# Patient Record
Sex: Female | Born: 1985 | Hispanic: Yes | Marital: Married | Smoking: Former smoker
Health system: Southern US, Community
[De-identification: ages and names within clinical notes are randomized; demographics above are authoritative.]

## PROBLEM LIST (undated history)

## (undated) ENCOUNTER — Inpatient Hospital Stay (HOSPITAL_COMMUNITY): Payer: Self-pay

## (undated) DIAGNOSIS — J45909 Unspecified asthma, uncomplicated: Secondary | ICD-10-CM

## (undated) DIAGNOSIS — I10 Essential (primary) hypertension: Secondary | ICD-10-CM

## (undated) DIAGNOSIS — R51 Headache: Secondary | ICD-10-CM

## (undated) DIAGNOSIS — G2581 Restless legs syndrome: Secondary | ICD-10-CM

## (undated) DIAGNOSIS — D649 Anemia, unspecified: Secondary | ICD-10-CM

## (undated) DIAGNOSIS — F329 Major depressive disorder, single episode, unspecified: Secondary | ICD-10-CM

## (undated) DIAGNOSIS — F419 Anxiety disorder, unspecified: Secondary | ICD-10-CM

## (undated) DIAGNOSIS — R87629 Unspecified abnormal cytological findings in specimens from vagina: Secondary | ICD-10-CM

## (undated) DIAGNOSIS — Z789 Other specified health status: Secondary | ICD-10-CM

## (undated) DIAGNOSIS — T8332XA Displacement of intrauterine contraceptive device, initial encounter: Secondary | ICD-10-CM

## (undated) DIAGNOSIS — J189 Pneumonia, unspecified organism: Secondary | ICD-10-CM

## (undated) DIAGNOSIS — R519 Headache, unspecified: Secondary | ICD-10-CM

## (undated) DIAGNOSIS — F32A Depression, unspecified: Secondary | ICD-10-CM

## (undated) DIAGNOSIS — K219 Gastro-esophageal reflux disease without esophagitis: Secondary | ICD-10-CM

## (undated) HISTORY — DX: Essential (primary) hypertension: I10

## (undated) HISTORY — PX: APPENDECTOMY: SHX54

## (undated) HISTORY — PX: LAPAROSCOPY ABDOMEN DIAGNOSTIC: PRO50

## (undated) HISTORY — PX: IUD REMOVAL: SHX5392

## (undated) HISTORY — DX: Anemia, unspecified: D64.9

## (undated) HISTORY — DX: Headache, unspecified: R51.9

## (undated) HISTORY — DX: Other specified health status: Z78.9

## (undated) HISTORY — PX: COLPOSCOPY W/ BIOPSY / CURETTAGE: SUR283

## (undated) HISTORY — DX: Unspecified abnormal cytological findings in specimens from vagina: R87.629

## (undated) HISTORY — DX: Unspecified asthma, uncomplicated: J45.909

## (undated) HISTORY — DX: Headache: R51

## (undated) HISTORY — PX: NO PAST SURGERIES: SHX2092

---
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
## 2006-11-06 ENCOUNTER — Ambulatory Visit (HOSPITAL_COMMUNITY): Admission: RE | Admit: 2006-11-06 | Discharge: 2006-11-06 | Payer: Self-pay | Admitting: Obstetrics and Gynecology

## 2008-10-05 ENCOUNTER — Other Ambulatory Visit: Admission: RE | Admit: 2008-10-05 | Discharge: 2008-10-05 | Payer: Self-pay | Admitting: Obstetrics and Gynecology

## 2009-04-14 ENCOUNTER — Inpatient Hospital Stay (HOSPITAL_COMMUNITY): Admission: AD | Admit: 2009-04-14 | Discharge: 2009-04-15 | Payer: Self-pay | Admitting: Obstetrics & Gynecology

## 2009-04-22 ENCOUNTER — Ambulatory Visit: Payer: Self-pay | Admitting: Obstetrics & Gynecology

## 2009-04-22 ENCOUNTER — Inpatient Hospital Stay (HOSPITAL_COMMUNITY): Admission: AD | Admit: 2009-04-22 | Discharge: 2009-04-22 | Payer: Self-pay | Admitting: Obstetrics & Gynecology

## 2009-04-23 ENCOUNTER — Ambulatory Visit: Payer: Self-pay | Admitting: Advanced Practice Midwife

## 2009-04-23 ENCOUNTER — Inpatient Hospital Stay (HOSPITAL_COMMUNITY): Admission: AD | Admit: 2009-04-23 | Discharge: 2009-04-24 | Payer: Self-pay | Admitting: Obstetrics & Gynecology

## 2010-01-26 ENCOUNTER — Emergency Department (HOSPITAL_COMMUNITY): Admission: EM | Admit: 2010-01-26 | Discharge: 2010-01-26 | Payer: Self-pay | Admitting: Emergency Medicine

## 2010-09-12 LAB — CBC
HCT: 35.3 % — ABNORMAL LOW (ref 36.0–46.0)
Hemoglobin: 11.7 g/dL — ABNORMAL LOW (ref 12.0–15.0)
MCHC: 33.1 g/dL (ref 30.0–36.0)
MCV: 82.7 fL (ref 78.0–100.0)
Platelets: 217 10*3/uL (ref 150–400)
RBC: 4.27 MIL/uL (ref 3.87–5.11)
RDW: 14.6 % (ref 11.5–15.5)
WBC: 13.7 10*3/uL — ABNORMAL HIGH (ref 4.0–10.5)

## 2010-09-12 LAB — RPR: RPR Ser Ql: NONREACTIVE

## 2010-10-26 NOTE — Op Note (Signed)
NAMEEDLA, PARA NO.:  [id number]   MEDICAL RECORD NO.:  [id number]          PATIENT TYPE:  INP   LOCATION:  LDR1                          FACILITY:  [facility]   PHYSICIAN:  Tilda Burrow, M.D. DATE OF BIRTH:  1985/06/11   DATE OF PROCEDURE:  DATE OF DISCHARGE:                                  PROCEDURE NOTE   DELIVERY SUMMARY:   ONSET OF LABOR:  May 16, 2004 at 9 a.m.   DATE OF DELIVERY:  May 16, 2004 at 1916 p.m.   LENGTH OF FIRST STAGE LABOR:  9 hours and 50 minutes.   LENGTH OF SECOND STAGE LABOR:  26 minutes.   LENGTH OF THIRD STAGE LABOR:  5 minutes.   DELIVERY NOTE:  Coralyn had a spontaneous vaginal delivery of a viable  female infant over an intact perineum.  Upon delivery of head, the infant's  shoulders were easily delivered.  Upon delivery of infant, the infant was  thoroughly suctioned, the cord clamped and cut, and the infant dried and  placed on the mother's abdomen.  Strong tone.  Movement of all extremities.  Pink color noted.  Upon inspection, there was a right labial laceration  which required 1 stitch for hemostasis.  1% lidocaine 2cc was used as  infiltrate.  2-0 Vicryl was used for the repair.  The third stage of labor  was actively managed with 1000 cc of D-5 LR at a rapid rate.  The placenta  was delivered spontaneously via Tomasa Blase mechanism.  A three vessel cord was  noted upon inspection, and membranes were noted to be intact upon  inspection.  Estimated blood loss was 300 cc.  Infant and mother were both  stabilized and transported up to the postpartum unit in stable condition.     Darl   DL/MEDQ  D:  09/81/1914  T:  05/16/2004  Job:  [id number]   cc:   [facility]

## 2010-10-26 NOTE — H&P (Signed)
[name], STEUART NO.:  [id number]   MEDICAL RECORD NO.:  [id number]          PATIENT TYPE:  INP   LOCATION:  A402                          FACILITY:  [facility]   PHYSICIAN:  Tilda Burrow, M.D. DATE OF BIRTH:  1986-05-04   DATE OF ADMISSION:  DATE OF DISCHARGE:  LH                                HISTORY & PHYSICAL   REASON FOR ADMISSION:  Pregnancy at 37 weeks and 5 days in early labor.   HISTORY OF PRESENT ILLNESS:  Andrea Burns is a 25 year old gravida 1 who presents  to the office this morning with spotting and cramping since early a.m. On  the prior exam of December 5, she was closed, long. She is 4, 70, -1.   PAST MEDICAL HISTORY:  Negative.   PAST SURGICAL HISTORY:  Negative.   ALLERGIES:  She has no known allergies.   PHYSICAL EXAMINATION:  Weight 171, blood pressure 118/80. Cervic is 4 cm,  70% effaced, -1 station. Fetal heart rate is 130, strong, and regular.   Prenatal course has been uneventful. Blood type is A positive. Rubella is  immune. Hepatitis B surface antigen negative. HIV negative. HSV is negative  for HSV-2. Serology is nonreactive. GC and Chlamydia is negative. MSAFP is  normal. GBS is negative. Twenty-week hemoglobin 12.3, 28-week hematocrit  37.2.   PLAN:  We are going to admit and expect vaginal delivery.     Darl   DL/MEDQ  D:  16/03/9603  T:  05/16/2004  Job:  [id number]

## 2012-06-10 NOTE — L&D Delivery Note (Signed)
Delivery Note At 8:06 AM a viable female was delivered via Vaginal, Spontaneous Delivery (Presentation: Right Occiput Anterior).  APGAR: 9, 9 .   Placenta status: Intact, Spontaneous.  Cord: 3 vessels with the following complications: none .   Anesthesia: Local  Episiotomy: none Lacerations: Labial Suture Repair: 3.0 vicryl rapide Est. Blood Loss (mL): 200 ml  Mom to postpartum.  Baby to Couplet care / Skin to Skin.  JACKSON-MOORE,Jeronda Don A 05/10/2013, 8:37 AM

## 2012-06-19 ENCOUNTER — Encounter (HOSPITAL_COMMUNITY): Payer: Self-pay | Admitting: *Deleted

## 2012-06-19 ENCOUNTER — Emergency Department (HOSPITAL_COMMUNITY)
Admission: EM | Admit: 2012-06-19 | Discharge: 2012-06-20 | Disposition: A | Discharge status: Home / Self Care | Payer: Medicaid Other | Attending: Emergency Medicine | Admitting: Emergency Medicine

## 2012-06-19 ENCOUNTER — Emergency Department (HOSPITAL_COMMUNITY): Payer: Medicaid Other

## 2012-06-19 DIAGNOSIS — J020 Streptococcal pharyngitis: Secondary | ICD-10-CM

## 2012-06-19 DIAGNOSIS — R059 Cough, unspecified: Secondary | ICD-10-CM | POA: Insufficient documentation

## 2012-06-19 DIAGNOSIS — R05 Cough: Secondary | ICD-10-CM | POA: Insufficient documentation

## 2012-06-19 LAB — RAPID STREP SCREEN (MED CTR MEBANE ONLY): Streptococcus, Group A Screen (Direct): POSITIVE — AB

## 2012-06-19 MED ORDER — ACETAMINOPHEN 325 MG PO TABS
650.0000 mg | ORAL_TABLET | Freq: Once | ORAL | Status: AC
  Administered 2012-06-19: 650 mg via ORAL

## 2012-06-19 MED ORDER — SODIUM CHLORIDE 0.9 % IV BOLUS (SEPSIS): 1000.0000 mL | Freq: Once | INTRAVENOUS | Status: DC

## 2012-06-19 MED ORDER — HYDROCODONE-ACETAMINOPHEN 7.5-500 MG/15ML PO SOLN: 15.0000 mL | Freq: Four times a day (QID) | ORAL | Status: DC | PRN

## 2012-06-19 MED ORDER — PENICILLIN G BENZATHINE 1200000 UNIT/2ML IM SUSP
1.2000 10*6.[IU] | Freq: Once | INTRAMUSCULAR | Status: AC
  Administered 2012-06-20: 1.2 10*6.[IU] via INTRAMUSCULAR
  Filled 2012-06-19: qty 2

## 2012-06-19 MED ORDER — HYDROCODONE-ACETAMINOPHEN 7.5-500 MG/15ML PO SOLN: 10.0000 mL | Freq: Once | ORAL | Status: DC

## 2012-06-19 NOTE — ED Provider Notes (Signed)
History     CSN: [id number]  Arrival date & time 06/19/12  1913   First MD Initiated Contact with Patient 06/19/12 2215      Chief Complaint  Patient presents with  . ill for 2-3 weeks     (Consider location/radiation/quality/duration/timing/severity/associated sxs/prior treatment) HPI  27 year old female presents complaining of fever and cough.  Patient reports for the past 2 weeks she has had persistent cough productive with green sputum. Onset was gradual, persistent, moderate in severity, accompanied with sore throat, fever, chills, and decrease in appetite. She reports no headache, sneezing, runny nose, ear pain, neck pain, shortness of breath, nausea, vomiting, diarrhea, abdominal pain, dysuria, or rash. She has tried over-the-counter cough medication without relief. She endorse pleuritic chest pain from persistent coughing but denies hemoptysis. Patient is a nonsmoker, does not take birth control, no prior history of PE or DVT, no recent surgery, prolonged bed rest, or recent travel. No medication changes.  History reviewed. No pertinent past medical history.  History reviewed. No pertinent past surgical history.  No family history on file.  History  Substance Use Topics  . Smoking status: Never Smoker   . Smokeless tobacco: Not on file  . Alcohol Use: Yes    OB History    Grav Para Term Preterm Abortions TAB SAB Ect Mult Living                  Review of Systems  Constitutional:       10 Systems reviewed and all are negative for acute change except as noted in the HPI.     Allergies  Review of patient's allergies indicates no known allergies.  Home Medications  No current outpatient prescriptions on file.  BP 114/74  Pulse 103  Temp 98.9 F (37.2 C) (Oral)  Resp 20  SpO2 97%  LMP 05/24/2012  Physical Exam  Nursing note and vitals reviewed. Constitutional: She appears well-developed and well-nourished. No distress.       Awake, alert, nontoxic  appearance  HENT:  Head: Atraumatic.  Right Ear: Tympanic membrane and ear canal normal.  Left Ear: Tympanic membrane and ear canal normal.  Nose: Nose normal.  Mouth/Throat: Uvula is midline. Oropharyngeal exudate, posterior oropharyngeal edema and posterior oropharyngeal erythema present. No tonsillar abscesses.  Eyes: Conjunctivae normal are normal. Right eye exhibits no discharge. Left eye exhibits no discharge.  Neck: Neck supple.  Cardiovascular: Normal rate and regular rhythm.   Pulmonary/Chest: Effort normal. No respiratory distress. She exhibits no tenderness.  Abdominal: Soft. There is no tenderness. There is no rebound.  Musculoskeletal: She exhibits no tenderness.       ROM appears intact, no obvious focal weakness  Neurological:       Mental status and motor strength appears intact  Skin: No rash noted.  Psychiatric: She has a normal mood and affect.    ED Course  Procedures (including critical care time)  Results for orders placed during the hospital encounter of 06/19/12  RAPID STREP SCREEN      Component Value Range   Streptococcus, Group A Screen (Direct) POSITIVE (*) NEGATIVE   Dg Chest 2 View  06/19/2012  *RADIOLOGY REPORT*  Clinical Data: Cough for 2 weeks  CHEST - 2 VIEW  Comparison: None  Findings: The heart size and mediastinal contours are within normal limits.  Both lungs are clear.  The visualized skeletal structures are unremarkable.  IMPRESSION: Negative exam.   Original Report Authenticated By: Signa Kell, M.D.  1. Strep pharyngitis  MDM  Patient presents with fever, cough, sore throat. She is otherwise appeared nontoxic and in no acute respiratory distress. She has a temperature of 102.8. Tylenol given. She has an elevated heart rate of 119. Likely secondary to a fever. She also has decreased appetite. Will give IV fluid. Her lung exam is unremarkable. Abdomen is soft and nontender. Throat with post oral pharyngeal erythema and exudate. No  evidence of deep tissue infection. A strep test obtained. Chest x-ray ordered.  11:39 PM cxr without acute finding.  Rapid strep test is positive.  Will treat strep pharyngitis with Pen 1.2mg  IM.  Pt able to tolerates PO.  Will give referral to ENT as needed.    BP 114/74  Pulse 103  Temp 98.9 F (37.2 C) (Oral)  Resp 20  SpO2 97%  LMP 05/24/2012   I have reviewed nursing notes and vital signs. I personally reviewed the imaging tests through PACS system  I reviewed available ER/hospitalization records thought the EMR    Fayrene Helper, [place] 06/19/12 2341

## 2012-06-19 NOTE — ED Notes (Signed)
The pt has been ill for 2-3 weeks cold cough no appetite hurting all over with a temp.  She has not taken anything for  The temp since yesterday aching anc congestion innher posterior chest

## 2012-06-20 NOTE — ED Notes (Signed)
Patient states she has not been feeling well for several days.  Has been running a fever but has not taken anything.

## 2012-06-20 NOTE — ED Notes (Signed)
Patient refused the IV fluid bolus and the Lotab.  Patient was driving and was not going to get the Lortab before she went home.  MD aware

## 2012-06-20 NOTE — ED Notes (Signed)
Patient refused to take any medications but was able to take her into taking the Bicillin

## 2012-06-21 NOTE — ED Provider Notes (Signed)
Medical screening examination/treatment/procedure(s) were performed by non-physician practitioner and as supervising physician I was immediately available for consultation/collaboration.   Suzi Roots, MD 06/21/12 [phone]

## 2012-06-22 ENCOUNTER — Encounter (HOSPITAL_COMMUNITY): Payer: Self-pay

## 2012-06-22 ENCOUNTER — Emergency Department (HOSPITAL_COMMUNITY)
Admission: EM | Admit: 2012-06-22 | Discharge: 2012-06-22 | Disposition: A | Discharge status: Home / Self Care | Payer: Medicaid Other | Source: Home / Self Care

## 2012-06-22 DIAGNOSIS — B009 Herpesviral infection, unspecified: Secondary | ICD-10-CM

## 2012-06-22 DIAGNOSIS — B001 Herpesviral vesicular dermatitis: Secondary | ICD-10-CM

## 2012-06-22 MED ORDER — ACYCLOVIR 400 MG PO TABS: 400.0000 mg | ORAL_TABLET | Freq: Every day | ORAL | Status: DC

## 2012-06-22 NOTE — ED Notes (Signed)
Was in ED 1-11, treated for strep throat, developed sores on lip ; c/o hard to eat or drink ; looks uncomfortable; refused IV bolus in ED 1-11 visit

## 2012-06-22 NOTE — ED Provider Notes (Addendum)
Chief Complaint  Patient presents with  . Mouth Lesions    History of Present Illness:   Andrea Burns is a 27 year old female who presents with fever blisters on her lips since yesterday. They're painful and the lips are cracked and sometimes bleeding. This has been going on since yesterday. She was at the emergency room 2 days ago with a sore throat and fever. She was diagnosed with strep throat, given an injection of Bicillin, and now the fever is down her throat is better. She has had fever blisters on her lips before but they have never been this severe. She denies any nasal congestion, rhinorrhea, sore her mouth, or difficulty swallowing or breathing.  Review of Systems:  Other than noted above, the patient denies any of the following symptoms: Systemic:  No fevers, chills, sweats, weight loss or gain, fatigue, or tiredness. Eye:  No redness, pain, discharge, itching, blurred vision, or diplopia. ENT:  No headache, nasal congestion, sneezing, itching, epistaxis, ear pain, congestion, decreased hearing, ringing in ears, vertigo, or tinnitus.  No oral lesions, sore throat, pain on swallowing, or hoarseness. Neck:  No mass, tenderness or adenopathy. Lungs:  No coughing, wheezing, or shortness of breath. Skin:  No rash or itching.  PMFSH:  Past medical history, family history, social history, meds, and allergies were reviewed.  Physical Exam:   Vital signs:  BP 108/68  Pulse 60  Temp 97.8 F (36.6 C) (Oral)  Resp 18  SpO2 100%  LMP 05/24/2012 General:  Alert and oriented.  In no distress.  Skin warm and dry. Eye:  PERRL, full EOMs, lids and conjunctiva normal.   ENT:  TMs and canals clear.  Nasal mucosa not congested and without drainage.  Mucous membranes moist, no oral lesions, normal dentition, pharynx clear.  No cranial or facial pain to palplation. She has multiple, shallow ulcerations on her upper and lower lips, worse in the lower lip, the lips were chapped, cracked, and  peeling. Neck:  Supple, full ROM.  No adenopathy, tenderness or mass.  Thyroid normal. Lungs:  Breath sounds clear and equal bilaterally.  No wheezes, rales or rhonchi. Heart:  Rhythm regular, without extrasystoles.  No gallops or murmers. Skin:  Clear, warm and dry.  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  Plan:   1.  The following meds were prescribed:   New Prescriptions   ACYCLOVIR (ZOVIRAX) 400 MG TABLET    Take 1 tablet (400 mg total) by mouth 5 (five) times daily.   2.  The patient was instructed in symptomatic care and handouts were given. 3.  The patient was told to return if becoming worse in any way, if no better in 3 or 4 days, and given some red flag symptoms that would indicate earlier return.  Follow up:  The patient was told to follow up here if no better in 3 or 4 days.       Reuben Likes, MD 06/22/12 1430  Reuben Likes, MD 06/22/12 [phone]

## 2012-08-26 ENCOUNTER — Encounter (HOSPITAL_COMMUNITY): Payer: Self-pay | Admitting: *Deleted

## 2012-08-26 ENCOUNTER — Emergency Department (HOSPITAL_COMMUNITY)
Admission: EM | Admit: 2012-08-26 | Discharge: 2012-08-26 | Disposition: A | Discharge status: Home / Self Care | Payer: Medicaid Other | Source: Home / Self Care | Attending: Emergency Medicine | Admitting: Emergency Medicine

## 2012-08-26 DIAGNOSIS — T148 Other injury of unspecified body region: Secondary | ICD-10-CM

## 2012-08-26 DIAGNOSIS — W57XXXA Bitten or stung by nonvenomous insect and other nonvenomous arthropods, initial encounter: Secondary | ICD-10-CM

## 2012-08-26 MED ORDER — TRIAMCINOLONE ACETONIDE 0.1 % EX CREA: TOPICAL_CREAM | Freq: Three times a day (TID) | CUTANEOUS | Status: DC

## 2012-08-26 NOTE — ED Notes (Signed)
Pt  Reports  Symptoms  Of  Rash  On  Arms   And  Legs  Red   And  Raised  The  Lesions   Itch        -        She    Is  In no  Acute  Distress   No  Angioedema   Appears  In no  Acute  Distress

## 2012-08-26 NOTE — ED Provider Notes (Signed)
Chief Complaint:   Chief Complaint  Patient presents with  . Rash    History of Present Illness:   Andrea Burns is a 27 year old female who's had itchy bumps on her arms and legs for the past 6 days. She denies any difficulty breathing or swelling of the tongue or throat. She has not had any obvious bites. There are no animals in her apartment. She denies any bed bugs in her bed.   Review of Systems:  Other than noted above, the patient denies any of the following symptoms: Systemic:  No fever, chills, sweats, weight loss, or fatigue. ENT:  No nasal congestion, rhinorrhea, sore throat, swelling of lips, tongue or throat. Resp:  No cough, wheezing, or shortness of breath. Skin:  No rash, itching, nodules, or suspicious lesions.  PMFSH:  Past medical history, family history, social history, meds, and allergies were reviewed.   Physical Exam:   Vital signs:  BP 105/74  Pulse 75  Temp(Src) 97.7 F (36.5 C) (Oral)  Resp 16  SpO2 100%  LMP 08/14/2012 Gen:  Alert, oriented, in no distress. ENT:  Pharynx clear, no intraoral lesions, moist mucous membranes. Lungs:  Clear to auscultation. Skin:   She has erythematous papules on her arms and legs. Some of these were in a linear distribution.   Assessment:  The encounter diagnosis was Insect bites.   These could be bed bug bites or flea bites.   Plan:   1.  The following meds were prescribed:   Discharge Medication List as of 08/26/2012  2:52 PM    START taking these medications   Details  triamcinolone cream (KENALOG) 0.1 % Apply topically 3 (three) times daily., Starting 08/26/2012, Until Discontinued, Normal       2.  The patient was instructed in symptomatic care and handouts were given. 3.  The patient was told to return if becoming worse in any way, if no better in 3 or 4 days, and given some red flag symptoms such as  worsening rash or difficulty breathing,  that would indicate earlier return.     Reuben Likes,  MD 08/26/12 2119

## 2012-09-17 ENCOUNTER — Ambulatory Visit (INDEPENDENT_AMBULATORY_CARE_PROVIDER_SITE_OTHER): Payer: Medicaid Other | Admitting: Obstetrics & Gynecology

## 2012-09-17 ENCOUNTER — Encounter: Payer: Self-pay | Admitting: Obstetrics & Gynecology

## 2012-09-17 VITALS — BP 123/74 | Temp 98.2°F | Ht 62.0 in | Wt 157.0 lb

## 2012-09-17 DIAGNOSIS — Z3480 Encounter for supervision of other normal pregnancy, unspecified trimester: Secondary | ICD-10-CM | POA: Insufficient documentation

## 2012-09-17 DIAGNOSIS — N390 Urinary tract infection, site not specified: Secondary | ICD-10-CM

## 2012-09-17 DIAGNOSIS — Z3481 Encounter for supervision of other normal pregnancy, first trimester: Secondary | ICD-10-CM

## 2012-09-17 DIAGNOSIS — Z3201 Encounter for pregnancy test, result positive: Secondary | ICD-10-CM

## 2012-09-17 NOTE — Progress Notes (Signed)
.   Subjective:    Andrea Burns is being seen today for her first obstetrical visit.  This is not a planned pregnancy. She is at [redacted]w[redacted]d gestation. Her obstetrical history is significant for no risk factors at this time. Relationship with FOB: significant other, living together. Patient does intend to breast feed. Pregnancy history fully reviewed.  Menstrual History: OB History   Grav Para Term Preterm Abortions TAB SAB Ect Mult Living   4 3 3       3       Menarche age: 58 Patient's last menstrual period was 08/14/2012.     Objective:   No exam today  Assessment:   Early pregnant--intake interview only   Plan:    Initial labs drawn. Prenatal vitamins. Follow up in 4 weeks.

## 2012-09-17 NOTE — Patient Instructions (Signed)
Cuidados prenatales (Prenatal Care) QU SON LOS CUIDADOS PRENATALES?  Cuidados prenatales significan la asistencia de la salud antes de que nazca el beb. Cudese usted y tambin cuide al beb del siguiente modo:   Siga los cuidados prenatales desde comienzos del Psychiatrist. Si sabe que est embarazada o piensa que podra estarlo, comunquese con su mdico lo antes posible. Arregle una visita para un examen general o prenatal.  Siga los cuidados prenatales de [place] regular. Siga el esquema para realizar anlisis de [place] y otras pruebas necesarias y Joelyn [place] a todas las citas.  Si hace todo esto podr [employer] y la del beb durante todo el Psychiatrist.  Los cuidados prenatales deben incluir una evaluacin de las necesidades mdicas, [place], [place], psicolgicas y [place] para la pareja y la historia [place], [place] y gentica de la familia de la madre y el padre.  Comente con su mdico:  Los medicamentos prescriptos, de venta libre y las hierbas medicinales que toma.  Si consume drogas, alcohol o fuma.  Si sufre violencia o abuso familiar.  Las vacunas que ha recibido.  Su nutricin y Surveyor, mining.  La actividad fsica Development worker, international aid.  Peligros ambientales y Forensic psychologist, del hogar y [place].  Historia de enfermedades de transmisin sexual que hayan sufrido usted y Risk analyst.  Embarazos previos. PORQU LOS CUIDADOS PRENATALES SON TAN IMPORTANTES?  Si la controla con regularidad, el profesional tendr la oportunidad de Clinical research associate problemas precozmente, de modo que puede tratarlos lo antes posible. Tambin puede llegar a Photographer. Muchos estudios han demostrado que una atencin prenatal temprana y regular es importante tanto para la salud de las madres como de sus bebs.  Concha Se EMBARAZADA COMO PUEDO CUIDARME?  El cuidado de la mujer antes de quedar embarazada la ayuda a Warehouse manager un Psychiatrist saludable y a Technical sales engineer las posibilidades de tener un beb con  problemas. Estas son las formas de cuidarse antes de quedan embarazada:   Consuma alimentos saludables, realice actividad fsica regularmente (lo ms aconsejable es 30 minutos por da, [employer]) y descanse y duerma lo suficiente. Converse con el profesional acerca de cules son los alimentos y ejercicios que ms le convienen.  Tome 400 microgramos (mcg) de cido flico (una de las vitaminas del grupo B) todos [place]. Lo ms aconsejable es tomar un multivitamnico que contenga esta cantidad de cido flico. Si toma la cantidad suficiente de cido flico sinttico (manufacturado) todos los [place], antes de [place] y a comienzos del Psychiatrist, podr evitar que el beb sufra ciertos defectos. Hay cereales para el desayuno y otros productos que contienen granos a los que se les ha agregado cido flico, pero no todos contienen 400 mcg por porcin. Controle las etiquetas del multivitamnico o del cereal para conocer la cantidad de cido flico que contienen.  Verifique que cuenta con todas las vacunaciones, especialmente la de la [place]. La rubola puede causar graves defectos al beb. La varicela es otra enfermedad que debe evitarse durante el [place]. Si ha sufrido varicela o rubola en el pasado, usted est inmunizada.  Informe al mdico acerca de todos los medicamentos prescriptos, de venta libre o hierbas que toma. Algunos medicamentos no son seguros para tomarlos Academic librarian.  Deje de fumar, de beber alcohol o de tomar drogas. [place] ayuda al mdico, si es necesario.  Comente y trate cualquier problema mdico, social o psicolgico antes de quedar [place].  Comente cualquier historia de problemas genticos en su familia o en la del padre. Siempre  que sea posible, haga pruebas genticas antes de quedar embarazada.  Comente a su mdico si es vctima de maltratos fsicos o emocionales.  Comente con el profesional si est expuesta a sustancias qumicas perjudiciales en su  lugar de trabajo o en el lugar en el que vive.  Comente con el profesional si considera que su trabajo o las horas que trabaja pueden ser perjudiciales y debera modificarlos.  El padre debe estar incluido en todas las tomas de decisiones con todos los aspectos del [place], el [place] de parto y [place].  Si tiene [employer] Home	[place], asegrese que esta cubierta por Psychiatrist. RECIN ME ENTERO QUE ESTOY EMBARAZADA COMO PUEDO CUIDARME?  Aqu hay algunas indicaciones para cuidarse usted misma y la preciosa nueva vida que crece en su interior.   Contine tomando el multivitamnico con 400 microgramos (mcg) de cido [employer].  Siga los cuidados prenatales desde comienzos del embarazo. No importa si este es Contractor o si ya tiene otros nios. Es importante que consulte con un profesional durante el Psychiatrist. El Facilities manager en cada visita para verificar que usted y el beb estn sanos. Si hubiera problemas, tomar medidas para ayudarla a usted o a su beb.  Consuma una dieta saludable que incluya.  Frutas  Vegetales  Alimentos que contengan pocas grasas saturadas.  Granos.  Alimentos ricos en calcio.  Beba entre 6 y 8 vasos de lquidos por Futures trader.  Excepto que el profesional le indique lo contrario, trate de mantenerse fsicamente activa durante 30 minutos, casi todos los [address]  [address] de la [place]. Si no tiene tiempo, realice [place] en segmentos de 10 minutos, tres Teacher, early years/pre.  Si fuma, bebe alcohol o consume drogas DEJE DE HACERLO. Estas sustancias pueden causar daos crnicos al beb. Converse con el profesional que la asiste con respecto a los pasos a seguir para International aid/development worker. Converse con algn miembro de la comunidad religiosa, terapeuta, un amigo de confianza o su mdico, si est preocupada con respecto a su consumo de alcohol o drogas.  Consulte con el profesional antes de tomar [employer], an los de [employer]. Algunos medicamentos no son  seguros para tomarlos Academic librarian.  Descanse y duerma lo suficiente.  Evite jacuzzis y saunas durante el embarazo  No pueden tomarle radiografas, excepto que sea absolutamente necesario y con autorizacin del mdico. Le colocarn una pantalla de plomo sobre el abdomen para proteger al beb cuando los rayos x ingresen a su organismo  No limpie el lecho del gato si est embarazada. Puede contener un parsito que causa una infeccin denominada toxoplasmosis, que ocasiona defectos en el beb. Tambin utilice guantes cuando trabaje en las zonas del jardn en las que se encuentra el [place].  No coma carne, pescado o queso crudo o mal cocido.  Permanezca alejada de sustancias txicas como:  Insecticidas.  Solventes (como algunos limpiadores o disolvente de pinturas).  Plomo.  Mercurio.  Puede tener relaciones sexuales hasta el final del embarazo excepto que sufra algn problema mdico o tenga alguna dificultad en el embarazo y su mdico le aconseje que no las tenga.  No use tacones altos, especialmente durante la segunda mitad del [place]. Puede perder el equilibrio y caerse.  No haga viajes largos excepto que sea absolutamente necesario. Asegrese de [employer] visita al mdico antes de hacer el viaje.  No permanezca sentada en una posicin durante ms de 2 horas cuando viaje.  Lleve una copia de los registros mdicos cuando  vaya de viaje.  Averige dnde [facility] hospital en la ciudad que visitar, en caso de emergencia.  Los productos de limpieza ms peligrosos tienen advertencias para la mujer embarazada en la etiqueta. Consulte con el profesional con respecto a los productos si no est segura.  Limite o elimine su consumo de cafena proveniente del caf, t, gaseosas, medicamentos y chocolate.  Muchas mujeres siguen trabajando Academic librarian. Permanecer activa la ayuda a mantenerse sana. Si tiene preguntas relacionadas con la seguridad o las horas que trabaja,  consltelo con el profesional que la asiste.  Mantngase informada:  Lea libros.  Mire vdeos.  Concurra con el padre a las clases de preparto.  Converse con otras mams experimentadas.  Consulte con el profesional con respecto a las clases de preparto para usted y Camera operator. Las clases la ayudarn a usted y su compaero a prepararse para el nacimiento de su beb.  Busque un pediatra (mdico de bebs) y Civil engineer, contracting de los mtodos para Acupuncturist dolor durante el [place] de [place], [place] parto y la probabilidad de Neomia Dear cesrea. NO PIENSO QUEDAR EMBARAZADA TODAVA, PERO HE ESCUCHADO QUE TODAS LAS MUJERES DEBEN TOMAR CIDO FLICO TODOS LOS DAS.  Todas las [employer] en edad frtil, an aquellas que tengan una posibilidad [place] de quedar embarazadas, deben tratar de tomar una cantidad suficiente de cido flico Muchos embarazos no son planeados. Muchas mujeres no saben que estn embarazadas y ciertos defectos congnitos se producen a comienzos del Psychiatrist. Tomar 400 microgramos (mcg) de cido flico todos los [employer] ayudara a Automotive engineer ciertos defectos congnitos que se producen a comienzos del Psychiatrist. Si una mujer comienza a tomar vitaminas en el segundo o tercer mes del [place], podra ser muy tarde para evitar algunos defectos congnitos. El cido flico puede tener otros efectos beneficiosos en la salud de la [place], [place] de prevenir defectos cngnitos.  CON QU FRECUENCIA DEBO VISITAR AL MDICO DURANTE EL EMBARAZO?  El Nurse, mental health sus visitas prenatales. A medida que est ms cerca del final del embarazo, las visitas sern ms frecuentes. Un embarazo promedio dura alrededor de 40 semanas.  Una programacin de visitas tpica consiste en:   Aproximadamente una por mes durante los primeros seis meses de [place].  Cada dos [employer] meses siguientes.  Una vez por semana en el ltimo mes, hasta la fecha de [place]. El profesional querr verla ms a menudo.  Si tiene  mas de 35 aos de edad.  Su embarazo es de alto riesgo debido a que usted tiene Therapist, sports de salud o problemas con el embarazo como:  Diabetes.  Presin arterial elevada.  El beb no se desarrolla segn las pautas, de acuerdo con la fecha del Psychiatrist. En estos casos la sometern a pruebas especiales para verificar que ni usted ni el beb tengan problemas graves. QU SUCEDE DURANTE LAS VISITAS PRENATALES?   En su primera visita, el profesional conversar con usted y su compaero acerca de la historia clnica de ambos y de sus respectivas familias, y Civil engineer, contracting un examen fsico.  En su primera visita, el examen mdico incluir controles de la presin arterial, el peso y la altura y un examen de los rganos plvicos. El mdico har un [place] si no tiene uno reciente, y har cultivos del cuello del tero para descartar infecciones.  En cada visita deber realizar anlisis de sangre, orina, tomarse la presin arterial, controlar el peso y verificar el progreso del beb.  El profesional podr decirle cuando  es la fecha probable en la que el beb nacer.  En cada visita tambin tendr la posibilidad de aprender a mantenerse saludable durante el embarazo y podr hacer preguntas.  Comente con su mdico si est decidida a amamantar.  El Hospital doctor como se desarrolla el beb. Le realizarn varios tipos de anlisis a medida que el Financial planner.  Le tomarn ecografas para controlar el desarrollo y la salud del beb.  Podrn solicitarle otros anlisis de sangre si es necesario. Podr incluir una amniocentesis (examen del lquido amnitico), pruebas de estrs (controla como responde el beb a las contracciones), el perfil biofsico (mediciones del feto). El Airline pilot acerca de los [place] y porqu son necesarios. ESTOY POR CUMPLIR 40 AOS Y QUIERO TENER UN HIJO DEBO TOMAR PRECAUCIONES ESPECIALES?  A medida que una mujer envejece, tiene ms  probabilidades de tener problemas mdicos (hipertensin arterial), problemas del embarazo (preeclampsia, problemas con la placenta), aborto espontneo o que el nio nazca con un defecto congnito. Sin embargo, la [employer] de las mujeres a final de la dcada de los 30 aos o a comienzo de los 40, tienen bebs sanos. Consulte con el profesional de [place] regular antes de [place] y Clinical biochemist todos los exmenes que se le solicitarn durante el [place]. El profesional probablemente querr que se someta a algunas pruebas especiales para usted y su beb.  Actualmente las mujeres postergan la decisin de tener hijos hasta cuando tienen treinta o [place]. Aunque muchas mujeres de esta edad no tienen dificultad para quedar embarazadas, a medida que se envejece, la fertilidad declina. Las mujeres de ms de 40 aos que no pueden quedar embarazadas luego de intentarlo durante 6 meses, debern someterse a una evaluacin de su fertilidad. No es poco frecuente tener problemas para quedar embarazada o sufrir infertilidad (imposibilidad para quedar embarazada luego de tratar durante un ao). Si cree que usted o su compaero podran ser infrtiles, comntelo con el profesional que la asiste, quin puede recomendarle tratamientos con medicamentos, [place] o tecnologa para la reproduccin asistida.  Document Released: 11/13/2007 Document Revised: 08/19/2011 [facility] Patient Information 2013 [place], [place].

## 2012-09-18 LAB — VITAMIN D 25 HYDROXY (VIT D DEFICIENCY, FRACTURES): Vit D, 25-Hydroxy: 23 ng/mL — ABNORMAL LOW (ref 30–89)

## 2012-09-18 LAB — HIV ANTIBODY (ROUTINE TESTING W REFLEX): HIV: NONREACTIVE

## 2012-09-20 LAB — CULTURE, OB URINE: Colony Count: >=100000

## 2012-09-21 ENCOUNTER — Encounter: Payer: Self-pay | Admitting: Obstetrics & Gynecology

## 2012-09-21 DIAGNOSIS — Z22330 Carrier of Group B streptococcus: Secondary | ICD-10-CM | POA: Insufficient documentation

## 2012-09-21 DIAGNOSIS — R8271 Bacteriuria: Secondary | ICD-10-CM | POA: Insufficient documentation

## 2012-09-21 DIAGNOSIS — O99891 Other specified diseases and conditions complicating pregnancy: Secondary | ICD-10-CM | POA: Insufficient documentation

## 2012-09-21 DIAGNOSIS — O9989 Other specified diseases and conditions complicating pregnancy, childbirth and the puerperium: Secondary | ICD-10-CM

## 2012-09-21 LAB — OBSTETRIC PANEL
Antibody Screen: NEGATIVE
Basophils Absolute: 0 10*3/uL (ref 0.0–0.1)
Basophils Relative: 0 % (ref 0–1)
Eosinophils Absolute: 0.1 10*3/uL (ref 0.0–0.7)
Eosinophils Relative: 1 % (ref 0–5)
HCT: 36.7 % (ref 36.0–46.0)
Hemoglobin: 12.5 g/dL (ref 12.0–15.0)
Hepatitis B Surface Ag: NEGATIVE
Lymphocytes Relative: 16 % (ref 12–46)
Lymphs Abs: 1.6 10*3/uL (ref 0.7–4.0)
MCH: 29.7 pg (ref 26.0–34.0)
MCHC: 34.1 g/dL (ref 30.0–36.0)
MCV: 87.2 fL (ref 78.0–100.0)
Monocytes Absolute: 0.8 10*3/uL (ref 0.1–1.0)
Monocytes Relative: 8 % (ref 3–12)
Neutro Abs: 7.9 10*3/uL — ABNORMAL HIGH (ref 1.7–7.7)
Neutrophils Relative %: 75 % (ref 43–77)
Platelets: 269 10*3/uL (ref 150–400)
RBC: 4.21 MIL/uL (ref 3.87–5.11)
RDW: 12.9 % (ref 11.5–15.5)
Rh Type: POSITIVE
Rubella: 1.52 Index — ABNORMAL HIGH (ref <0.90)
WBC: 10.4 10*3/uL (ref 4.0–10.5)

## 2012-09-21 LAB — HEMOGLOBINOPATHY EVALUATION
Hemoglobin Other: 0 %
Hgb A2 Quant: 2.5 % (ref 2.2–3.2)
Hgb A: 97.5 % (ref 96.8–97.8)
Hgb F Quant: 0 % (ref 0.0–2.0)
Hgb S Quant: 0 %

## 2012-09-21 LAB — VARICELLA ZOSTER ANTIBODY, IGG: Varicella IgG: 951.9 Index — ABNORMAL HIGH (ref <135.00)

## 2012-09-21 MED ORDER — SULFAMETHOXAZOLE-TRIMETHOPRIM 800-160 MG PO TABS: 1.0000 | ORAL_TABLET | Freq: Two times a day (BID) | ORAL | Status: DC

## 2012-09-21 NOTE — Addendum Note (Signed)
Addended by: Elby Beck F on: 09/21/2012 05:42 PM   Modules accepted: Orders

## 2012-10-15 ENCOUNTER — Other Ambulatory Visit: Payer: Self-pay | Admitting: Obstetrics & Gynecology

## 2012-10-15 ENCOUNTER — Encounter: Payer: Self-pay | Admitting: Obstetrics & Gynecology

## 2012-10-15 ENCOUNTER — Ambulatory Visit (INDEPENDENT_AMBULATORY_CARE_PROVIDER_SITE_OTHER): Payer: Medicaid Other | Admitting: Obstetrics & Gynecology

## 2012-10-15 VITALS — BP 116/77 | Temp 98.5°F | Wt 153.0 lb

## 2012-10-15 DIAGNOSIS — Z3480 Encounter for supervision of other normal pregnancy, unspecified trimester: Secondary | ICD-10-CM

## 2012-10-15 DIAGNOSIS — Z3682 Encounter for antenatal screening for nuchal translucency: Secondary | ICD-10-CM

## 2012-10-15 DIAGNOSIS — Z113 Encounter for screening for infections with a predominantly sexual mode of transmission: Secondary | ICD-10-CM

## 2012-10-15 LAB — POCT URINALYSIS DIPSTICK
Bilirubin, UA: NEGATIVE
Blood, UA: NEGATIVE
Glucose, UA: NEGATIVE
Ketones, UA: NEGATIVE
Nitrite, UA: NEGATIVE
Spec Grav, UA: 1.01
Urobilinogen, UA: NEGATIVE
pH, UA: 8

## 2012-10-15 NOTE — Progress Notes (Signed)
P 75 Patient reports she was vomiting and thinks she may have pulled a muscle- c/o pain R side. Pt also states she was treated for UTI- and that helped the vaginal symptoms that she was having.

## 2012-10-15 NOTE — Progress Notes (Signed)
Informal U/S: CRL [redacted]w[redacted]d; cardiac activity

## 2012-10-15 NOTE — Patient Instructions (Signed)
Pregnancy - First Trimester During sexual intercourse, millions of sperm go into the vagina. Only 1 sperm will penetrate and fertilize the female egg while it is in the Fallopian tube. One week later, the fertilized egg implants into the wall of the uterus. An embryo begins to develop into a baby. At 6 to 8 weeks, the eyes and face are formed and the heartbeat can be seen on ultrasound. At the end of 12 weeks (first trimester), all the baby's organs are formed. Now that you are pregnant, you will want to do everything you can to have a healthy baby. Two of the most important things are to get good prenatal care and follow your caregiver's instructions. Prenatal care is all the medical care you receive before the baby's birth. It is given to prevent, find, and treat problems during the pregnancy and childbirth. PRENATAL EXAMS  During prenatal visits, your weight, blood pressure and urine are checked. This is done to make sure you are healthy and progressing normally during the pregnancy.  A pregnant woman should gain 25 to 35 pounds during the pregnancy. However, if you are over weight or underweight, your caregiver will advise you regarding your weight.  Your caregiver will ask and answer questions for you.  Blood work, cervical cultures, other necessary tests and a Pap test are done during your prenatal exams. These tests are done to check on your health and the probable health of your baby. Tests are strongly recommended and done for HIV with your permission. This is the virus that causes AIDS. These tests are done because medications can be given to help prevent your baby from being born with this infection should you have been infected without knowing it. Blood work is also used to find out your blood type, previous infections and follow your blood levels (hemoglobin).  Low hemoglobin (anemia) is common during pregnancy. Iron and vitamins are given to help prevent this. Later in the pregnancy, blood  tests for diabetes will be done along with any other tests if any problems develop. You may need tests to make sure you and the baby are doing well.  You may need other tests to make sure you and the baby are doing well. CHANGES DURING THE FIRST TRIMESTER (THE FIRST 3 MONTHS OF PREGNANCY) Your body goes through many changes during pregnancy. They vary from person to person. Talk to your caregiver about changes you notice and are concerned about. Changes can include:  Your menstrual period stops.  The egg and sperm carry the genes that determine what you look like. Genes from you and your partner are forming a baby. The female genes determine whether the baby is a boy or a girl.  Your body increases in girth and you may feel bloated.  Feeling sick to your stomach (nauseous) and throwing up (vomiting). If the vomiting is uncontrollable, call your caregiver.  Your breasts will begin to enlarge and become tender.  Your nipples may stick out more and become darker.  The need to urinate more. Painful urination may mean you have a bladder infection.  Tiring easily.  Loss of appetite.  Cravings for certain kinds of food.  At first, you may gain or lose a couple of pounds.  You may have changes in your emotions from day to day (excited to be pregnant or concerned something may go wrong with the pregnancy and baby).  You may have more vivid and strange dreams. HOME CARE INSTRUCTIONS   It is very important   to avoid all smoking, alcohol and un-prescribed drugs during your pregnancy. These affect the formation and growth of the baby. Avoid chemicals while pregnant to ensure the delivery of a healthy infant.  Start your prenatal visits by the 12th week of pregnancy. They are usually scheduled monthly at first, then more often in the last 2 months before delivery. Keep your caregiver's appointments. Follow your caregiver's instructions regarding medication use, blood and lab tests, exercise, and  diet.  During pregnancy, you are providing food for you and your baby. Eat regular, well-balanced meals. Choose foods such as meat, fish, milk and other low fat dairy products, vegetables, fruits, and whole-grain breads and cereals. Your caregiver will tell you of the ideal weight gain.  You can help morning sickness by keeping soda crackers at the bedside. Eat a couple before arising in the morning. You may want to use the crackers without salt on them.  Eating 4 to 5 small meals rather than 3 large meals a day also may help the nausea and vomiting.  Drinking liquids between meals instead of during meals also seems to help nausea and vomiting.  A physical sexual relationship may be continued throughout pregnancy if there are no other problems. Problems may be early (premature) leaking of amniotic fluid from the membranes, vaginal bleeding, or belly (abdominal) pain.  Exercise regularly if there are no restrictions. Check with your caregiver or physical therapist if you are unsure of the safety of some of your exercises. Greater weight gain will occur in the last 2 trimesters of pregnancy. Exercising will help:  Control your weight.  Keep you in shape.  Prepare you for labor and delivery.  Help you lose your pregnancy weight after you deliver your baby.  Wear a good support or jogging bra for breast tenderness during pregnancy. This may help if worn during sleep too.  Ask when prenatal classes are available. Begin classes when they are offered.  Do not use hot tubs, steam rooms or saunas.  Wear your seat belt when driving. This protects you and your baby if you are in an accident.  Avoid raw meat, uncooked cheese, cat litter boxes and soil used by cats throughout the pregnancy. These carry germs that can cause birth defects in the baby.  The first trimester is a good time to visit your dentist for your dental health. Getting your teeth cleaned is OK. Use a softer toothbrush and brush  gently during pregnancy.  Ask for help if you have financial, counseling or nutritional needs during pregnancy. Your caregiver will be able to offer counseling for these needs as well as refer you for other special needs.  Do not take any medications or herbs unless told by your caregiver.  Inform your caregiver if there is any mental or physical domestic violence.  Make a list of emergency phone numbers of family, friends, hospital, and police and fire departments.  Write down your questions. Take them to your prenatal visit.  Do not douche.  Do not cross your legs.  If you have to stand for long periods of time, rotate you feet or take small steps in a circle.  You may have more vaginal secretions that may require a sanitary pad. Do not use tampons or scented sanitary pads. MEDICATIONS AND DRUG USE IN PREGNANCY  Take prenatal vitamins as directed. The vitamin should contain 1 milligram of folic acid. Keep all vitamins out of reach of children. Only a couple vitamins or tablets containing iron may be   fatal to a baby or young child when ingested.  Avoid use of all medications, including herbs, over-the-counter medications, not prescribed or suggested by your caregiver. Only take over-the-counter or prescription medicines for pain, discomfort, or fever as directed by your caregiver. Do not use aspirin, ibuprofen, or naproxen unless directed by your caregiver.  Let your caregiver also know about herbs you may be using.  Alcohol is related to a number of birth defects. This includes fetal alcohol syndrome. All alcohol, in any form, should be avoided completely. Smoking will cause low birth rate and premature babies.  Street or illegal drugs are very harmful to the baby. They are absolutely forbidden. A baby born to an addicted mother will be addicted at birth. The baby will go through the same withdrawal an adult does.  Let your caregiver know about any medications that you have to take  and for what reason you take them. MISCARRIAGE IS COMMON DURING PREGNANCY A miscarriage does not mean you did something wrong. It is not a reason to worry about getting pregnant again. Your caregiver will help you with questions you may have. If you have a miscarriage, you may need minor surgery. SEEK MEDICAL CARE IF:  You have any concerns or worries during your pregnancy. It is better to call with your questions if you feel they cannot wait, rather than worry about them. SEEK IMMEDIATE MEDICAL CARE IF:   An unexplained oral temperature above 102 F (38.9 C) develops, or as your caregiver suggests.  You have leaking of fluid from the vagina (birth canal). If leaking membranes are suspected, take your temperature and inform your caregiver of this when you call.  There is vaginal spotting or bleeding. Notify your caregiver of the amount and how many pads are used.  You develop a bad smelling vaginal discharge with a change in the color.  You continue to feel sick to your stomach (nauseated) and have no relief from remedies suggested. You vomit blood or coffee ground-like materials.  You lose more than 2 pounds of weight in 1 week.  You gain more than 2 pounds of weight in 1 week and you notice swelling of your face, hands, feet, or legs.  You gain 5 pounds or more in 1 week (even if you do not have swelling of your hands, face, legs, or feet).  You get exposed to German measles and have never had them.  You are exposed to fifth disease or chickenpox.  You develop belly (abdominal) pain. Round ligament discomfort is a common non-cancerous (benign) cause of abdominal pain in pregnancy. Your caregiver still must evaluate this.  You develop headache, fever, diarrhea, pain with urination, or shortness of breath.  You fall or are in a car accident or have any kind of trauma.  There is mental or physical violence in your home. Document Released: 05/21/2001 Document Revised: 08/19/2011  Document Reviewed: 11/22/2008 Exit[employer] Patient Information 2013 [employer].  

## 2012-10-16 LAB — WET PREP BY MOLECULAR PROBE
Candida species: NEGATIVE
Gardnerella vaginalis: POSITIVE — AB
Trichomonas vaginosis: NEGATIVE

## 2012-10-19 ENCOUNTER — Encounter: Payer: Self-pay | Admitting: Obstetrics & Gynecology

## 2012-10-19 DIAGNOSIS — R87612 Low grade squamous intraepithelial lesion on cytologic smear of cervix (LGSIL): Secondary | ICD-10-CM | POA: Insufficient documentation

## 2012-10-19 LAB — PAP IG, CT-NG NAA, HPV HIGH-RISK
Chlamydia Probe Amp: NEGATIVE
GC Probe Amp: NEGATIVE
HPV DNA High Risk: NOT DETECTED

## 2012-11-11 ENCOUNTER — Other Ambulatory Visit: Payer: Self-pay

## 2012-11-11 ENCOUNTER — Ambulatory Visit (HOSPITAL_COMMUNITY)
Admission: RE | Admit: 2012-11-11 | Discharge: 2012-11-11 | Disposition: A | Discharge status: Home / Self Care | Payer: Medicaid Other | Source: Ambulatory Visit | Attending: Obstetrics & Gynecology | Admitting: Obstetrics & Gynecology

## 2012-11-11 ENCOUNTER — Encounter (HOSPITAL_COMMUNITY): Payer: Self-pay

## 2012-11-11 DIAGNOSIS — Z3682 Encounter for antenatal screening for nuchal translucency: Secondary | ICD-10-CM

## 2012-11-11 DIAGNOSIS — Z3689 Encounter for other specified antenatal screening: Secondary | ICD-10-CM | POA: Insufficient documentation

## 2012-11-11 DIAGNOSIS — O3510X0 Maternal care for (suspected) chromosomal abnormality in fetus, unspecified, not applicable or unspecified: Secondary | ICD-10-CM | POA: Insufficient documentation

## 2012-11-11 DIAGNOSIS — O351XX0 Maternal care for (suspected) chromosomal abnormality in fetus, not applicable or unspecified: Secondary | ICD-10-CM | POA: Insufficient documentation

## 2012-11-11 NOTE — Progress Notes (Signed)
[facility] ultrasound  Indication: 27 yr old G1P3003 at [redacted]w[redacted]d for first trimester screen.  Findings: 1. Single intrauterine pregnancy. 2. Fetal crown rump length is consistent with dating. 3. Normal uterus; no adnexal masses seen. 4. Evaluation of fetal anatomy is limited by early gestational age. 5. Nuchal translucency is at the top end of normal at 2.63mm. 6. The nasal bone is visualized.  Recommendations: 1. First trimester screen done today. Discussed limitations in detecting fetal aneuploidy. - discussed nuchal translucency is at top end of normal and that this may increase the risk for fetal aneuploidy; recommend await first trimester screen results 2. Recommend maternal serum AFP at 15-[redacted] weeks gestation. 3. Recommend fetal anatomic survey at 18-[redacted] weeks gestation.  Eulis Foster, MD

## 2012-11-25 ENCOUNTER — Ambulatory Visit (INDEPENDENT_AMBULATORY_CARE_PROVIDER_SITE_OTHER): Payer: Medicaid Other | Admitting: Obstetrics & Gynecology

## 2012-11-25 VITALS — BP 113/78 | Temp 98.1°F | Wt 160.0 lb

## 2012-11-25 DIAGNOSIS — Z3482 Encounter for supervision of other normal pregnancy, second trimester: Secondary | ICD-10-CM

## 2012-11-25 DIAGNOSIS — Z3480 Encounter for supervision of other normal pregnancy, unspecified trimester: Secondary | ICD-10-CM

## 2012-11-25 LAB — POCT URINALYSIS DIPSTICK
Bilirubin, UA: NEGATIVE
Blood, UA: NEGATIVE
Glucose, UA: NEGATIVE
Ketones, UA: NEGATIVE
Leukocytes, UA: NEGATIVE
Nitrite, UA: NEGATIVE
Protein, UA: NEGATIVE
Spec Grav, UA: 1.015
Urobilinogen, UA: NEGATIVE
pH, UA: 6

## 2012-11-25 NOTE — Progress Notes (Signed)
Pulse: 83

## 2012-11-27 LAB — URINE CULTURE: Colony Count: 30000

## 2012-11-29 ENCOUNTER — Encounter: Payer: Self-pay | Admitting: Obstetrics & Gynecology

## 2012-11-29 NOTE — Patient Instructions (Signed)
Pregnancy - Second Trimester The second trimester is the period between 13 to 27 weeks of your pregnancy. It is important to follow your doctor's instructions. HOME CARE   Do not smoke.  Do not drink alcohol or use drugs.  Only take medicine as told by your doctor.  Take prenatal vitamins as told. The vitamin should contain 1 milligram of folic acid.  Exercise.  Eat healthy foods. Eat regular, well-balanced meals.  You can have sex (intercourse) if there are no other problems with the pregnancy.  Do not use hot tubs, steam rooms, or saunas.  Wear a seat belt while driving.  Avoid raw meat, uncooked cheese, and litter boxes and soil used by cats.  Visit your dentist. Cleanings are okay. GET HELP RIGHT AWAY IF:   You have a temperature by mouth above 102 F (38.9 C), not controlled by medicine.  Fluid is coming from your vagina.  Blood is coming from your vagina. Light spotting is common, especially after sex (intercourse).  You have a bad smelling fluid (discharge) coming from the vagina. The fluid changes from clear to white.  You still feel sick to your stomach (nauseous).  You throw up (vomit) blood.  You lose or gain more than 2 pounds (0.9 kilograms) of weight in a week, or as suggested by your doctor.  Your face, hands, feet, or legs get puffy (swell).  You get exposed to German measles and have never had them.  You get exposed to fifth disease or chickenpox.  You have belly (abdominal) pain.  You have a bad headache that will not go away.  You have watery poop (diarrhea), pain when you pee (urinate), or have shortness of breath.  You start to have problems seeing (blurry or double vision).  You fall, are in a car accident, or have any kind of trauma.  There is mental or physical violence at home.  You have any concerns or worries during your pregnancy. MAKE SURE YOU:   Understand these instructions.  Will watch your condition.  Will get help  right away if you are not doing well or get worse. Document Released: 08/21/2009 Document Revised: 08/19/2011 Document Reviewed: 08/21/2009 [employer] Patient Information 2014 [employer].  

## 2012-11-29 NOTE — Progress Notes (Signed)
Doing well 

## 2012-12-12 ENCOUNTER — Encounter (HOSPITAL_COMMUNITY): Payer: Self-pay | Admitting: Emergency Medicine

## 2012-12-12 ENCOUNTER — Emergency Department (HOSPITAL_COMMUNITY)
Admission: EM | Admit: 2012-12-12 | Discharge: 2012-12-13 | Disposition: A | Discharge status: Home / Self Care | Payer: Medicaid Other | Attending: Emergency Medicine | Admitting: Emergency Medicine

## 2012-12-12 DIAGNOSIS — Z87440 Personal history of urinary (tract) infections: Secondary | ICD-10-CM | POA: Insufficient documentation

## 2012-12-12 DIAGNOSIS — O9989 Other specified diseases and conditions complicating pregnancy, childbirth and the puerperium: Secondary | ICD-10-CM | POA: Insufficient documentation

## 2012-12-12 DIAGNOSIS — Z87891 Personal history of nicotine dependence: Secondary | ICD-10-CM | POA: Insufficient documentation

## 2012-12-12 DIAGNOSIS — O26899 Other specified pregnancy related conditions, unspecified trimester: Secondary | ICD-10-CM

## 2012-12-12 DIAGNOSIS — R109 Unspecified abdominal pain: Secondary | ICD-10-CM | POA: Insufficient documentation

## 2012-12-12 LAB — CBC WITH DIFFERENTIAL/PLATELET
Basophils Absolute: 0 10*3/uL (ref 0.0–0.1)
Basophils Relative: 0 % (ref 0–1)
Eosinophils Absolute: 0.1 10*3/uL (ref 0.0–0.7)
Eosinophils Relative: 1 % (ref 0–5)
HCT: 35.1 % — ABNORMAL LOW (ref 36.0–46.0)
Hemoglobin: 12.4 g/dL (ref 12.0–15.0)
Lymphocytes Relative: 23 % (ref 12–46)
Lymphs Abs: 2 10*3/uL (ref 0.7–4.0)
MCH: 29.9 pg (ref 26.0–34.0)
MCHC: 35.3 g/dL (ref 30.0–36.0)
MCV: 84.6 fL (ref 78.0–100.0)
Monocytes Absolute: 0.6 10*3/uL (ref 0.1–1.0)
Monocytes Relative: 7 % (ref 3–12)
Neutro Abs: 6.2 10*3/uL (ref 1.7–7.7)
Neutrophils Relative %: 70 % (ref 43–77)
Platelets: 218 10*3/uL (ref 150–400)
RBC: 4.15 MIL/uL (ref 3.87–5.11)
RDW: 12.7 % (ref 11.5–15.5)
WBC: 8.9 10*3/uL (ref 4.0–10.5)

## 2012-12-12 LAB — URINALYSIS, ROUTINE W REFLEX MICROSCOPIC
Bilirubin Urine: NEGATIVE
Glucose, UA: NEGATIVE mg/dL
Hgb urine dipstick: NEGATIVE
Ketones, ur: NEGATIVE mg/dL
Nitrite: NEGATIVE
Protein, ur: NEGATIVE mg/dL
Specific Gravity, Urine: 1.022 (ref 1.005–1.030)
Urobilinogen, UA: 1 mg/dL (ref 0.0–1.0)
pH: 6 (ref 5.0–8.0)

## 2012-12-12 LAB — COMPREHENSIVE METABOLIC PANEL
ALT: 22 U/L (ref 0–35)
AST: 22 U/L (ref 0–37)
Albumin: 2.9 g/dL — ABNORMAL LOW (ref 3.5–5.2)
Alkaline Phosphatase: 45 U/L (ref 39–117)
BUN: 6 mg/dL (ref 6–23)
CO2: 25 mEq/L (ref 19–32)
Calcium: 9.3 mg/dL (ref 8.4–10.5)
Chloride: 104 mEq/L (ref 96–112)
Creatinine, Ser: 0.51 mg/dL (ref 0.50–1.10)
GFR calc Af Amer: >90 mL/min (ref >90)
GFR calc non Af Amer: >90 mL/min (ref >90)
Glucose, Bld: 93 mg/dL (ref 70–99)
Potassium: 3.4 mEq/L — ABNORMAL LOW (ref 3.5–5.1)
Sodium: 135 mEq/L (ref 135–145)
Total Bilirubin: 0.2 mg/dL — ABNORMAL LOW (ref 0.3–1.2)
Total Protein: 6.6 g/dL (ref 6.0–8.3)

## 2012-12-12 LAB — LIPASE, BLOOD: Lipase: 45 U/L (ref 11–59)

## 2012-12-12 LAB — WET PREP, GENITAL
Trich, Wet Prep: NONE SEEN
Yeast Wet Prep HPF POC: NONE SEEN

## 2012-12-12 LAB — URINE MICROSCOPIC-ADD ON

## 2012-12-12 LAB — POCT PREGNANCY, URINE: Preg Test, Ur: POSITIVE — AB

## 2012-12-12 LAB — HCG, QUANTITATIVE, PREGNANCY: hCG, Beta Chain, Quant, S: 38177 m[IU]/mL — ABNORMAL HIGH (ref <5)

## 2012-12-12 NOTE — ED Provider Notes (Signed)
History    CSN: [id number] Arrival date & time 12/12/12  2048  First MD Initiated Contact with Patient 12/12/12 2300     Chief Complaint  Patient presents with  . Abdominal Pain   (Consider location/radiation/quality/duration/timing/severity/associated sxs/prior Treatment) Patient is a 27 y.o. female presenting with abdominal pain.  Abdominal Pain Associated symptoms include abdominal pain.   Pt is G4P3 at approx 17wks by US done at [facility] office last month reports moderate aching lower abdominal pain and nausea earlier today. Improved at present, not associated with vomiting, diarrhea, dysuria or vaginal bleeding or discharge. She was treated last week for UTI and finished Abx. She reports vague breathing problems and arm heaviness earlier today she associated with being 'stressed out' but no chest pain, fever, palpitations. Those symptoms have resolved.   Past Medical History  Diagnosis Date  . Medical history non-contributory    Past Surgical History  Procedure Laterality Date  . No past surgeries     Family History  Problem Relation Age of Onset  . Diabetes Mother   . Hypertension Mother   . Diabetes Father   . Cancer Paternal Grandmother     liver & lung   History  Substance Use Topics  . Smoking status: Former Smoker -- 0.25 packs/day for 1 years  . Smokeless tobacco: Never Used  . Alcohol Use: No   OB History   Grav Para Term Preterm Abortions TAB SAB Ect Mult Living   4 3 3       3      Review of Systems  Gastrointestinal: Positive for abdominal pain.   All other systems reviewed and are negative except as noted in HPI.   Allergies  Review of patient's allergies indicates no known allergies.  Home Medications  No current outpatient prescriptions on file. BP 133/81  Pulse 86  Temp(Src) 98.1 F (36.7 C) (Oral)  Resp 18  SpO2 100%  LMP 08/14/2012 Physical Exam  Nursing note and vitals reviewed. Constitutional: She is oriented to person, place, and  time. She appears well-developed and well-nourished.  HENT:  Head: Normocephalic and atraumatic.  Eyes: EOM are normal. Pupils are equal, round, and reactive to light.  Neck: Normal range of motion. Neck supple.  Cardiovascular: Normal rate, normal heart sounds and intact distal pulses.   Pulmonary/Chest: Effort normal and breath sounds normal.  Abdominal: Bowel sounds are normal. She exhibits no distension. There is no tenderness.  Gravid, consistent with dates  Genitourinary:  Mild white vaginal discharge, no CMT, no adnexal mass or tenderness, no uterine tenderness, cervical os is fingertip  Musculoskeletal: Normal range of motion. She exhibits no edema and no tenderness.  Neurological: She is alert and oriented to person, place, and time. She has normal strength. No cranial nerve deficit or sensory deficit.  Skin: Skin is warm and dry. No rash noted.  Psychiatric: She has a normal mood and affect.    ED Course  Procedures (including critical care time) Labs Reviewed  WET PREP, GENITAL - Abnormal; Notable for the following:    Clue Cells Wet Prep HPF POC FEW (*)    WBC, Wet Prep HPF POC FEW (*)    All other components within normal limits  CBC WITH DIFFERENTIAL - Abnormal; Notable for the following:    HCT 35.1 (*)    All other components within normal limits  COMPREHENSIVE METABOLIC PANEL - Abnormal; Notable for the following:    Potassium 3.4 (*)    Albumin 2.9 (*)  Total Bilirubin 0.2 (*)    All other components within normal limits  URINALYSIS, ROUTINE W REFLEX MICROSCOPIC - Abnormal; Notable for the following:    APPearance CLOUDY (*)    Leukocytes, UA SMALL (*)    All other components within normal limits  HCG, QUANTITATIVE, PREGNANCY - Abnormal; Notable for the following:    hCG, Beta Chain, Quant, S 44010 (*)    All other components within normal limits  URINE MICROSCOPIC-ADD ON - Abnormal; Notable for the following:    Squamous Epithelial / LPF MANY (*)     Bacteria, UA FEW (*)    All other components within normal limits  POCT PREGNANCY, URINE - Abnormal; Notable for the following:    Preg Test, Ur POSITIVE (*)    All other components within normal limits  URINE CULTURE  GC/CHLAMYDIA PROBE AMP  LIPASE, BLOOD   No results found. 1. Abdominal pain in pregnancy     MDM  Labs from triage reviewed. No UTI. No concern for PE or other acute life-threatening etiology of her symptoms. FHT approx 140 by [place], could not find FHT on handheld doppler. Advised to followup with Ob.   Kathalina Ostermann B. Bernette Mayers, MD 12/13/12 0005

## 2012-12-12 NOTE — ED Notes (Signed)
PT. REPORTS GENERALIZED ABDOMINAL PAIN WITH SLIGHT NAUSEA ONSET TODAY , DENIES DIARRHEA , NO FEVER OR CHILLS , DENIES VAGINAL DISCHARGE OR BLEEDING / NO URINARY SYMPTOMS , PT. STATED 4 MONTHS PREGNANT , UNSURE OF LMP OR AOG.

## 2012-12-12 NOTE — ED Notes (Addendum)
2205  Pt to the room and is dressed in gown.  Introduced self to pt and pt is awaiting on MD.  2310  Pelvic cart set up and ready for the MD.

## 2012-12-14 ENCOUNTER — Telehealth (HOSPITAL_COMMUNITY): Payer: Self-pay | Admitting: MS"

## 2012-12-14 ENCOUNTER — Encounter: Payer: Self-pay | Admitting: Obstetrics & Gynecology

## 2012-12-14 ENCOUNTER — Telehealth (HOSPITAL_COMMUNITY): Payer: Self-pay | Admitting: *Deleted

## 2012-12-14 DIAGNOSIS — O285 Abnormal chromosomal and genetic finding on antenatal screening of mother: Secondary | ICD-10-CM | POA: Insufficient documentation

## 2012-12-14 LAB — OB RESULTS CONSOLE GC/CHLAMYDIA
Chlamydia: NEGATIVE
Gonorrhea: NEGATIVE

## 2012-12-14 LAB — URINE CULTURE: Colony Count: >=100000

## 2012-12-14 LAB — GC/CHLAMYDIA PROBE AMP
CT Probe RNA: NEGATIVE
GC Probe RNA: NEGATIVE

## 2012-12-14 NOTE — Telephone Encounter (Signed)
Called Dr. Marcia Brash office to follow up on an abnormal first trimester screen for [place].  Per office, no lab results had ever been received.  Genetic counselor has attempted to get in touch with pt to inform her of results.  Her number is invalid.  Tried to obtain numbers from primary office, but they have the same number.  Followed up with [facility] medical genetics lab, GC from lab had documented that a message was left and results were faxed to [facility] on 6/9.

## 2012-12-14 NOTE — Telephone Encounter (Signed)
Attempted to contact patient regarding first trimester screening results. Message states that number is incorrect or invalid.   Andrea Burns 12/14/2012 9:17 AM

## 2012-12-15 ENCOUNTER — Encounter: Payer: Self-pay | Admitting: Obstetrics & Gynecology

## 2012-12-17 ENCOUNTER — Other Ambulatory Visit: Payer: Self-pay | Admitting: Obstetrics & Gynecology

## 2012-12-17 DIAGNOSIS — O289 Unspecified abnormal findings on antenatal screening of mother: Secondary | ICD-10-CM

## 2012-12-17 DIAGNOSIS — Z0489 Encounter for examination and observation for other specified reasons: Secondary | ICD-10-CM

## 2012-12-17 DIAGNOSIS — IMO0002 Reserved for concepts with insufficient information to code with codable children: Secondary | ICD-10-CM

## 2012-12-23 ENCOUNTER — Ambulatory Visit (HOSPITAL_COMMUNITY)
Admission: RE | Admit: 2012-12-23 | Discharge: 2012-12-23 | Disposition: A | Discharge status: Home / Self Care | Payer: Medicaid Other | Source: Ambulatory Visit | Attending: Obstetrics | Admitting: Obstetrics

## 2012-12-23 ENCOUNTER — Encounter: Payer: Self-pay | Admitting: Obstetrics & Gynecology

## 2012-12-23 ENCOUNTER — Ambulatory Visit (HOSPITAL_COMMUNITY)
Admission: RE | Admit: 2012-12-23 | Discharge: 2012-12-23 | Disposition: A | Discharge status: Home / Self Care | Payer: Medicaid Other | Source: Ambulatory Visit | Attending: Obstetrics & Gynecology | Admitting: Obstetrics & Gynecology

## 2012-12-23 ENCOUNTER — Other Ambulatory Visit: Payer: Self-pay

## 2012-12-23 ENCOUNTER — Ambulatory Visit (INDEPENDENT_AMBULATORY_CARE_PROVIDER_SITE_OTHER): Payer: Medicaid Other | Admitting: Obstetrics & Gynecology

## 2012-12-23 VITALS — BP 117/74 | Temp 98.1°F | Wt 165.0 lb

## 2012-12-23 VITALS — BP 118/63 | HR 86 | Wt 166.0 lb

## 2012-12-23 DIAGNOSIS — Z363 Encounter for antenatal screening for malformations: Secondary | ICD-10-CM | POA: Insufficient documentation

## 2012-12-23 DIAGNOSIS — Z0489 Encounter for examination and observation for other specified reasons: Secondary | ICD-10-CM

## 2012-12-23 DIAGNOSIS — Z3480 Encounter for supervision of other normal pregnancy, unspecified trimester: Secondary | ICD-10-CM

## 2012-12-23 DIAGNOSIS — IMO0002 Reserved for concepts with insufficient information to code with codable children: Secondary | ICD-10-CM

## 2012-12-23 DIAGNOSIS — Z3482 Encounter for supervision of other normal pregnancy, second trimester: Secondary | ICD-10-CM

## 2012-12-23 DIAGNOSIS — N390 Urinary tract infection, site not specified: Secondary | ICD-10-CM

## 2012-12-23 DIAGNOSIS — O289 Unspecified abnormal findings on antenatal screening of mother: Secondary | ICD-10-CM | POA: Insufficient documentation

## 2012-12-23 DIAGNOSIS — O358XX0 Maternal care for other (suspected) fetal abnormality and damage, not applicable or unspecified: Secondary | ICD-10-CM | POA: Insufficient documentation

## 2012-12-23 DIAGNOSIS — Z1389 Encounter for screening for other disorder: Secondary | ICD-10-CM | POA: Insufficient documentation

## 2012-12-23 DIAGNOSIS — O352XX0 Maternal care for (suspected) hereditary disease in fetus, not applicable or unspecified: Secondary | ICD-10-CM | POA: Insufficient documentation

## 2012-12-23 LAB — POCT URINALYSIS DIPSTICK
Bilirubin, UA: NEGATIVE
Blood, UA: NEGATIVE
Glucose, UA: NEGATIVE
Ketones, UA: NEGATIVE
Nitrite, UA: POSITIVE
Spec Grav, UA: 1.025
Urobilinogen, UA: NEGATIVE
pH, UA: 6

## 2012-12-23 MED ORDER — NITROFURANTOIN MONOHYD MACRO 100 MG PO CAPS: 100.0000 mg | ORAL_CAPSULE | Freq: Two times a day (BID) | ORAL | Status: DC

## 2012-12-23 NOTE — Progress Notes (Signed)
Genetic Counseling  Visit Summary Note  Appointment Date: 12/23/2012 Referred By: Harper,Charles A  Date of Birth: April 30, 1986  Pregnancy history: [id number] Estimated Date of Delivery: 05/21/13 Estimated Gestational Age: [redacted]w[redacted]d  Ms. Andrea Burns was seen for genetic counseling because of an increased risk for Down syndrome based on First trimester screening through [facility].    Ms. Oshel was counseled regarding the First trimester screening result and the associated 1 in 28 risk for fetal Down syndrome. In addition, we reviewed the screen adjusted reduction in risks for trisomy 13/18.  We discussed that the First trimester screen combines both biochemical analysis (levels of PAPP-A and free beta hCG) and ultrasound (fetal nuchal translucency and nasal bone) to adjust the maternal age related risk of aneuploidy, providing a pregnancy specific risk assessment.  We reviewed chromosomes, nondisjunction, and the common features and prognoses of specific fetal aneuploidy, including Down syndrome (trisomy 77), trisomies 52 and 47, and Turner syndrome.    We then discussed that the fetal nuchal translucency measurement (2.9 mm at [redacted]w[redacted]d) was at the 95th percentile for the gestational age. We reviewed that the fetal NT refers to a fluid filled space between the skin and soft tissues behind the cervical spine. This space is traditionally measurable between 11 and 13.[redacted] weeks gestation and is considered enlarged when greater than the 95th percentile for gestational age. Ms. Minich was counseled regarding the various common etiologies for an enlarged NT including: aneuploidy, single gene conditions, cardiac or great vessel abnormalities, lymphatic system failure, decreased fetal movement, and fetal anemia.   Regarding fetal aneuploidy, we reviewed other available screening options including noninvasive prenatal screening (NIPS)/cell free fetal DNA (cffDNA) testing and detailed  ultrasound. We reviewed the benefits and limitations of each option. Specifically, we discussed the conditions for which each test screens, the detection rates, and false positive rates of each.  Ms. Cupples was then briefly counseled regarding diagnostic testing via amniocentesis.  We reviewed the associated benefits, limitations, and risks for complications, including spontaneous pregnancy loss.  After thoughtful consideration of her options, Ms. Geffert elected to proceed with NIPS/cffDNA testing.  Results are typically available in ~7-10 business days.  Ms. Caroll also elected to have a detailed anatomy ultrasound today.  There were no visualized anomalies or markers for fetal aneuploidy.  The report will be documented separately.  She declined amniocentesis at this time.    Regarding other etiologies for an increased NT, we discussed the elevated risk for a fetal cardiac defect. We reviewed the option of a fetal echocardiogram. The patient expressed interest in having a fetal echocardiogram.  Fetal echocardiogram was scheduled for ~[redacted] weeks gestation.  We then discussed that a small percentage of increased nuchal translucency values are related to an underlying single gene condition. She was counseled that single gene conditions are not routinely tested for prenatally unless the ultrasound findings or family history significantly increase the suspicion of a specific single gene disorder. We briefly reviewed common inheritance patterns (dominant, recessive, and X-linked) as well as the associated risks of recurrence.    Ms. Friesen was provided with written information regarding sickle cell anemia (SCA) including the carrier frequency and incidence in the Hispanic/African-American population, the availability of carrier testing and prenatal diagnosis if indicated.  In addition, we discussed that hemoglobinopathies are routinely screened for as part of the [place] newborn screening panel.  She declined hemoglobin  electrophoresis today.  Both family histories were reviewed and found to be contributory for Ms.  Dawes's paternal first cousin having a diagnosis of Down syndrome.  This relative also likely had surgical correction of an associated duodenal atresia.  The mother of this relative was said to be advanced maternal age at the time of delivery. We discussed that 95% of cases of Down syndrome are not inherited and are the result of non-disjunction.  Three to 4% of cases of Down syndrome are the result of a translocation involving chromosome 21.  We discussed the option of chromosome analysis to determine if an individual is a carrier of a balanced translocation involving chromosome 21.  If an individual carries a balanced translocation involving chromosome 21, then the chance to have a baby with Down syndrome would be greater than the maternal age-related risk.     Considering Ms. Nanez's aunt's maternal age at delivery, we discussed that it is most likely that her cousin has trisomy 48.  In this scenario, the risk of recurrence would not be expected to be increased.  The remainder of the reported family history was noncontributory for  birth defects, mental retardation, and known genetic conditions. Without further information regarding the provided family history, an accurate genetic risk cannot be calculated. Further genetic counseling is warranted if more information is obtained.  Ms.  Christenbury denied exposure to environmental toxins or chemical agents. She denied the use of alcohol, tobacco or street drugs. She denied significant viral illnesses during the course of her pregnancy. Her medical and surgical histories were contributory for depression and anxiety.  Ms. Treloar reported that she is not currently taking medication to treat her mental illness.  She was encouraged to discuss this further with her primary obstetrician and counseled regarding the increased risk for postpartum depression, given her  history of mental illness.   I counseled Ms. Antonio regarding the above risks and available options.  The approximate face-to-face time with the genetic counselor was 40 minutes.  Donald Prose, MS  Certified Genetic Counselor

## 2012-12-23 NOTE — Progress Notes (Signed)
Andreya A Jaskulski  was seen today for an ultrasound appointment.  See full report in [facility].  Comments: Ms. Dingwall was seen today due to increased risk for Down syndrome by first trimester screen (1:28 risk).  Additionally, she was noted to have a thickened NT during her initial evaluaiton (96th %tile for gestational age).  See separate note from [employer], broadcasting/film/video.  The patient elected to undergo NIPS (cell free fetal DNA).  Impression: Single IUP at 19 1/7 weeks Ultrasound measurements consistent with dates. Normal detailed fetal anatomy No markers associated with aneuploidy were noted Normal amniotic fluid volume  Recommendations: Recommend follow-up ultrasound examination in 6 weeks.  Fetal echo scheduled due to thickened NT.  Alpha Gula, MD

## 2012-12-23 NOTE — Progress Notes (Signed)
P 80 Patient reports she is having urinary symptoms- frequency and pressure

## 2012-12-25 LAB — CULTURE, OB URINE: Colony Count: >=100000

## 2013-01-04 ENCOUNTER — Telehealth (HOSPITAL_COMMUNITY): Payer: Self-pay

## 2013-01-04 ENCOUNTER — Encounter: Payer: Self-pay | Admitting: Obstetrics & Gynecology

## 2013-01-04 NOTE — Telephone Encounter (Signed)
Called Rilynn A [facility] to discuss her cell free fetal DNA test results.  Ms. SAMARIYAH COWLES had Panorama testing through [facility].  Testing was offered because of an increased risk for DS based on First trimester screening through [facility].   The patient was identified by name and DOB.  We reviewed that these are within normal limits, showing a less than 1 in 10,000 risk for trisomies 21, 18 and 13, and monosomy X (Turner syndrome).  In addition, the risk for triploidy/vanishing twin and sex chromosome trisomies (47,XXX and 47,XXY) was also low risk.  We reviewed that this testing identifies > 99% of pregnancies with trisomy 30, trisomy 6, trisomy 44, sex chromosome trisomies (47,XXX and 47,XXY), and triploidy.  The detection rate for monosomy X is ~92%.  The false positive rate is <0.1% for all conditions. Testing was also consistent with female gender.  She understands that this testing does not identify all genetic conditions.  All questions were answered to her satisfaction, she was encouraged to call with additional questions or concerns.  Donald Prose, MS Certified Genetic Counselor

## 2013-01-14 ENCOUNTER — Encounter: Payer: Self-pay | Admitting: Obstetrics

## 2013-01-20 ENCOUNTER — Ambulatory Visit (INDEPENDENT_AMBULATORY_CARE_PROVIDER_SITE_OTHER): Payer: Medicaid Other | Admitting: Obstetrics & Gynecology

## 2013-01-20 VITALS — BP 119/79 | Temp 98.2°F | Wt 174.0 lb

## 2013-01-20 DIAGNOSIS — N390 Urinary tract infection, site not specified: Secondary | ICD-10-CM

## 2013-01-20 DIAGNOSIS — O285 Abnormal chromosomal and genetic finding on antenatal screening of mother: Secondary | ICD-10-CM

## 2013-01-20 DIAGNOSIS — J069 Acute upper respiratory infection, unspecified: Secondary | ICD-10-CM

## 2013-01-20 DIAGNOSIS — Z3480 Encounter for supervision of other normal pregnancy, unspecified trimester: Secondary | ICD-10-CM

## 2013-01-20 DIAGNOSIS — Z3482 Encounter for supervision of other normal pregnancy, second trimester: Secondary | ICD-10-CM

## 2013-01-20 DIAGNOSIS — O289 Unspecified abnormal findings on antenatal screening of mother: Secondary | ICD-10-CM

## 2013-01-20 LAB — POCT URINALYSIS DIPSTICK
Bilirubin, UA: NEGATIVE
Blood, UA: NEGATIVE
Glucose, UA: NEGATIVE
Ketones, UA: NEGATIVE
Nitrite, UA: NEGATIVE
Protein, UA: NEGATIVE
Spec Grav, UA: 1.015
Urobilinogen, UA: NEGATIVE
pH, UA: 8

## 2013-01-20 MED ORDER — AMOXICILLIN 500 MG PO CAPS: 500.0000 mg | ORAL_CAPSULE | Freq: Two times a day (BID) | ORAL | Status: DC

## 2013-01-20 NOTE — Progress Notes (Signed)
P- 95 Pt states she is having lower back pain and pain in both of her sides. Pt states she is also having pain in her ears and throat.

## 2013-01-21 ENCOUNTER — Encounter: Payer: Self-pay | Admitting: Obstetrics & Gynecology

## 2013-01-21 LAB — CULTURE, OB URINE
Colony Count: NO GROWTH
Organism ID, Bacteria: NO GROWTH

## 2013-01-21 NOTE — Patient Instructions (Addendum)
Otitis Media, Adult A middle ear infection is an infection in the space behind the eardrum. The medical name for this is "otitis media." It may happen after a common cold. It is caused by a germ that starts growing in that space. You may feel swollen glands in your neck on the side of the ear infection. HOME CARE INSTRUCTIONS   Take your medicine as directed until it is gone, even if you feel better after the first few days.  Only take over-the-counter or prescription medicines for pain, discomfort, or fever as directed by your caregiver.  Occasional use of a nasal decongestant a couple times per day may help with discomfort and help the eustachian tube to drain better. Follow up with your caregiver in 10 to 14 days or as directed, to be certain that the infection has cleared. Not keeping the appointment could result in a chronic or permanent injury, pain, hearing loss and disability. If there is any problem keeping the appointment, you must call back to this facility for assistance. SEEK IMMEDIATE MEDICAL CARE IF:   You are not getting better in 2 to 3 days.  You have pain that is not controlled with medication.  You feel worse instead of better.  You cannot use the medication as directed.  You develop swelling, redness or pain around the ear or stiffness in your neck. MAKE SURE YOU:   Understand these instructions.  Will watch your condition.  Will get help right away if you are not doing well or get worse. Document Released: 03/01/2004 Document Revised: 08/19/2011 Document Reviewed: 01/01/2008 [facility] Patient Information 2014 [place], [place]. Glucose Tolerance Test This is a test to see how your body processes carbohydrates. This test is often done to check patients for diabetes or the possibility of developing it. PREPARATION FOR TEST You should have nothing to eat or drink 12 hours before the test. You will be given a form of sugar (glucose) and then blood samples will be drawn  from your vein to determine the level of sugar in your blood. Alternatively, blood may be drawn from your finger for testing. You should not smoke or exercise during the test. NORMAL FINDINGS  Fasting: 70-115 mg/dL  30 minutes: less than 200 mg/dL  1 hour: less than 161 mg/dL  2 hours: less than 096 mg/dL  3 hours: 04-540 mg/dL  4 hours: 98-119 mg/dL Ranges for normal findings may vary among different laboratories and hospitals. You should always check with your doctor after having lab work or other tests done to discuss the meaning of your test results and whether your values are considered within normal limits. MEANING OF TEST Your caregiver will go over the test results with you and discuss the importance and meaning of your results, as well as treatment options and the need for additional tests. OBTAINING THE TEST RESULTS It is your responsibility to obtain your test results. Ask the lab or department performing the test when and how you will get your results. Document Released: 06/19/2004 Document Revised: 08/19/2011 Document Reviewed: 05/07/2008 [facility] Patient Information 2014 [place], [place].

## 2013-01-21 NOTE — Progress Notes (Signed)
C/O ear pain/sore throat.

## 2013-01-25 ENCOUNTER — Inpatient Hospital Stay (HOSPITAL_COMMUNITY): Payer: Medicaid Other

## 2013-01-25 ENCOUNTER — Inpatient Hospital Stay (HOSPITAL_COMMUNITY)
Admission: AD | Admit: 2013-01-25 | Discharge: 2013-01-25 | Disposition: A | Discharge status: Home / Self Care | Payer: Medicaid Other | Source: Ambulatory Visit | Attending: Obstetrics & Gynecology | Admitting: Obstetrics & Gynecology

## 2013-01-25 ENCOUNTER — Encounter (HOSPITAL_COMMUNITY): Payer: Self-pay | Admitting: *Deleted

## 2013-01-25 DIAGNOSIS — Z22330 Carrier of Group B streptococcus: Secondary | ICD-10-CM

## 2013-01-25 DIAGNOSIS — O285 Abnormal chromosomal and genetic finding on antenatal screening of mother: Secondary | ICD-10-CM

## 2013-01-25 DIAGNOSIS — H9209 Otalgia, unspecified ear: Secondary | ICD-10-CM

## 2013-01-25 DIAGNOSIS — O99891 Other specified diseases and conditions complicating pregnancy: Secondary | ICD-10-CM | POA: Insufficient documentation

## 2013-01-25 DIAGNOSIS — J069 Acute upper respiratory infection, unspecified: Secondary | ICD-10-CM | POA: Insufficient documentation

## 2013-01-25 DIAGNOSIS — R05 Cough: Secondary | ICD-10-CM

## 2013-01-25 DIAGNOSIS — R87612 Low grade squamous intraepithelial lesion on cytologic smear of cervix (LGSIL): Secondary | ICD-10-CM

## 2013-01-25 DIAGNOSIS — R059 Cough, unspecified: Secondary | ICD-10-CM

## 2013-01-25 MED ORDER — GUAIFENESIN 100 MG/5ML PO LIQD: 100.0000 mg | ORAL | Status: DC | PRN

## 2013-01-25 NOTE — MAU Note (Signed)
PT SAYS SHE STARTED FEELING BAD ON 8-12-  HAD SORETHROAT  ,RIGHT EARACHE ,  COULD NOT TALK- LOST VOICE.   ON 8-13-   WENT TO SEE DR Tamela Oddi-   GAVE HER AMOXICILLIN .  Marland Kitchen  BECAME WORSE ON 8-14- Thursday- HAD FEVER- 102.  WHEN COUGHING  HEAD WOULD HURT. CALLED OFFICE THIS AM-  WHEN CALLED [facility]- TOLD HER TO COME TO [facility]U.    IN [facility]U- PT HAS AUDIBLE CONGESTED ,  PRODUCTIVE  COUGH.

## 2013-01-25 NOTE — MAU Provider Note (Signed)
History     CSN: [id number]  Arrival date and time: 01/25/13 1901   None     No chief complaint on file.  HPI ADDALYN SPEEDY is a 27 y.o. [phone] at [redacted]w[redacted]d presents for evaluation of one week of upper respiratory infection. Patient reports that one week ago she was evaluated by her OB and started amoxicillin for a cough, sore throat, right ear pain. And headache when coughing. Patient has completed her antibiotic course and reports that her cough has been getting worse. Patient also reports a fever Thursday Friday and feeling chills over the weekend. Patient states that she's having difficulty sleeping his secretions. She's having significant productive cough. Patient denies decreased by mouth intake, change in urinary habits or bowel movements.  Positive fetal movement, no loss of fluid, no vaginal bleeding, no contractions   OB History   Grav Para Term Preterm Abortions TAB SAB Ect Mult Living   4 3 3       3       Past Medical History  Diagnosis Date  . Medical history non-contributory     Past Surgical History  Procedure Laterality Date  . No past surgeries    . Iud removal      Family History  Problem Relation Age of Onset  . Diabetes Mother   . Hypertension Mother   . Diabetes Father   . Cancer Paternal Grandmother     liver & lung    History  Substance Use Topics  . Smoking status: Former Smoker -- 0.25 packs/day for 1 years  . Smokeless tobacco: Never Used  . Alcohol Use: No    Allergies: No Known Allergies  Prescriptions prior to admission  Medication Sig Dispense Refill  . amoxicillin (AMOXIL) 500 MG capsule Take 1 capsule (500 mg total) by mouth 2 (two) times daily.  14 capsule  0  . nitrofurantoin, macrocrystal-monohydrate, (MACROBID) 100 MG capsule Take 1 capsule (100 mg total) by mouth 2 (two) times daily.  14 capsule  0    Review of Systems  Constitutional: Positive for fever, chills and malaise/fatigue. Negative for weight loss and  diaphoresis.  HENT: Positive for ear pain (right ear pain now resolved). Negative for ear discharge.   Eyes: Negative for pain.  Respiratory: Positive for cough and sputum production. Negative for hemoptysis, shortness of breath and wheezing.   Cardiovascular: Negative for chest pain.  Gastrointestinal: Negative for heartburn, nausea, vomiting, abdominal pain, diarrhea and constipation.  Genitourinary: Negative for dysuria, urgency, frequency and hematuria.  Skin: Negative for rash.  Neurological: Positive for headaches. Negative for weakness.   Physical Exam   Blood pressure 120/75, pulse 99, temperature 97.6 F (36.4 C), temperature source Oral, resp. rate 20, height 5\' 2"  (1.575 m), weight 79.379 kg (175 lb), last menstrual period 08/14/2012.  Physical Exam  Nursing note and vitals reviewed. Constitutional: She is oriented to person, place, and time. She appears well-developed and well-nourished. No distress.  HENT:  Head: Normocephalic and atraumatic.  Right Ear: External ear normal.  Left Ear: External ear normal.  Nose: Nose normal.  Mouth/Throat: No oropharyngeal exudate.  Eyes: Conjunctivae and EOM are normal. Left eye exhibits no discharge.  Neck: Normal range of motion. Neck supple.  Cardiovascular: Normal rate, regular rhythm, normal heart sounds and intact distal pulses.  Exam reveals no gallop and no friction rub.   No murmur heard. Respiratory: Effort normal and breath sounds normal. No respiratory distress. She has no wheezes. She has no rales. She  exhibits no tenderness.  Patient has deep productive cough while in room   GI: Soft. Bowel sounds are normal. She exhibits no distension and no mass. There is no tenderness. There is no rebound and no guarding.  Lymphadenopathy:    She has no cervical adenopathy.  Neurological: She is alert and oriented to person, place, and time.  Skin: She is not diaphoretic.  Psychiatric: She has a normal mood and affect. Her behavior  is normal. Judgment and thought content normal.   *RADIOLOGY REPORT*  Clinical Data: [redacted] weeks pregnant, coughing  CHEST - 2 VIEW  Comparison: None.  Findings: Normal mediastinum and heart silhouette. No evidence  effusion, infiltrate, or pneumothorax. Mild vascular congestion.  IMPRESSION:  Mild vascular congestion. No evidence of pneumonia.  MAU Course  Procedures  MDM Patient likely viral versus bacterial upper respiratory infection. More likely viral given no response to amoxicillin. There may be some improvement given improving ear pain. However patient is concerned worsening cough, will evaluate with chest x-ray to rule out pneumonia.   Assessment and Plan  Akeylah Hendel Bulls is a 27 y.o. G4P3003 at [redacted]w[redacted]d your for evaluation of respiratory infection. Chest x-ray shows no evidence of PNA. Recommend pt use mucinex for congestion, may also use steam and continue to cough up phlem. Cough may last up to 3 weeks. As long as pt does not have fevers and continues to improve. Recommend pt continue to follow up with Primary OB.  Tawana Scale 01/25/2013, 8:04 PM

## 2013-01-26 ENCOUNTER — Encounter: Payer: Self-pay | Admitting: Obstetrics

## 2013-02-03 ENCOUNTER — Ambulatory Visit (HOSPITAL_COMMUNITY)
Admission: RE | Admit: 2013-02-03 | Discharge: 2013-02-03 | Disposition: A | Discharge status: Home / Self Care | Payer: Medicaid Other | Source: Ambulatory Visit | Attending: Obstetrics & Gynecology | Admitting: Obstetrics & Gynecology

## 2013-02-03 DIAGNOSIS — O358XX0 Maternal care for other (suspected) fetal abnormality and damage, not applicable or unspecified: Secondary | ICD-10-CM | POA: Insufficient documentation

## 2013-02-03 DIAGNOSIS — O352XX0 Maternal care for (suspected) hereditary disease in fetus, not applicable or unspecified: Secondary | ICD-10-CM | POA: Insufficient documentation

## 2013-02-03 DIAGNOSIS — O289 Unspecified abnormal findings on antenatal screening of mother: Secondary | ICD-10-CM | POA: Insufficient documentation

## 2013-02-03 NOTE — Progress Notes (Signed)
[facility] ultrasound  Indication: 27 yr old G7P3003 at [redacted]w[redacted]d with elevated risk of trisomy 50 of 1:28 on first trimester screen and nuchal translucency at the 95th% for follow up fetal growth.  Findings: 1. Single intrauterine pregnancy. 2. Estimated fetal weight is in the 65th%. 3. Posterior placenta without evidence of previa. 4. Normal amniotic fluid volume. 5. Normal transabdominal cervical length. 6. The limited anatomy survey is limited.  Recommendations: 1. Appropriate fetal growth. 2. Abnormal first trimester screen/ thickened nuchal translucency: - previously counseled - had normal cell free fetal DNA screen - had normal fetal echocardiogram 3. Recommend follow up ultrasounds as clinically indicated.  Eulis Foster, MD

## 2013-02-17 ENCOUNTER — Ambulatory Visit (INDEPENDENT_AMBULATORY_CARE_PROVIDER_SITE_OTHER): Payer: Medicaid Other | Admitting: Obstetrics & Gynecology

## 2013-02-17 ENCOUNTER — Other Ambulatory Visit: Payer: Medicaid Other

## 2013-02-17 VITALS — BP 110/76 | Temp 97.6°F | Wt 175.0 lb

## 2013-02-17 DIAGNOSIS — Z3482 Encounter for supervision of other normal pregnancy, second trimester: Secondary | ICD-10-CM

## 2013-02-17 DIAGNOSIS — Z23 Encounter for immunization: Secondary | ICD-10-CM

## 2013-02-17 DIAGNOSIS — Z3480 Encounter for supervision of other normal pregnancy, unspecified trimester: Secondary | ICD-10-CM

## 2013-02-17 LAB — CBC
HCT: 36.3 % (ref 36.0–46.0)
Hemoglobin: 12.3 g/dL (ref 12.0–15.0)
MCH: 29.7 pg (ref 26.0–34.0)
MCHC: 33.9 g/dL (ref 30.0–36.0)
MCV: 87.7 fL (ref 78.0–100.0)
Platelets: 230 10*3/uL (ref 150–400)
RBC: 4.14 MIL/uL (ref 3.87–5.11)
RDW: 13.1 % (ref 11.5–15.5)
WBC: 9.5 10*3/uL (ref 4.0–10.5)

## 2013-02-17 LAB — POCT URINALYSIS DIPSTICK
Bilirubin, UA: NEGATIVE
Blood, UA: NEGATIVE
Glucose, UA: NEGATIVE
Ketones, UA: NEGATIVE
Nitrite, UA: NEGATIVE
Protein, UA: NEGATIVE
Spec Grav, UA: 1.015
Urobilinogen, UA: NEGATIVE
pH, UA: 7

## 2013-02-17 MED ORDER — SULFAMETHOXAZOLE-TMP DS 800-160 MG PO TABS: 1.0000 | ORAL_TABLET | Freq: Two times a day (BID) | ORAL | Status: DC

## 2013-02-17 NOTE — Progress Notes (Signed)
P 86 Patient reprorts she has persistant UTI- she feels she has one now and is experiencing incontinence.

## 2013-02-18 ENCOUNTER — Encounter: Payer: Self-pay | Admitting: Obstetrics & Gynecology

## 2013-02-18 LAB — GLUCOSE TOLERANCE, 2 HOURS W/ 1HR
Glucose, 1 hour: 109 mg/dL (ref 70–170)
Glucose, 2 hour: 80 mg/dL (ref 70–139)
Glucose, Fasting: 63 mg/dL — ABNORMAL LOW (ref 70–99)

## 2013-02-18 LAB — HIV ANTIBODY (ROUTINE TESTING W REFLEX): HIV: NONREACTIVE

## 2013-02-18 LAB — RPR

## 2013-02-18 NOTE — Progress Notes (Signed)
UTI

## 2013-02-18 NOTE — Patient Instructions (Signed)
Urinary Tract Infection Urinary tract infections (UTIs) can develop anywhere along your urinary tract. Your urinary tract is your body's drainage system for removing wastes and extra water. Your urinary tract includes two kidneys, two ureters, a bladder, and a urethra. Your kidneys are a pair of bean-shaped organs. Each kidney is about the size of your fist. They are located below your ribs, one on each side of your spine. CAUSES Infections are caused by microbes, which are microscopic organisms, including fungi, viruses, and bacteria. These organisms are so small that they can only be seen through a microscope. Bacteria are the microbes that most commonly cause UTIs. SYMPTOMS  Symptoms of UTIs may vary by age and gender of the patient and by the location of the infection. Symptoms in young women typically include a frequent and intense urge to urinate and a painful, burning feeling in the bladder or urethra during urination. Older women and men are more likely to be tired, shaky, and weak and have muscle aches and abdominal pain. A fever may mean the infection is in your kidneys. Other symptoms of a kidney infection include pain in your back or sides below the ribs, nausea, and vomiting. DIAGNOSIS To diagnose a UTI, your caregiver will ask you about your symptoms. Your caregiver also will ask to provide a urine sample. The urine sample will be tested for bacteria and white blood cells. White blood cells are made by your body to help fight infection. TREATMENT  Typically, UTIs can be treated with medication. Because most UTIs are caused by a bacterial infection, they usually can be treated with the use of antibiotics. The choice of antibiotic and length of treatment depend on your symptoms and the type of bacteria causing your infection. HOME CARE INSTRUCTIONS  If you were prescribed antibiotics, take them exactly as your caregiver instructs you. Finish the medication even if you feel better after you  have only taken some of the medication.  Drink enough water and fluids to keep your urine clear or pale yellow.  Avoid caffeine, tea, and carbonated beverages. They tend to irritate your bladder.  Empty your bladder often. Avoid holding urine for long periods of time.  Empty your bladder before and after sexual intercourse.  After a bowel movement, women should cleanse from front to back. Use each tissue only once. SEEK MEDICAL CARE IF:   You have back pain.  You develop a fever.  Your symptoms do not begin to resolve within 3 days. SEEK IMMEDIATE MEDICAL CARE IF:   You have severe back pain or lower abdominal pain.  You develop chills.  You have nausea or vomiting.  You have continued burning or discomfort with urination. MAKE SURE YOU:   Understand these instructions.  Will watch your condition.  Will get help right away if you are not doing well or get worse. Document Released: 03/06/2005 Document Revised: 11/26/2011 Document Reviewed: 07/05/2011 [facility] Patient Information 2014 [place], [place]. Tetanus, Diphtheria, Pertussis (Tdap) Vaccine What You Need to Know WHY GET VACCINATED? Tetanus, diphtheria and pertussis can be very serious diseases, even for adolescents and adults. Tdap vaccine can protect [place] from these diseases. TETANUS (Lockjaw) causes painful muscle tightening and stiffness, usually all over the body.  It can lead to tightening of muscles in the head and neck so you can't open your mouth, swallow, or sometimes even breathe. Tetanus kills about 1 out of 5 people who are infected. DIPHTHERIA can cause a thick coating to form in the back of the  throat.  It can lead to breathing problems, paralysis, heart failure, and death. PERTUSSIS (Whooping Cough) causes severe coughing spells, which can cause difficulty breathing, vomiting and disturbed sleep.  It can also lead to weight loss, incontinence, and rib fractures. Up to 2 in 100 adolescents and 5 in 100  adults with pertussis are hospitalized or have complications, which could include pneumonia and death. These diseases are caused by bacteria. Diphtheria and pertussis are spread from person to person through coughing or sneezing. Tetanus enters the body through cuts, scratches, or wounds. Before vaccines, the [place] saw as many as 200,000 cases a year of diphtheria and pertussis, and hundreds of cases of tetanus. Since vaccination began, tetanus and diphtheria have dropped by about 99% and pertussis by about 80%. TDAP VACCINE Tdap vaccine can protect adolescents and adults from tetanus, diphtheria, and pertussis. One dose of Tdap is routinely given at age 72 or 42. People who did not get Tdap at that age should get it as soon as possible. Tdap is especially important for health care professionals and anyone having close contact with a baby younger than 12 months. Pregnant women should get a dose of Tdap during every pregnancy, to protect the newborn from pertussis. Infants are most at risk for severe, life-threatening complications from pertussis. A similar vaccine, called Td, protects from tetanus and diphtheria, but not pertussis. A Td booster should be given every 10 years. Tdap may be given as one of these boosters if you have not already gotten a dose. Tdap may also be given after a severe cut or burn to prevent tetanus infection. Your doctor can give you more information. Tdap may safely be given at the same time as other vaccines. SOME PEOPLE SHOULD NOT GET THIS VACCINE  If you ever had a life-threatening allergic reaction after a dose of any tetanus, diphtheria, or pertussis containing vaccine, OR if you have a severe allergy to any part of this vaccine, you should not get Tdap. Tell your doctor if you have any severe allergies.  If you had a coma, or long or multiple seizures within 7 days after a childhood dose of DTP or DTaP, you should not get Tdap, unless a cause other than the  vaccine was found. You can still get Td.  Talk to your doctor if you:  have epilepsy or another nervous system problem,  had severe pain or swelling after any vaccine containing diphtheria, tetanus or pertussis,  ever had Guillain-Barr Syndrome (GBS),  aren't feeling well on the day the shot is scheduled. RISKS OF A VACCINE REACTION With any medicine, including vaccines, there is a chance of side effects. These are usually mild and go away on their own, but serious reactions are also possible. Brief fainting spells can follow a vaccination, leading to injuries from falling. Sitting or lying down for about 15 minutes can help prevent these. Tell your doctor if you feel dizzy or light-headed, or have vision changes or ringing in the ears. Mild problems following Tdap (Did not interfere with activities)  Pain where the shot was given (about 3 in 4 adolescents or 2 in 3 adults)  Redness or swelling where the shot was given (about 1 person in 5)  Mild fever of at least 100.53F (up to about 1 in 25 adolescents or 1 in 100 adults)  Headache (about 3 or 4 people in 10)  Tiredness (about 1 person in 3 or 4)  Nausea, vomiting, diarrhea, stomach ache (up to 1 in  4 adolescents or 1 in 10 adults)  Chills, body aches, sore joints, rash, swollen glands (uncommon) Moderate problems following Tdap (Interfered with activities, but did not require medical attention)  Pain where the shot was given (about 1 in 5 adolescents or 1 in 100 adults)  Redness or swelling where the shot was given (up to about 1 in 16 adolescents or 1 in 25 adults)  Fever over 102F (about 1 in 100 adolescents or 1 in 250 adults)  Headache (about 3 in 20 adolescents or 1 in 10 adults)  Nausea, vomiting, diarrhea, stomach ache (up to 1 or 3 people in 100)  Swelling of the entire arm where the shot was given (up to about 3 in 100). Severe problems following Tdap (Unable to perform usual activities, required medical  attention)  Swelling, severe pain, bleeding and redness in the arm where the shot was given (rare). A severe allergic reaction could occur after any vaccine (estimated less than 1 in a million doses). WHAT IF THERE IS A SERIOUS REACTION? What should I look for?  Look for anything that concerns you, such as signs of a severe allergic reaction, very high fever, or behavior changes. Signs of a severe allergic reaction can include hives, swelling of the face and throat, difficulty breathing, a fast heartbeat, dizziness, and weakness. These would start a few minutes to a few hours after the vaccination. What should I do?  If you think it is a severe allergic reaction or other emergency that can't wait, call 9-1-1 or get the person to the nearest hospital. Otherwise, call your doctor.  Afterward, the reaction should be reported to the "Vaccine Adverse Event Reporting System" (VAERS). Your doctor might file this report, or you can do it yourself through the VAERS web site at www.vaers.LAgents.no, or by calling 1-9061216004. VAERS is only for reporting reactions. They do not give medical advice.  THE NATIONAL VACCINE INJURY COMPENSATION PROGRAM The National Vaccine Injury Compensation Program (VICP) is a federal program that was created to compensate people who may have been injured by certain vaccines. Persons who believe they may have been injured by a vaccine can learn about the program and about filing a claim by calling 1-657 082 3997 or visiting the VICP website at SpiritualWord.at. HOW CAN I LEARN MORE?  Ask your doctor.  Call your local or state health department.  Contact the Centers for Disease Control and Prevention (CDC):  Call (872)046-3075 or visit CDC's website at PicCapture.uy. CDC Tdap Vaccine VIS (10/17/11) Document Released: 11/26/2011 Document Revised: 02/19/2012 Document Reviewed: 11/26/2011 [employer] Patient Information 2014 [place], [place]. Influenza  Vaccine (Flu Vaccine, Inactivated) 2013 2014 What You Need to Know WHY GET VACCINATED?  Influenza ("flu") is a contagious disease that spreads around the [place] every winter, usually between October and May.  Flu is caused by the influenza virus, and can be spread by coughing, sneezing, and close contact.  Anyone can get flu, but the risk of getting flu is highest among children. Symptoms come on suddenly and may last several days. They can include:  Fever or chills.  Sore throat.  Muscle aches.  Fatigue.  Cough.  Headache.  Runny or stuffy nose. Flu can make some people much sicker than others. These people include young children, people 73 and older, pregnant women, and people with certain health conditions such as heart, lung or kidney disease, or a weakened immune system. Flu vaccine is especially important for these people, and anyone in close contact with them. Flu can  also lead to pneumonia, and make existing medical conditions worse. It can cause diarrhea and seizures in children. Each year thousands of people in the [place] die from flu, and many more are hospitalized. Flu vaccine is the best protection we have from flu and its complications. Flu vaccine also helps prevent spreading flu from person to person. INACTIVATED FLU VACCINE There are 2 types of influenza vaccine:  You are getting an inactivated flu vaccine, which does not contain any live influenza virus. It is given by injection with a needle, and often called the "flu shot."  A different live, attenuated (weakened) influenza vaccine is sprayed into the nostrils. This vaccine is described in a separate Vaccine Information Statement. Flu vaccine is recommended every year. Children 6 months through 68 years of age should get 2 doses the first year they get vaccinated. Flu viruses are always changing. Each year's flu vaccine is made to protect from viruses that are most likely to cause disease that year.  While flu vaccine cannot prevent all cases of flu, it is our best defense against the disease. Inactivated flu vaccine protects against 3 or 4 different influenza viruses. It takes about 2 weeks for protection to develop after the vaccination, and protection lasts several months to a year. Some illnesses that are not caused by influenza virus are often mistaken for flu. Flu vaccine will not prevent these illnesses. It can only prevent influenza. A "high-dose" flu vaccine is available for people 65 years of age and older. The person giving you the vaccine can tell you more about it. Some inactivated flu vaccine contains a very small amount of a mercury-based preservative called thimerosal. Studies have shown that thimerosal in vaccines is not harmful, but flu vaccines that do not contain a preservative are available. SOME PEOPLE SHOULD NOT GET THIS VACCINE Tell the person who gives you the vaccine:  If you have any severe (life-threatening) allergies. If you ever had a life-threatening allergic reaction after a dose of flu vaccine, or have a severe allergy to any part of this vaccine, you may be advised not to get a dose. Most, but not all, types of flu vaccine contain a small amount of egg.  If you ever had Guillain Barr Syndrome (a severe paralyzing illness, also called GBS). Some people with a history of GBS should not get this vaccine. This should be discussed with your doctor.  If you are not feeling well. They might suggest waiting until you feel better. But you should come back. RISKS OF A VACCINE REACTION With a vaccine, like any medicine, there is a chance of side effects. These are usually mild and go away on their own. Serious side effects are also possible, but are very rare. Inactivated flu vaccine does not contain live flu virus, sogetting flu from this vaccine is not possible. Brief fainting spells and related symptoms (such as jerking movements) can happen after any medical procedure,  including vaccination. Sitting or lying down for about 15 minutes after a vaccination can help prevent fainting and injuries caused by falls. Tell your doctor if you feel dizzy or lightheaded, or have vision changes or ringing in the ears. Mild problems following inactivated flu vaccine:  Soreness, redness, or swelling where the shot was given.  Hoarseness; sore, red or itchy eyes; or cough.  Fever.  Aches.  Headache.  Itching.  Fatigue. If these problems occur, they usually begin soon after the shot and last 1 or 2 days. Moderate problems following inactivated  flu vaccine:  Young children who get inactivated flu vaccine and pneumococcal vaccine (PCV13) at the same time may be at increased risk for seizures caused by fever. Ask your doctor for more information. Tell your doctor if a child who is getting flu vaccine has ever had a seizure. Severe problems following inactivated flu vaccine:  A severe allergic reaction could occur after any vaccine (estimated less than 1 in a million doses).  There is a small possibility that inactivated flu vaccine could be associated with Guillan Barr Syndrome (GBS), no more than 1 or 2 cases per million people vaccinated. This is much lower than the risk of severe complications from flu, which can be prevented by flu vaccine. The safety of vaccines is always being monitored. For more information, visit: http://floyd.org/ WHAT IF THERE IS A SERIOUS REACTION? What should I look for?  Look for anything that concerns you, such as signs of a severe allergic reaction, very high fever, or behavior changes. Signs of a severe allergic reaction can include hives, swelling of the face and throat, difficulty breathing, a fast heartbeat, dizziness, and weakness. These would start a few minutes to a few hours after the vaccination. What should I do?  If you think it is a severe allergic reaction or other emergency that cannot wait, call 9 1 1  or get the  person to the nearest hospital. Otherwise, call your doctor.  Afterward, the reaction should be reported to the Vaccine Adverse Event Reporting System (VAERS). Your doctor might file this report, or you can do it yourself through the VAERS website at www.vaers.LAgents.no, or by calling 1-(972) 560-7211. VAERS is only for reporting reactions. They do not give medical advice. THE NATIONAL VACCINE INJURY COMPENSATION PROGRAM The National Vaccine Injury Compensation Program (VICP) is a federal program that was created to compensate people who may have been injured by certain vaccines. Persons who believe they may have been injured by a vaccine can learn about the program and about filing a claim by calling 1-(346)624-9279 or visiting the VICP website at SpiritualWord.at HOW CAN I LEARN MORE?  Ask your doctor.  Call your local or state health department.  Contact the Centers for Disease Control and Prevention (CDC):  Call 414-578-0695 (1-800-CDC-INFO) or  Visit CDC's website at BiotechRoom.com.cy CDC Inactivated Influenza Vaccine Interim VIS (01/03/12) Document Released: 03/21/2006 Document Revised: 02/19/2012 Document Reviewed: 01/03/2012 [facility] Patient Information 2014 [place], [place].

## 2013-02-19 LAB — URINE CULTURE
Colony Count: NO GROWTH
Organism ID, Bacteria: NO GROWTH

## 2013-03-04 ENCOUNTER — Ambulatory Visit (INDEPENDENT_AMBULATORY_CARE_PROVIDER_SITE_OTHER): Payer: Medicaid Other | Admitting: Obstetrics & Gynecology

## 2013-03-04 VITALS — BP 114/75 | Temp 98.1°F | Wt 176.6 lb

## 2013-03-04 DIAGNOSIS — Z3480 Encounter for supervision of other normal pregnancy, unspecified trimester: Secondary | ICD-10-CM

## 2013-03-04 DIAGNOSIS — Z3482 Encounter for supervision of other normal pregnancy, second trimester: Secondary | ICD-10-CM

## 2013-03-04 DIAGNOSIS — O99891 Other specified diseases and conditions complicating pregnancy: Secondary | ICD-10-CM

## 2013-03-04 DIAGNOSIS — R8271 Bacteriuria: Secondary | ICD-10-CM

## 2013-03-04 DIAGNOSIS — N390 Urinary tract infection, site not specified: Secondary | ICD-10-CM

## 2013-03-04 LAB — POCT URINALYSIS DIPSTICK
Bilirubin, UA: NEGATIVE
Blood, UA: NEGATIVE
Glucose, UA: NEGATIVE
Ketones, UA: NEGATIVE
Nitrite, UA: NEGATIVE
Spec Grav, UA: 1.02
Urobilinogen, UA: NEGATIVE
pH, UA: 7

## 2013-03-04 NOTE — Progress Notes (Signed)
Pulse 80 Doing well. 

## 2013-03-05 ENCOUNTER — Encounter: Payer: Self-pay | Admitting: Obstetrics & Gynecology

## 2013-03-18 ENCOUNTER — Ambulatory Visit (INDEPENDENT_AMBULATORY_CARE_PROVIDER_SITE_OTHER): Payer: Medicaid Other | Admitting: Obstetrics & Gynecology

## 2013-03-18 VITALS — BP 121/81 | Temp 98.1°F | Wt 180.0 lb

## 2013-03-18 DIAGNOSIS — Z3483 Encounter for supervision of other normal pregnancy, third trimester: Secondary | ICD-10-CM

## 2013-03-18 DIAGNOSIS — Z3480 Encounter for supervision of other normal pregnancy, unspecified trimester: Secondary | ICD-10-CM

## 2013-03-18 LAB — POCT URINALYSIS DIPSTICK
Bilirubin, UA: NEGATIVE
Blood, UA: NEGATIVE
Glucose, UA: NEGATIVE
Ketones, UA: NEGATIVE
Leukocytes, UA: NEGATIVE
Nitrite, UA: NEGATIVE
Protein, UA: NEGATIVE
Spec Grav, UA: 1.025
Urobilinogen, UA: NEGATIVE
pH, UA: 6

## 2013-03-18 NOTE — Progress Notes (Addendum)
P 88 Patient states she has started feeling weak and her nausea has returned. Patient states she is having trouble with her appetite.

## 2013-04-01 ENCOUNTER — Ambulatory Visit (INDEPENDENT_AMBULATORY_CARE_PROVIDER_SITE_OTHER): Payer: Medicaid Other | Admitting: Obstetrics & Gynecology

## 2013-04-01 VITALS — BP 115/77 | Temp 98.0°F | Wt 181.0 lb

## 2013-04-01 DIAGNOSIS — F32A Depression, unspecified: Secondary | ICD-10-CM

## 2013-04-01 DIAGNOSIS — F329 Major depressive disorder, single episode, unspecified: Secondary | ICD-10-CM

## 2013-04-01 DIAGNOSIS — F3289 Other specified depressive episodes: Secondary | ICD-10-CM

## 2013-04-01 DIAGNOSIS — Z3483 Encounter for supervision of other normal pregnancy, third trimester: Secondary | ICD-10-CM

## 2013-04-01 DIAGNOSIS — Z3480 Encounter for supervision of other normal pregnancy, unspecified trimester: Secondary | ICD-10-CM

## 2013-04-01 LAB — POCT URINALYSIS DIPSTICK
Bilirubin, UA: NEGATIVE
Blood, UA: NEGATIVE
Glucose, UA: NEGATIVE
Ketones, UA: NEGATIVE
Nitrite, UA: NEGATIVE
Protein, UA: NEGATIVE
Spec Grav, UA: 1.01
Urobilinogen, UA: NEGATIVE
pH, UA: 8

## 2013-04-01 NOTE — Progress Notes (Signed)
P 72 Patient reports she is having the same pain as before. Patient is having contractions that come and go- low. Patient is having headache and nausea all week. Patient is having reflux with no appetite. Patient reports she has been depressed- see scale 24.  Offer referral to [facility] for depressive symptoms.

## 2013-04-02 ENCOUNTER — Other Ambulatory Visit: Payer: Self-pay | Admitting: *Deleted

## 2013-04-02 ENCOUNTER — Encounter: Payer: Self-pay | Admitting: Obstetrics & Gynecology

## 2013-04-02 DIAGNOSIS — Z3480 Encounter for supervision of other normal pregnancy, unspecified trimester: Secondary | ICD-10-CM

## 2013-04-02 NOTE — Patient Instructions (Signed)

## 2013-04-07 ENCOUNTER — Encounter: Payer: Self-pay | Admitting: Obstetrics & Gynecology

## 2013-04-07 ENCOUNTER — Ambulatory Visit (INDEPENDENT_AMBULATORY_CARE_PROVIDER_SITE_OTHER): Payer: Medicaid Other

## 2013-04-07 DIAGNOSIS — O09899 Supervision of other high risk pregnancies, unspecified trimester: Secondary | ICD-10-CM

## 2013-04-07 DIAGNOSIS — Z3480 Encounter for supervision of other normal pregnancy, unspecified trimester: Secondary | ICD-10-CM

## 2013-04-07 LAB — US OB DETAIL + 14 WK

## 2013-04-09 ENCOUNTER — Encounter: Payer: Self-pay | Admitting: Obstetrics & Gynecology

## 2013-04-09 LAB — US OB DETAIL + 14 WK

## 2013-04-15 ENCOUNTER — Ambulatory Visit (INDEPENDENT_AMBULATORY_CARE_PROVIDER_SITE_OTHER): Payer: Medicaid Other | Admitting: Obstetrics & Gynecology

## 2013-04-15 VITALS — BP 131/84 | Temp 97.7°F | Wt 185.0 lb

## 2013-04-15 DIAGNOSIS — Z3480 Encounter for supervision of other normal pregnancy, unspecified trimester: Secondary | ICD-10-CM

## 2013-04-15 DIAGNOSIS — Z3483 Encounter for supervision of other normal pregnancy, third trimester: Secondary | ICD-10-CM

## 2013-04-15 LAB — POCT URINALYSIS DIPSTICK
Bilirubin, UA: NEGATIVE
Blood, UA: NEGATIVE
Glucose, UA: NEGATIVE
Ketones, UA: NEGATIVE
Leukocytes, UA: NEGATIVE
Nitrite, UA: NEGATIVE
Protein, UA: NEGATIVE
Spec Grav, UA: 1.015
Urobilinogen, UA: NEGATIVE
pH, UA: 7

## 2013-04-15 NOTE — Progress Notes (Signed)
Pulse- 90 Pt states she is having pelvic pressure

## 2013-04-26 ENCOUNTER — Ambulatory Visit (INDEPENDENT_AMBULATORY_CARE_PROVIDER_SITE_OTHER): Payer: Medicaid Other | Admitting: Obstetrics & Gynecology

## 2013-04-26 ENCOUNTER — Encounter: Payer: Self-pay | Admitting: Obstetrics & Gynecology

## 2013-04-26 VITALS — BP 118/79 | Temp 98.5°F | Wt 188.4 lb

## 2013-04-26 DIAGNOSIS — O479 False labor, unspecified: Secondary | ICD-10-CM

## 2013-04-26 DIAGNOSIS — Z3483 Encounter for supervision of other normal pregnancy, third trimester: Secondary | ICD-10-CM

## 2013-04-26 DIAGNOSIS — O4703 False labor before 37 completed weeks of gestation, third trimester: Secondary | ICD-10-CM

## 2013-04-26 DIAGNOSIS — Z3480 Encounter for supervision of other normal pregnancy, unspecified trimester: Secondary | ICD-10-CM

## 2013-04-26 LAB — POCT URINALYSIS DIPSTICK
Bilirubin, UA: NEGATIVE
Blood, UA: NEGATIVE
Glucose, UA: NEGATIVE
Ketones, UA: NEGATIVE
Leukocytes, UA: NEGATIVE
Nitrite, UA: NEGATIVE
Protein, UA: NEGATIVE
Spec Grav, UA: 1.015
Urobilinogen, UA: NEGATIVE
pH, UA: 7

## 2013-04-26 NOTE — Progress Notes (Signed)
Pulse- 80 Pt states she is having pressure in her lower abdomen. Pt states she is having contractions that come and go. Pt states she is having increased headaches and nose bleeds. C/O decreased fetal movement.

## 2013-04-26 NOTE — Patient Instructions (Signed)
Fetal Movement Counts Patient Name: __________________________________________________ Patient Due Date: ____________________ Performing a fetal movement count is highly recommended in high-risk pregnancies, but it is good for every pregnant woman to do. Your caregiver may ask you to start counting fetal movements at 28 weeks of the pregnancy. Fetal movements often increase:  After eating a full meal.  After physical activity.  After eating or drinking something sweet or cold.  At rest. Pay attention to when you feel the baby is most active. This will help you notice a pattern of your baby's sleep and wake cycles and what factors contribute to an increase in fetal movement. It is important to perform a fetal movement count at the same time each day when your baby is normally most active.  HOW TO COUNT FETAL MOVEMENTS 1. Find a quiet and comfortable area to sit or lie down on your left side. Lying on your left side provides the best blood and oxygen circulation to your baby. 2. Write down the day and time on a sheet of paper or in a journal. 3. Start counting kicks, flutters, swishes, rolls, or jabs in a 2 hour period. You should feel at least 10 movements within 2 hours. 4. If you do not feel 10 movements in 2 hours, wait 2 3 hours and count again. Look for a change in the pattern or not enough counts in 2 hours. SEEK MEDICAL CARE IF:  You feel less than 10 counts in 2 hours, tried twice.  There is no movement in over an hour.  The pattern is changing or taking longer each day to reach 10 counts in 2 hours.  You feel the baby is not moving as he or she usually does. Date: ____________ Movements: ____________ Start time: ____________ Finish time: ____________  Date: ____________ Movements: ____________ Start time: ____________ Finish time: ____________ Date: ____________ Movements: ____________ Start time: ____________ Finish time: ____________ Date: ____________ Movements: ____________  Start time: ____________ Finish time: ____________ Date: ____________ Movements: ____________ Start time: ____________ Finish time: ____________ Date: ____________ Movements: ____________ Start time: ____________ Finish time: ____________ Date: ____________ Movements: ____________ Start time: ____________ Finish time: ____________ Date: ____________ Movements: ____________ Start time: ____________ Finish time: ____________  Date: ____________ Movements: ____________ Start time: ____________ Finish time: ____________ Date: ____________ Movements: ____________ Start time: ____________ Finish time: ____________ Date: ____________ Movements: ____________ Start time: ____________ Finish time: ____________ Date: ____________ Movements: ____________ Start time: ____________ Finish time: ____________ Date: ____________ Movements: ____________ Start time: ____________ Finish time: ____________ Date: ____________ Movements: ____________ Start time: ____________ Finish time: ____________ Date: ____________ Movements: ____________ Start time: ____________ Finish time: ____________  Date: ____________ Movements: ____________ Start time: ____________ Finish time: ____________ Date: ____________ Movements: ____________ Start time: ____________ Finish time: ____________ Date: ____________ Movements: ____________ Start time: ____________ Finish time: ____________ Date: ____________ Movements: ____________ Start time: ____________ Finish time: ____________ Date: ____________ Movements: ____________ Start time: ____________ Finish time: ____________ Date: ____________ Movements: ____________ Start time: ____________ Finish time: ____________ Date: ____________ Movements: ____________ Start time: ____________ Finish time: ____________  Date: ____________ Movements: ____________ Start time: ____________ Finish time: ____________ Date: ____________ Movements: ____________ Start time: ____________ Finish time:  ____________ Date: ____________ Movements: ____________ Start time: ____________ Finish time: ____________ Date: ____________ Movements: ____________ Start time: ____________ Finish time: ____________ Date: ____________ Movements: ____________ Start time: ____________ Finish time: ____________ Date: ____________ Movements: ____________ Start time: ____________ Finish time: ____________ Date: ____________ Movements: ____________ Start time: ____________ Finish time: ____________  Date: ____________ Movements: ____________ Start time: ____________ Finish   time: ____________ Date: ____________ Movements: ____________ Start time: ____________ Finish time: ____________ Date: ____________ Movements: ____________ Start time: ____________ Finish time: ____________ Date: ____________ Movements: ____________ Start time: ____________ Finish time: ____________ Date: ____________ Movements: ____________ Start time: ____________ Finish time: ____________ Date: ____________ Movements: ____________ Start time: ____________ Finish time: ____________ Date: ____________ Movements: ____________ Start time: ____________ Finish time: ____________  Date: ____________ Movements: ____________ Start time: ____________ Finish time: ____________ Date: ____________ Movements: ____________ Start time: ____________ Finish time: ____________ Date: ____________ Movements: ____________ Start time: ____________ Finish time: ____________ Date: ____________ Movements: ____________ Start time: ____________ Finish time: ____________ Date: ____________ Movements: ____________ Start time: ____________ Finish time: ____________ Date: ____________ Movements: ____________ Start time: ____________ Finish time: ____________ Date: ____________ Movements: ____________ Start time: ____________ Finish time: ____________  Date: ____________ Movements: ____________ Start time: ____________ Finish time: ____________ Date: ____________ Movements:  ____________ Start time: ____________ Finish time: ____________ Date: ____________ Movements: ____________ Start time: ____________ Finish time: ____________ Date: ____________ Movements: ____________ Start time: ____________ Finish time: ____________ Date: ____________ Movements: ____________ Start time: ____________ Finish time: ____________ Date: ____________ Movements: ____________ Start time: ____________ Finish time: ____________ Date: ____________ Movements: ____________ Start time: ____________ Finish time: ____________  Date: ____________ Movements: ____________ Start time: ____________ Finish time: ____________ Date: ____________ Movements: ____________ Start time: ____________ Finish time: ____________ Date: ____________ Movements: ____________ Start time: ____________ Finish time: ____________ Date: ____________ Movements: ____________ Start time: ____________ Finish time: ____________ Date: ____________ Movements: ____________ Start time: ____________ Finish time: ____________ Date: ____________ Movements: ____________ Start time: ____________ Finish time: ____________ Document Released: 06/26/2006 Document Revised: 05/13/2012 Document Reviewed: 03/23/2012 [employer] Patient Information 2014 [employer].  

## 2013-04-29 ENCOUNTER — Encounter: Payer: Medicaid Other | Admitting: Obstetrics & Gynecology

## 2013-05-03 ENCOUNTER — Other Ambulatory Visit: Payer: Self-pay | Admitting: *Deleted

## 2013-05-03 ENCOUNTER — Ambulatory Visit (INDEPENDENT_AMBULATORY_CARE_PROVIDER_SITE_OTHER): Payer: Medicaid Other | Admitting: Obstetrics & Gynecology

## 2013-05-03 ENCOUNTER — Telehealth (HOSPITAL_COMMUNITY): Payer: Self-pay | Admitting: *Deleted

## 2013-05-03 ENCOUNTER — Encounter (HOSPITAL_COMMUNITY): Payer: Self-pay | Admitting: *Deleted

## 2013-05-03 VITALS — BP 125/82 | Temp 97.9°F | Wt 187.0 lb

## 2013-05-03 DIAGNOSIS — Z3480 Encounter for supervision of other normal pregnancy, unspecified trimester: Secondary | ICD-10-CM

## 2013-05-03 LAB — POCT URINALYSIS DIPSTICK
Bilirubin, UA: NEGATIVE
Blood, UA: NEGATIVE
Glucose, UA: NEGATIVE
Ketones, UA: NEGATIVE
Leukocytes, UA: NEGATIVE
Nitrite, UA: NEGATIVE
Protein, UA: NEGATIVE
Spec Grav, UA: <=1.005
Urobilinogen, UA: NEGATIVE
pH, UA: 7

## 2013-05-03 NOTE — Telephone Encounter (Signed)
Preadmission screen  

## 2013-05-03 NOTE — Progress Notes (Signed)
Pulse 83 Pt states that she has had some pain/pressure. Pt states that she has some swelling in her feet.  H/O rapid labors. IOL at 39 weeks.

## 2013-05-03 NOTE — Patient Instructions (Signed)
Labor Induction  Labor induction is when steps are taken to cause a pregnant woman to begin the labor process. Most women go into labor on their own between 37 weeks and 42 weeks of the pregnancy. When this does not happen or when there is a medical need, methods may be used to induce labor. Labor induction causes a pregnant woman's uterus to contract. It also causes the cervix to soften (ripen), open (dilate), and thin out (efface). Usually, labor is not induced before 39 weeks of the pregnancy unless there is a problem with the baby or mother.  Before inducing labor, your health care provider will consider a number of factors, including the following:  The medical condition of you and the baby.   How many weeks along you are.   The status of the baby's lung maturity.   The condition of the cervix.   The position of the baby.  WHAT ARE THE REASONS FOR LABOR INDUCTION? Labor may be induced for the following reasons:  The health of the baby or mother is at risk.   The pregnancy is overdue by 1 week or more.   The water breaks but labor does not start on its own.   The mother has a health condition or serious illness, such as high blood pressure, infection, placental abruption, or diabetes.  The amniotic fluid amounts are low around the baby.   The baby is distressed.  Convenience or wanting the baby to be born on a certain date is not a reason for inducing labor. WHAT METHODS ARE USED FOR LABOR INDUCTION? Several methods of labor induction may be used, such as:   Prostaglandin medicine. This medicine causes the cervix to dilate and ripen. The medicine will also start contractions. It can be taken by mouth or by inserting a suppository into the vagina.   Inserting a thin tube (catheter) with a balloon on the end into the vagina to dilate the cervix. Once inserted, the balloon is expanded with water, which causes the cervix to open.   Stripping the membranes. Your health  care provider separates amniotic sac tissue from the cervix, causing the cervix to be stretched and causing the release of a hormone called progesterone. This may cause the uterus to contract. It is often done during an office visit. You will be sent home to wait for the contractions to begin. You will then come in for an induction.   Breaking the water. Your health care provider makes a hole in the amniotic sac using a small instrument. Once the amniotic sac breaks, contractions should begin. This may still take hours to see an effect.   Medicine to trigger or strengthen contractions. This medicine is given through an IV access tube inserted into a vein in your arm.  All of the methods of induction, besides stripping the membranes, will be done in the hospital. Induction is done in the hospital so that you and the baby can be carefully monitored.  HOW LONG DOES IT TAKE FOR LABOR TO BE INDUCED? Some inductions can take up to 2 3 days. Depending on the cervix, it usually takes less time. It takes longer when you are induced early in the pregnancy or if this is your first pregnancy. If a mother is still pregnant and the induction has been going on for 2 3 days, either the mother will be sent home or a cesarean delivery will be needed. WHAT ARE THE RISKS ASSOCIATED WITH LABOR INDUCTION? Some of the risks   of induction include:   Changes in fetal heart rate, such as too high, too low, or erratic.   Fetal distress.   Chance of infection for the mother and baby.   Increased chance of having a cesarean delivery.   Breaking off (abruption) of the placenta from the uterus (rare).   Uterine rupture (very rare).  When induction is needed for medical reasons, the benefits of induction may outweigh the risks. WHAT ARE SOME REASONS FOR NOT INDUCING LABOR? Labor induction should not be done if:   It is shown that your baby does not tolerate labor.   You have had previous surgeries on your  uterus, such as a myomectomy or the removal of fibroids.   Your placenta lies very low in the uterus and blocks the opening of the cervix (placenta previa).   Your baby is not in a head-down position.   The umbilical cord drops down into the birth canal in front of the baby. This could cut off the baby's blood and oxygen supply.   You have had a previous cesarean delivery.   There are unusual circumstances, such as the baby being extremely premature.  Document Released: 10/16/2006 Document Revised: 01/27/2013 Document Reviewed: 12/24/2012 [employer] Patient Information 2014 [employer].  

## 2013-05-04 ENCOUNTER — Encounter: Payer: Self-pay | Admitting: Obstetrics

## 2013-05-05 ENCOUNTER — Encounter (HOSPITAL_COMMUNITY): Payer: Self-pay | Admitting: *Deleted

## 2013-05-05 ENCOUNTER — Inpatient Hospital Stay (HOSPITAL_COMMUNITY)
Admission: AD | Admit: 2013-05-05 | Discharge: 2013-05-05 | Disposition: A | Discharge status: Home / Self Care | Payer: Medicaid Other | Source: Ambulatory Visit | Attending: Obstetrics & Gynecology | Admitting: Obstetrics & Gynecology

## 2013-05-05 DIAGNOSIS — R109 Unspecified abdominal pain: Secondary | ICD-10-CM | POA: Insufficient documentation

## 2013-05-05 DIAGNOSIS — R87612 Low grade squamous intraepithelial lesion on cytologic smear of cervix (LGSIL): Secondary | ICD-10-CM

## 2013-05-05 DIAGNOSIS — N390 Urinary tract infection, site not specified: Secondary | ICD-10-CM

## 2013-05-05 DIAGNOSIS — Z22330 Carrier of Group B streptococcus: Secondary | ICD-10-CM

## 2013-05-05 DIAGNOSIS — O99891 Other specified diseases and conditions complicating pregnancy: Secondary | ICD-10-CM | POA: Insufficient documentation

## 2013-05-05 DIAGNOSIS — O285 Abnormal chromosomal and genetic finding on antenatal screening of mother: Secondary | ICD-10-CM

## 2013-05-05 LAB — POCT FERN TEST: POCT Fern Test: NEGATIVE

## 2013-05-05 NOTE — MAU Note (Signed)
States panties have felt damp since yesterday. Denies gush or even a trickle of fluid.

## 2013-05-05 NOTE — MAU Note (Signed)
Ongoing pressure and pain, no bleeding.  Was 2cm when last checked. 4th baby.

## 2013-05-09 ENCOUNTER — Encounter (HOSPITAL_COMMUNITY): Payer: Self-pay | Admitting: *Deleted

## 2013-05-09 ENCOUNTER — Inpatient Hospital Stay (HOSPITAL_COMMUNITY)
Admission: AD | Admit: 2013-05-09 | Discharge: 2013-05-09 | Disposition: A | Discharge status: Home / Self Care | Payer: Medicaid Other | Source: Ambulatory Visit | Attending: Obstetrics | Admitting: Obstetrics

## 2013-05-09 DIAGNOSIS — R109 Unspecified abdominal pain: Secondary | ICD-10-CM | POA: Insufficient documentation

## 2013-05-09 DIAGNOSIS — O99891 Other specified diseases and conditions complicating pregnancy: Secondary | ICD-10-CM | POA: Insufficient documentation

## 2013-05-09 LAB — URINE MICROSCOPIC-ADD ON

## 2013-05-09 LAB — URINALYSIS, ROUTINE W REFLEX MICROSCOPIC
Bilirubin Urine: NEGATIVE
Glucose, UA: NEGATIVE mg/dL
Ketones, ur: NEGATIVE mg/dL
Nitrite: NEGATIVE
Protein, ur: NEGATIVE mg/dL
Specific Gravity, Urine: 1.01 (ref 1.005–1.030)
Urobilinogen, UA: 0.2 mg/dL (ref 0.0–1.0)
pH: 7 (ref 5.0–8.0)

## 2013-05-09 NOTE — MAU Note (Signed)
Patient presents with complaint of lower abdominal pain since last night.

## 2013-05-10 ENCOUNTER — Encounter: Payer: Medicaid Other | Admitting: Obstetrics & Gynecology

## 2013-05-10 ENCOUNTER — Encounter (HOSPITAL_COMMUNITY): Payer: Self-pay

## 2013-05-10 ENCOUNTER — Encounter: Payer: Self-pay | Admitting: Obstetrics

## 2013-05-10 ENCOUNTER — Inpatient Hospital Stay (HOSPITAL_COMMUNITY)
Admission: AD | Admit: 2013-05-10 | Discharge: 2013-05-12 | DRG: 775 | Disposition: A | Discharge status: Home / Self Care | Payer: Medicaid Other | Source: Ambulatory Visit | Attending: Obstetrics & Gynecology | Admitting: Obstetrics & Gynecology

## 2013-05-10 DIAGNOSIS — IMO0001 Reserved for inherently not codable concepts without codable children: Secondary | ICD-10-CM

## 2013-05-10 DIAGNOSIS — Z87891 Personal history of nicotine dependence: Secondary | ICD-10-CM

## 2013-05-10 LAB — TYPE AND SCREEN
ABO/RH(D): A POS
Antibody Screen: NEGATIVE

## 2013-05-10 LAB — CBC
HCT: 35.8 % — ABNORMAL LOW (ref 36.0–46.0)
Hemoglobin: 12 g/dL (ref 12.0–15.0)
MCH: 27.9 pg (ref 26.0–34.0)
MCHC: 33.5 g/dL (ref 30.0–36.0)
MCV: 83.3 fL (ref 78.0–100.0)
Platelets: 194 10*3/uL (ref 150–400)
RBC: 4.3 MIL/uL (ref 3.87–5.11)
RDW: 13.8 % (ref 11.5–15.5)
WBC: 11.6 10*3/uL — ABNORMAL HIGH (ref 4.0–10.5)

## 2013-05-10 LAB — US OB DETAIL + 14 WK

## 2013-05-10 LAB — ABO/RH: ABO/RH(D): A POS

## 2013-05-10 LAB — URINE CULTURE: Colony Count: 80000

## 2013-05-10 LAB — RPR: RPR Ser Ql: NONREACTIVE

## 2013-05-10 MED ORDER — DIBUCAINE 1 % RE OINT: 1.0000 "application " | TOPICAL_OINTMENT | RECTAL | Status: DC | PRN

## 2013-05-10 MED ORDER — SENNOSIDES-DOCUSATE SODIUM 8.6-50 MG PO TABS
2.0000 | ORAL_TABLET | ORAL | Status: DC
  Administered 2013-05-10 – 2013-05-11 (×2): 2 via ORAL
  Filled 2013-05-10 (×2): qty 2

## 2013-05-10 MED ORDER — ZOLPIDEM TARTRATE 5 MG PO TABS: 5.0000 mg | ORAL_TABLET | Freq: Every evening | ORAL | Status: DC | PRN

## 2013-05-10 MED ORDER — DIPHENHYDRAMINE HCL 25 MG PO CAPS: 25.0000 mg | ORAL_CAPSULE | Freq: Four times a day (QID) | ORAL | Status: DC | PRN

## 2013-05-10 MED ORDER — LANOLIN HYDROUS EX OINT: TOPICAL_OINTMENT | CUTANEOUS | Status: DC | PRN

## 2013-05-10 MED ORDER — LACTATED RINGERS IV SOLN
INTRAVENOUS | Status: DC
  Administered 2013-05-10: 06:00:00 via INTRAVENOUS

## 2013-05-10 MED ORDER — CITRIC ACID-SODIUM CITRATE 334-500 MG/5ML PO SOLN: 30.0000 mL | ORAL | Status: DC | PRN

## 2013-05-10 MED ORDER — ONDANSETRON HCL 4 MG PO TABS: 4.0000 mg | ORAL_TABLET | ORAL | Status: DC | PRN

## 2013-05-10 MED ORDER — NALBUPHINE SYRINGE 5 MG/0.5 ML
10.0000 mg | INJECTION | INTRAMUSCULAR | Status: DC | PRN
  Administered 2013-05-10: 10 mg via INTRAVENOUS

## 2013-05-10 MED ORDER — BUTORPHANOL TARTRATE 1 MG/ML IJ SOLN: 1.0000 mg | INTRAMUSCULAR | Status: DC | PRN

## 2013-05-10 MED ORDER — TETANUS-DIPHTH-ACELL PERTUSSIS 5-2.5-18.5 LF-MCG/0.5 IM SUSP: 0.5000 mL | Freq: Once | INTRAMUSCULAR | Status: DC

## 2013-05-10 MED ORDER — TERBUTALINE SULFATE 1 MG/ML IJ SOLN: 0.2500 mg | Freq: Once | INTRAMUSCULAR | Status: DC | PRN

## 2013-05-10 MED ORDER — LIDOCAINE HCL (PF) 1 % IJ SOLN
30.0000 mL | INTRAMUSCULAR | Status: AC | PRN
  Administered 2013-05-10: 30 mL via SUBCUTANEOUS
  Filled 2013-05-10 (×2): qty 30

## 2013-05-10 MED ORDER — OXYCODONE-ACETAMINOPHEN 5-325 MG PO TABS: 1.0000 | ORAL_TABLET | ORAL | Status: DC | PRN

## 2013-05-10 MED ORDER — PROMETHAZINE HCL 25 MG/ML IJ SOLN
25.0000 mg | Freq: Four times a day (QID) | INTRAMUSCULAR | Status: DC | PRN
  Administered 2013-05-10: 25 mg via INTRAMUSCULAR
  Filled 2013-05-10: qty 1

## 2013-05-10 MED ORDER — MAGNESIUM HYDROXIDE 400 MG/5ML PO SUSP: 30.0000 mL | ORAL | Status: DC | PRN

## 2013-05-10 MED ORDER — NALOXONE HCL 0.4 MG/ML IJ SOLN
INTRAMUSCULAR | Status: AC
  Filled 2013-05-10: qty 1

## 2013-05-10 MED ORDER — IBUPROFEN 600 MG PO TABS
600.0000 mg | ORAL_TABLET | Freq: Four times a day (QID) | ORAL | Status: DC
  Administered 2013-05-10 – 2013-05-12 (×8): 600 mg via ORAL
  Filled 2013-05-10 (×8): qty 1

## 2013-05-10 MED ORDER — ONDANSETRON HCL 4 MG/2ML IJ SOLN
4.0000 mg | Freq: Four times a day (QID) | INTRAMUSCULAR | Status: DC | PRN
Start: 2013-05-10 — End: 2013-05-10

## 2013-05-10 MED ORDER — ACETAMINOPHEN 325 MG PO TABS: 650.0000 mg | ORAL_TABLET | ORAL | Status: DC | PRN

## 2013-05-10 MED ORDER — ONDANSETRON HCL 4 MG/2ML IJ SOLN: 4.0000 mg | INTRAMUSCULAR | Status: DC | PRN

## 2013-05-10 MED ORDER — OXYTOCIN BOLUS FROM INFUSION: 500.0000 mL | INTRAVENOUS | Status: DC

## 2013-05-10 MED ORDER — OXYTOCIN 40 UNITS IN LACTATED RINGERS INFUSION - SIMPLE MED: 1.0000 m[IU]/min | INTRAVENOUS | Status: DC

## 2013-05-10 MED ORDER — BENZOCAINE-MENTHOL 20-0.5 % EX AERO: 1.0000 "application " | INHALATION_SPRAY | CUTANEOUS | Status: DC | PRN

## 2013-05-10 MED ORDER — ONDANSETRON HCL 4 MG/2ML IJ SOLN: 4.0000 mg | Freq: Four times a day (QID) | INTRAMUSCULAR | Status: DC | PRN

## 2013-05-10 MED ORDER — SODIUM CHLORIDE 0.9 % IV SOLN
2.0000 g | Freq: Four times a day (QID) | INTRAVENOUS | Status: DC
  Administered 2013-05-10: 2 g via INTRAVENOUS
  Filled 2013-05-10 (×2): qty 2000

## 2013-05-10 MED ORDER — HYDROXYZINE HCL 50 MG PO TABS: 50.0000 mg | ORAL_TABLET | Freq: Four times a day (QID) | ORAL | Status: DC | PRN

## 2013-05-10 MED ORDER — LACTATED RINGERS IV SOLN: 500.0000 mL | INTRAVENOUS | Status: DC | PRN

## 2013-05-10 MED ORDER — IBUPROFEN 600 MG PO TABS: 600.0000 mg | ORAL_TABLET | Freq: Four times a day (QID) | ORAL | Status: DC | PRN

## 2013-05-10 MED ORDER — FERROUS SULFATE 325 (65 FE) MG PO TABS
325.0000 mg | ORAL_TABLET | Freq: Two times a day (BID) | ORAL | Status: DC
  Administered 2013-05-10 – 2013-05-12 (×4): 325 mg via ORAL
  Filled 2013-05-10 (×4): qty 1

## 2013-05-10 MED ORDER — MEASLES, MUMPS & RUBELLA VAC ~~LOC~~ INJ: 0.5000 mL | INJECTION | Freq: Once | SUBCUTANEOUS | Status: DC

## 2013-05-10 MED ORDER — OXYTOCIN 40 UNITS IN LACTATED RINGERS INFUSION - SIMPLE MED
62.5000 mL/h | INTRAVENOUS | Status: DC
  Administered 2013-05-10: 999 mL/h via INTRAVENOUS
  Filled 2013-05-10: qty 1000

## 2013-05-10 MED ORDER — PRENATAL MULTIVITAMIN CH
1.0000 | ORAL_TABLET | Freq: Every day | ORAL | Status: DC
  Administered 2013-05-11: 1 via ORAL
  Filled 2013-05-10: qty 1

## 2013-05-10 MED ORDER — OXYCODONE-ACETAMINOPHEN 5-325 MG PO TABS
1.0000 | ORAL_TABLET | ORAL | Status: DC | PRN
  Administered 2013-05-10 (×2): 1 via ORAL
  Filled 2013-05-10 (×2): qty 1

## 2013-05-10 MED ORDER — NALBUPHINE SYRINGE 5 MG/0.5 ML
10.0000 mg | INJECTION | Freq: Four times a day (QID) | INTRAMUSCULAR | Status: DC | PRN
  Administered 2013-05-10: 10 mg via INTRAMUSCULAR
  Filled 2013-05-10 (×2): qty 1

## 2013-05-10 MED ORDER — LIDOCAINE HCL (PF) 1 % IJ SOLN: 30.0000 mL | INTRAMUSCULAR | Status: DC | PRN

## 2013-05-10 MED ORDER — LACTATED RINGERS IV SOLN: INTRAVENOUS | Status: DC

## 2013-05-10 MED ORDER — WITCH HAZEL-GLYCERIN EX PADS: 1.0000 "application " | MEDICATED_PAD | CUTANEOUS | Status: DC | PRN

## 2013-05-10 MED ORDER — OXYTOCIN 40 UNITS IN LACTATED RINGERS INFUSION - SIMPLE MED: 62.5000 mL/h | INTRAVENOUS | Status: DC

## 2013-05-10 MED ORDER — NALBUPHINE HCL 10 MG/ML IJ SOLN
10.0000 mg | INTRAMUSCULAR | Status: DC | PRN
  Filled 2013-05-10: qty 1

## 2013-05-10 NOTE — H&P (Addendum)
Andrea Burns is a 27 y.o. female presenting for UC's. Maternal Medical History:  Reason for admission: Contractions.  27 yo G4 P3.  EDC 05-21-13.  Presents with UC's.  Contractions: Onset was yesterday.    Fetal activity: Perceived fetal activity is normal.   Last perceived fetal movement was within the past hour.    Prenatal complications: no prenatal complications Prenatal Complications - Diabetes: none.    OB History   Grav Para Term Preterm Abortions TAB SAB Ect Mult Living   4 3 3       3      Past Medical History  Diagnosis Date  . Medical history non-contributory    Past Surgical History  Procedure Laterality Date  . No past surgeries    . Iud removal     Family History: family history includes Cancer in her paternal grandmother; Diabetes in her father and mother; Hypertension in her mother. Social History:  reports that she quit smoking about 8 years ago. She has never used smokeless tobacco. She reports that she does not drink alcohol or use illicit drugs.   Prenatal Transfer Tool  Maternal Diabetes: No Genetic Screening: Abnormal screen for Trisomy 21. Maternal Ultrasounds/Referrals: Normal Fetal Ultrasounds or other Referrals:  None Maternal Substance Abuse:  No Significant Maternal Medications:  None Significant Maternal Lab Results:  None Other Comments:  None  Review of Systems  All other systems reviewed and are negative.    Dilation: 6 Effacement (%): 90 Station: -1 Blood pressure 134/88, pulse 82, temperature 98 F (36.7 C), temperature source Oral, resp. rate 18, height 5\' 2"  (1.575 m), weight 191 lb 6 oz (86.807 kg), last menstrual period 08/14/2012, SpO2 99.00%. Maternal Exam:  Abdomen: Patient reports no abdominal tenderness. Fetal presentation: vertex  Introitus: Normal vulva. Normal vagina.  Pelvis: adequate for delivery.   Cervix: Cervix evaluated by digital exam.     Physical Exam  Nursing note and vitals  reviewed. Constitutional: She is oriented to person, place, and time. She appears well-developed and well-nourished.  HENT:  Head: Normocephalic and atraumatic.  Eyes: Conjunctivae are normal. Pupils are equal, round, and reactive to light.  Neck: Normal range of motion. Neck supple.  Cardiovascular: Normal rate and regular rhythm.   Respiratory: Effort normal.  GI: Soft.  Genitourinary: Vagina normal and uterus normal.  Musculoskeletal: Normal range of motion.  Neurological: She is alert and oriented to person, place, and time.  Skin: Skin is warm and dry.  Psychiatric: She has a normal mood and affect. Her behavior is normal. Judgment and thought content normal.    Prenatal labs: ABO, Rh: A/POS/-- (04/10 1359) Antibody: NEG (04/10 1359) Rubella: 1.52 (04/10 1359) RPR: NON REAC (09/10 1209)  HBsAg: NEGATIVE (04/10 1359)  HIV: NON REACTIVE (09/10 1208)  GBS:     Assessment/Plan: 38 weeks.  Active labor.  Admit.   Kendell Gammon A 05/10/2013, 5:33 AM

## 2013-05-10 NOTE — Progress Notes (Signed)
Andrea Burns is a 27 y.o. G4P3003 at [redacted]w[redacted]d by LMP admitted for active labor  Subjective:   Objective: BP 141/83  Pulse 72  Temp(Src) 97.7 F (36.5 C) (Oral)  Resp 18  Ht 5\' 2"  (1.575 m)  Wt 191 lb (86.637 kg)  BMI 34.93 kg/m2  SpO2 99%  LMP 08/14/2012      FHT:  FHR: 140-150 bpm, variability: moderate,  accelerations:  Present,  decelerations:  Absent UC:   regular, every 3-4 minutes SVE:   Dilation: 10 Effacement (%): 100 Station: +1 Exam by::  (Dr Clearance Coots)  Labs: Lab Results  Component Value Date   WBC 11.6* 05/10/2013   HGB 12.0 05/10/2013   HCT 35.8* 05/10/2013   MCV 83.3 05/10/2013   PLT 194 05/10/2013    Assessment / Plan: Spontaneous labor, progressing normally  Labor: Progressing normally Preeclampsia:  n/a Fetal Wellbeing:  Category I Pain Control:  Nubain I/D:  n/a Anticipated MOD:  NSVD  HARPER,CHARLES A 05/10/2013, 7:38 AM

## 2013-05-10 NOTE — Lactation Note (Signed)
This note was copied from the chart of Andrea Finesse Fielder. Lactation Consultation Note  Initial visit at 15 hours of age.  [facility] LC resources given and discussed.  Mom holding baby skin to skin.  Encouraged cue feeding and hand expression.  Baby has not fed well yet.  Mom hand expresses a little colostrum.  Discussed future spoon feeding if mom desires.  Mom sleepy so she will call for assist as needed.   Patient Name: Andrea Burns [id number] Date: 05/10/2013 Reason for consult: Initial assessment   Maternal Data Formula Feeding for Exclusion: No Infant to breast within first hour of birth: Yes Has patient been taught Hand Expression?: Yes Does the patient have breastfeeding experience prior to this delivery?: Yes  Feeding Feeding Type: Breast Fed  LATCH Score/Interventions                      Lactation Tools Discussed/Used     Consult Status Consult Status: Follow-up Date: 05/11/13 Follow-up type: In-patient    Jannifer Rodney 05/10/2013, 11:21 PM

## 2013-05-10 NOTE — MAU Note (Signed)
Started having pinkish mucus d/c and contractions and pressure- started 6 pm, getting more painful.

## 2013-05-10 NOTE — MAU Note (Signed)
contractions 

## 2013-05-11 NOTE — Progress Notes (Signed)
Post Partum Day 1 Subjective: no complaints, up ad lib, voiding, tolerating PO and + flatus  Objective: Blood pressure 102/66, pulse 71, temperature 98.5 F (36.9 C), temperature source Oral, resp. rate 18, height 5\' 2"  (1.575 m), weight 191 lb (86.637 kg), last menstrual period 08/14/2012, SpO2 99.00%, unknown if currently breastfeeding.  Physical Exam:  General: alert and no distress Lochia: appropriate Uterine Fundus: firm Incision: none DVT Evaluation: No evidence of DVT seen on physical exam.   Recent Labs  05/10/13 0525  HGB 12.0  HCT 35.8*    Assessment/Plan: Plan for discharge tomorrow   LOS: 1 day   Andrea Burns A 05/11/2013, 8:31 AM

## 2013-05-11 NOTE — Progress Notes (Signed)
UR chart review completed.  

## 2013-05-12 MED ORDER — OXYCODONE-ACETAMINOPHEN 5-325 MG PO TABS: 1.0000 | ORAL_TABLET | ORAL | Status: DC | PRN

## 2013-05-12 MED ORDER — IBUPROFEN 600 MG PO TABS: 600.0000 mg | ORAL_TABLET | Freq: Four times a day (QID) | ORAL | Status: DC | PRN

## 2013-05-12 NOTE — Discharge Summary (Signed)
Obstetric Discharge Summary Reason for Admission: onset of labor Prenatal Procedures: ultrasound Intrapartum Procedures: spontaneous vaginal delivery Postpartum Procedures: none Complications-Operative and Postpartum: none Hemoglobin  Date Value Range Status  05/10/2013 12.0  12.0 - 15.0 g/dL Final     HCT  Date Value Range Status  05/10/2013 35.8* 36.0 - 46.0 % Final    Physical Exam:  General: alert Lochia: appropriate Uterine Fundus: firm Incision: none DVT Evaluation: No evidence of DVT seen on physical exam.  Discharge Diagnoses: Term Pregnancy-delivered  Discharge Information: Date: 05/12/2013 Activity: pelvic rest Diet: routine Medications: PNV, Ibuprofen, Colace and Percocet Condition: stable Instructions: refer to practice specific booklet Discharge to: home Follow-up Information   Follow up with Antionette Char A, MD In 2 weeks.   Specialty:  Obstetrics and Gynecology   Contact information:   [address] [place] [address] [phone]       Newborn Data: Live born female  Birth Weight: 6 lb 15.3 oz (3155 g) APGAR: 9, 9  Home with mother.  Lu Paradise A 05/12/2013, 8:46 AM

## 2013-05-12 NOTE — Progress Notes (Signed)
Post Partum Day 2 Subjective: no complaints  Objective: Blood pressure 127/86, pulse 72, temperature 97.8 F (36.6 C), temperature source Oral, resp. rate 17, height 5\' 2"  (1.575 m), weight 191 lb (86.637 kg), last menstrual period 08/14/2012, SpO2 98.00%, unknown if currently breastfeeding.  Physical Exam:  General: alert and no distress Lochia: appropriate Uterine Fundus: firm Incision: none DVT Evaluation: No evidence of DVT seen on physical exam.   Recent Labs  05/10/13 0525  HGB 12.0  HCT 35.8*    Assessment/Plan: Discharge home   LOS: 2 days   Andrea Burns A 05/12/2013, 8:42 AM

## 2013-05-17 ENCOUNTER — Inpatient Hospital Stay (HOSPITAL_COMMUNITY): Admission: RE | Admit: 2013-05-17 | Payer: Medicaid Other | Source: Ambulatory Visit

## 2013-05-27 ENCOUNTER — Encounter: Payer: Self-pay | Admitting: Obstetrics & Gynecology

## 2013-05-27 ENCOUNTER — Ambulatory Visit (INDEPENDENT_AMBULATORY_CARE_PROVIDER_SITE_OTHER): Payer: Medicaid Other | Admitting: Obstetrics & Gynecology

## 2013-05-27 NOTE — Progress Notes (Signed)
  Subjective:     Andrea Burns is a 27 y.o. female who presents for a postpartum visit. She is 2 weeks postpartum following a spontaneous vaginal delivery. I have fully reviewed the prenatal and intrapartum course. The delivery was at 39 gestational weeks. Outcome: spontaneous vaginal delivery. Anesthesia: none. Postpartum course has been doing well. Pt states that she has been having bad headaches since delivery. Pt states that her ibuprofen hasn't really helped. Baby's course has been doing well. Baby is feeding by breast. Bleeding thin lochia. Bowel function is has had some constipation. Bladder function is normal. Patient is not sexually active. Contraception method is none. Pt would like to discuss birth control options. Postpartum depression screening: negative.  The following portions of the patient's history were reviewed and updated as appropriate: allergies, current medications, past family history, past medical history, past social history, past surgical history and problem list.  Review of Systems Pertinent items are noted in HPI.   Objective:    BP 119/81  Pulse 59  Temp(Src) 98 F (36.7 C)       No exam today Assessment:     Normal postpartum exam.   Plan:    Contraception: plans Nexplanon  Follow up for Nexplanon insertion.

## 2013-05-27 NOTE — Patient Instructions (Signed)
Etonogestrel implant What is this medicine? ETONOGESTREL (et oh noe JES trel) is a contraceptive (birth control) device. It is used to prevent pregnancy. It can be used for up to 3 years. This medicine may be used for other purposes; ask your health care provider or pharmacist if you have questions. COMMON BRAND NAME(S): Implanon, Nexplanon  What should I tell my health care provider before I take this medicine? They need to know if you have any of these conditions: -abnormal vaginal bleeding -blood vessel disease or blood clots -cancer of the breast, cervix, or liver -depression -diabetes -gallbladder disease -headaches -heart disease or recent heart attack -high blood pressure -high cholesterol -kidney disease -liver disease -renal disease -seizures -tobacco smoker -an unusual or allergic reaction to etonogestrel, other hormones, anesthetics or antiseptics, medicines, foods, dyes, or preservatives -pregnant or trying to get pregnant -breast-feeding How should I use this medicine? This device is inserted just under the skin on the inner side of your upper arm by a health care professional. Talk to your pediatrician regarding the use of this medicine in children. Special care may be needed. Overdosage: If you think you've taken too much of this medicine contact a poison control center or emergency room at once. Overdosage: If you think you have taken too much of this medicine contact a poison control center or emergency room at once. NOTE: This medicine is only for you. Do not share this medicine with others. What if I miss a dose? This does not apply. What may interact with this medicine? Do not take this medicine with any of the following medications: -amprenavir -bosentan -fosamprenavir This medicine may also interact with the following medications: -barbiturate medicines for inducing sleep or treating seizures -certain medicines for fungal infections like ketoconazole and  itraconazole -griseofulvin -medicines to treat seizures like carbamazepine, felbamate, oxcarbazepine, phenytoin, topiramate -modafinil -phenylbutazone -rifampin -some medicines to treat HIV infection like atazanavir, indinavir, lopinavir, nelfinavir, tipranavir, ritonavir -St. John's wort This list may not describe all possible interactions. Give your health care provider a list of all the medicines, herbs, non-prescription drugs, or dietary supplements you use. Also tell them if you smoke, drink alcohol, or use illegal drugs. Some items may interact with your medicine. What should I watch for while using this medicine? This product does not protect you against HIV infection (AIDS) or other sexually transmitted diseases. You should be able to feel the implant by pressing your fingertips over the skin where it was inserted. Tell your doctor if you cannot feel the implant. What side effects may I notice from receiving this medicine? Side effects that you should report to your doctor or health care professional as soon as possible: -allergic reactions like skin rash, itching or hives, swelling of the face, lips, or tongue -breast lumps -changes in vision -confusion, trouble speaking or understanding -dark urine -depressed mood -general ill feeling or flu-like symptoms -light-colored stools -loss of appetite, nausea -right upper belly pain -severe headaches -severe pain, swelling, or tenderness in the abdomen -shortness of breath, chest pain, swelling in a leg -signs of pregnancy -sudden numbness or weakness of the face, arm or leg -trouble walking, dizziness, loss of balance or coordination -unusual vaginal bleeding, discharge -unusually weak or tired -yellowing of the eyes or skin Side effects that usually do not require medical attention (Report these to your doctor or health care professional if they continue or are bothersome.): -acne -breast pain -changes in  weight -cough -fever or chills -headache -irregular menstrual bleeding -itching, burning,   and vaginal discharge -pain or difficulty passing urine -sore throat This list may not describe all possible side effects. Call your doctor for medical advice about side effects. You may report side effects to FDA at 1-800-FDA-1088. Where should I keep my medicine? This drug is given in a hospital or clinic and will not be stored at home. NOTE: This sheet is a summary. It may not cover all possible information. If you have questions about this medicine, talk to your doctor, pharmacist, or health care provider.  2014, Elsevier/Gold Standard. (2011-12-02 15:37:45)  

## 2013-06-11 ENCOUNTER — Ambulatory Visit (INDEPENDENT_AMBULATORY_CARE_PROVIDER_SITE_OTHER): Payer: Medicaid Other | Admitting: Advanced Practice Midwife

## 2013-06-11 ENCOUNTER — Encounter: Payer: Self-pay | Admitting: Advanced Practice Midwife

## 2013-06-11 VITALS — BP 112/78 | HR 67 | Temp 97.7°F | Ht 62.0 in | Wt 170.0 lb

## 2013-06-11 DIAGNOSIS — Z3202 Encounter for pregnancy test, result negative: Secondary | ICD-10-CM

## 2013-06-11 DIAGNOSIS — Z30017 Encounter for initial prescription of implantable subdermal contraceptive: Secondary | ICD-10-CM

## 2013-06-11 DIAGNOSIS — IMO0001 Reserved for inherently not codable concepts without codable children: Secondary | ICD-10-CM

## 2013-06-11 LAB — POCT URINE PREGNANCY: Preg Test, Ur: NEGATIVE

## 2013-06-11 MED ORDER — ETONOGESTREL 68 MG ~~LOC~~ IMPL: 68.0000 mg | DRUG_IMPLANT | Freq: Once | SUBCUTANEOUS | Status: DC

## 2013-06-11 NOTE — Progress Notes (Signed)
Nexplanon Procedure Note   PRE-OP DIAGNOSIS: desired long-term, reversible contraception  POST-OP DIAGNOSIS: Same  PROCEDURE: Nexplanon  placement Performing Provider: Dory HornAmy Wren CNM   Patient education prior to procedure, explained risk, benefits of Nexplanon, reviewed alternative options. Patient reported understanding. Gave consent to continue with procedure.   PROCEDURE:  Pregnancy Text :  Negative Site (check):      right arm         Sterile Preparation:   [id number] Lot # [id number] Expiration Date 11/2015  Insertion site was selected 8 - 10 cm from medial epicondyle and marked along with guiding site using sterile marker. Procedure area was prepped and draped in a sterile fashion. Nexplanon  was inserted subcutaneously.Needle was removed from the insertion site. Nexplanon capsule was palpated by provider and patient to assure satisfactory placement. Dressing applied.  Followup: The patient tolerated the procedure well without complications.  Standard post-procedure care is explained and return precautions are given.  30 min spent with patient greater than 80% spent in counseling and coordination of care.    Amy Wilson SingerWren CNM

## 2013-07-06 ENCOUNTER — Encounter: Payer: Self-pay | Admitting: Advanced Practice Midwife

## 2013-07-06 ENCOUNTER — Ambulatory Visit (INDEPENDENT_AMBULATORY_CARE_PROVIDER_SITE_OTHER): Payer: Medicaid Other | Admitting: Obstetrics

## 2013-07-06 VITALS — BP 122/74 | HR 69 | Temp 97.1°F | Ht 62.0 in | Wt 166.0 lb

## 2013-07-06 DIAGNOSIS — R52 Pain, unspecified: Secondary | ICD-10-CM

## 2013-07-06 MED ORDER — OXYCODONE HCL 10 MG PO TABS: 10.0000 mg | ORAL_TABLET | Freq: Four times a day (QID) | ORAL | Status: DC | PRN

## 2013-07-06 NOTE — Progress Notes (Signed)
Subjective:     Andrea Burns is a 28 y.o. female who presents for a postpartum visit. She is 7 weeks postpartum following a spontaneous vaginal delivery. I have fully reviewed the prenatal and intrapartum course. The delivery was at 39 gestational weeks. Outcome: spontaneous vaginal delivery. Anesthesia: none. Postpartum course has been WNL. Baby's course has been WNL. Baby is feeding by both breast and bottle - Gerber Gentle . Bleeding no bleeding. Bowel function is Normal sometimes. Bladder function is normal. Patient is sexually active. Contraception method is Nexplanon. Postpartum depression screening: Positive- Mild Depression.  The following portions of the patient's history were reviewed and updated as appropriate: allergies, current medications, past family history, past medical history, past social history, past surgical history and problem list.  Review of Systems Pertinent items are noted in HPI.   Objective:    BP 122/74  Pulse 69  Temp(Src) 97.1 F (36.2 C)  Ht 5\' 2"  (1.575 m)  Wt 166 lb (75.297 kg)  BMI 30.35 kg/m2  LMP 06/22/2013  Breastfeeding? Yes  General:  alert and no distress Breasts:  Negative Abdomen:  Soft, NT. Pelvic:  NEFG.  Uterus NSSC, NT.   Assessment:     Nomal postpartum exam. Pap smear not done at today's visit.   Plan:    1. Contraception: Nexplanon 2. Continue PNV's 3. Follow up in: several months or as needed.

## 2013-08-12 ENCOUNTER — Ambulatory Visit (INDEPENDENT_AMBULATORY_CARE_PROVIDER_SITE_OTHER): Payer: Medicaid Other | Admitting: Obstetrics

## 2013-08-12 ENCOUNTER — Emergency Department (HOSPITAL_COMMUNITY)
Admission: EM | Admit: 2013-08-12 | Discharge: 2013-08-12 | Disposition: A | Discharge status: Home / Self Care | Payer: Medicaid Other | Attending: Emergency Medicine | Admitting: Emergency Medicine

## 2013-08-12 ENCOUNTER — Encounter: Payer: Self-pay | Admitting: Obstetrics

## 2013-08-12 ENCOUNTER — Encounter (HOSPITAL_COMMUNITY): Payer: Self-pay | Admitting: Emergency Medicine

## 2013-08-12 VITALS — BP 122/80 | HR 76 | Temp 97.7°F | Wt 168.0 lb

## 2013-08-12 DIAGNOSIS — F329 Major depressive disorder, single episode, unspecified: Secondary | ICD-10-CM | POA: Diagnosis present

## 2013-08-12 DIAGNOSIS — F3289 Other specified depressive episodes: Secondary | ICD-10-CM

## 2013-08-12 DIAGNOSIS — O99345 Other mental disorders complicating the puerperium: Secondary | ICD-10-CM | POA: Insufficient documentation

## 2013-08-12 DIAGNOSIS — Z87891 Personal history of nicotine dependence: Secondary | ICD-10-CM | POA: Insufficient documentation

## 2013-08-12 DIAGNOSIS — F32A Depression, unspecified: Secondary | ICD-10-CM | POA: Diagnosis present

## 2013-08-12 DIAGNOSIS — O99340 Other mental disorders complicating pregnancy, unspecified trimester: Secondary | ICD-10-CM | POA: Diagnosis present

## 2013-08-12 DIAGNOSIS — Z3202 Encounter for pregnancy test, result negative: Secondary | ICD-10-CM | POA: Insufficient documentation

## 2013-08-12 DIAGNOSIS — F53 Postpartum depression: Secondary | ICD-10-CM

## 2013-08-12 HISTORY — DX: Major depressive disorder, single episode, unspecified: F32.9

## 2013-08-12 HISTORY — DX: Anxiety disorder, unspecified: F41.9

## 2013-08-12 HISTORY — DX: Depression, unspecified: F32.A

## 2013-08-12 LAB — URINE MICROSCOPIC-ADD ON

## 2013-08-12 LAB — CBC WITH DIFFERENTIAL/PLATELET
Basophils Absolute: 0 10*3/uL (ref 0.0–0.1)
Basophils Relative: 0 % (ref 0–1)
Eosinophils Absolute: 0.1 10*3/uL (ref 0.0–0.7)
Eosinophils Relative: 1 % (ref 0–5)
HCT: 38.4 % (ref 36.0–46.0)
Hemoglobin: 13.4 g/dL (ref 12.0–15.0)
Lymphocytes Relative: 22 % (ref 12–46)
Lymphs Abs: 2.1 10*3/uL (ref 0.7–4.0)
MCH: 29.6 pg (ref 26.0–34.0)
MCHC: 34.9 g/dL (ref 30.0–36.0)
MCV: 84.8 fL (ref 78.0–100.0)
Monocytes Absolute: 0.5 10*3/uL (ref 0.1–1.0)
Monocytes Relative: 5 % (ref 3–12)
Neutro Abs: 6.9 10*3/uL (ref 1.7–7.7)
Neutrophils Relative %: 72 % (ref 43–77)
Platelets: 285 10*3/uL (ref 150–400)
RBC: 4.53 MIL/uL (ref 3.87–5.11)
RDW: 13.2 % (ref 11.5–15.5)
WBC: 9.6 10*3/uL (ref 4.0–10.5)

## 2013-08-12 LAB — RAPID URINE DRUG SCREEN, HOSP PERFORMED
Amphetamines: NOT DETECTED
Barbiturates: NOT DETECTED
Benzodiazepines: NOT DETECTED
Cocaine: NOT DETECTED
Opiates: NOT DETECTED
Tetrahydrocannabinol: NOT DETECTED

## 2013-08-12 LAB — URINALYSIS, ROUTINE W REFLEX MICROSCOPIC
Bilirubin Urine: NEGATIVE
Glucose, UA: NEGATIVE mg/dL
Hgb urine dipstick: NEGATIVE
Ketones, ur: NEGATIVE mg/dL
Nitrite: NEGATIVE
Protein, ur: NEGATIVE mg/dL
Specific Gravity, Urine: 1.025 (ref 1.005–1.030)
Urobilinogen, UA: 1 mg/dL (ref 0.0–1.0)
pH: 6 (ref 5.0–8.0)

## 2013-08-12 LAB — COMPREHENSIVE METABOLIC PANEL
ALT: 51 U/L — ABNORMAL HIGH (ref 0–35)
AST: 44 U/L — ABNORMAL HIGH (ref 0–37)
Albumin: 4.6 g/dL (ref 3.5–5.2)
Alkaline Phosphatase: 59 U/L (ref 39–117)
BUN: 7 mg/dL (ref 6–23)
CO2: 23 mEq/L (ref 19–32)
Calcium: 9.7 mg/dL (ref 8.4–10.5)
Chloride: 100 mEq/L (ref 96–112)
Creatinine, Ser: 0.79 mg/dL (ref 0.50–1.10)
GFR calc Af Amer: >90 mL/min (ref >90)
GFR calc non Af Amer: >90 mL/min (ref >90)
Glucose, Bld: 94 mg/dL (ref 70–99)
Potassium: 3.7 mEq/L (ref 3.7–5.3)
Sodium: 139 mEq/L (ref 137–147)
Total Bilirubin: 0.7 mg/dL (ref 0.3–1.2)
Total Protein: 7.9 g/dL (ref 6.0–8.3)

## 2013-08-12 LAB — ETHANOL: Alcohol, Ethyl (B): <11 mg/dL (ref 0–11)

## 2013-08-12 LAB — PREGNANCY, URINE: Preg Test, Ur: NEGATIVE

## 2013-08-12 MED ORDER — ALUM & MAG HYDROXIDE-SIMETH 200-200-20 MG/5ML PO SUSP: 30.0000 mL | ORAL | Status: DC | PRN

## 2013-08-12 MED ORDER — IBUPROFEN 200 MG PO TABS: 600.0000 mg | ORAL_TABLET | Freq: Three times a day (TID) | ORAL | Status: DC | PRN

## 2013-08-12 MED ORDER — FLUOXETINE HCL 10 MG PO CAPS: 10.0000 mg | ORAL_CAPSULE | Freq: Every day | ORAL | Status: DC

## 2013-08-12 MED ORDER — ACETAMINOPHEN 325 MG PO TABS: 650.0000 mg | ORAL_TABLET | ORAL | Status: DC | PRN

## 2013-08-12 MED ORDER — ONDANSETRON HCL 4 MG PO TABS: 4.0000 mg | ORAL_TABLET | Freq: Three times a day (TID) | ORAL | Status: DC | PRN

## 2013-08-12 NOTE — ED Notes (Signed)
Per pt, states she has been depressed on and off for awhile-recently had baby-wants to get on meds for depression and anxiety

## 2013-08-12 NOTE — Progress Notes (Signed)
   CARE MANAGEMENT ED NOTE 08/12/2013  Patient:  Andrea Burns,Andrea A   Account Number:  [id number]  Date Initiated:  08/12/2013  Documentation initiated by:  Edd ArbourGIBBS,Jaden Batchelder  Subjective/Objective Assessment:   28 yr old medicaid of [place] pt for postpartum depression     Subjective/Objective Assessment Detail:   CM consult for medicaid providers or self pay providers  Pt changed from county to county and does not know if has family planning medicaid  Scanned medicaid 8/14 card indicates no pcp     Action/Plan:   CM spoke with ED Sw, Pt Reviewed EPIC documents Spoke with pt [place] given a [place] of these providers and a list of self pay providers with outher resources   Action/Plan Detail:   Anticipated DC Date:  08/12/2013     Status Recommendation to Physician:   Result of Recommendation:    Other ED Services  Consult Working Plan   In-house referral  Clinical Social Worker   DC Associate Professorlanning Services  Other  Outpatient Services - Pt will follow up  CM consult  PCP issues    Choice offered to / List presented to:            Status of service:  Completed, signed off  ED Comments:   ED Comments Detail:  Please contact the Dept of Social services case worker to find a local medicaid accepting primary care doctor Referred to DSS  CM spoke with pt who [place]cp. CM discussed and provided written information for self pay pcps, importance of pcp for f/u care, www.needymeds.org, discounted pharmacies and other [place] resources such as financial assistance, DSS and  health department  Reviewed resources for [place] self [place] pcps like [facility], [facility] medicine at [address], [facility], general medical clinics, [facility] plus others, [facility] out patient pharmacies and housing Pt voiced understanding and appreciation of resources provided
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
## 2013-08-12 NOTE — Progress Notes (Signed)
Subjective:     Andrea Burns is a 28 y.o. female here for a follow up exam.  Current complaints: Pt states that her depression has gotten worse since last visit.  Pt states that she is trying to stay busy but depression is not getting any better. Pt depression score today is at 15, moderate severe depression. Pt states that she does sometimes have thoughts of hurting herself.  Pt states that she will at times cut her arms but not to point of bleeding.  Pt states that she has very low self esteem, feels bad about herself, that she isn't a good mother.  Pt states that her anxiety level gets high and she will be in pain.   Personal health questionnaire reviewed: yes.   Gynecologic History No LMP recorded.   Obstetric History OB History  Gravida Para Term Preterm AB SAB TAB Ectopic Multiple Living  4 4 4       4     # Outcome Date GA Lbr Len/2nd Weight Sex Delivery Anes PTL Lv  4 TRM 05/10/13 4711w3d 35:30 / 00:36 6 lb 15.3 oz (3.155 kg) M SVD Local  Y  3 TRM 04/23/09 3793w0d  8 lb (3.629 kg) M SVD None  Y     Comments: No complications  2 TRM 02/28/07 5793w0d  7 lb 5 oz (3.317 kg) M SVD None  Y     Comments: No complications  1 TRM 05/16/04 6875w0d  7 lb 11 oz (3.487 kg) F SVD None  Y     Comments: No complications       The following portions of the patient's history were reviewed and updated as appropriate: allergies, current medications, past family history, past medical history, past social history, past surgical history and problem list.  Review of Systems Pertinent items are noted in HPI.    Objective:    PE:  Deferred.    Assessment:    Postpartum Depression.   Plan:    Education reviewed: depression evaluation. [facility].

## 2013-08-12 NOTE — Discharge Instructions (Signed)
Depression, Adult °Depression is feeling sad, low, down in the dumps, blue, gloomy, or empty. In general, there are two kinds of depression: °· Normal sadness or grief. This can happen after something upsetting. It often goes away on its own within 2 weeks. After losing a loved one (bereavement), normal sadness and grief may last longer than two weeks. It usually gets better with time. °· Clinical depression. This kind lasts longer than normal sadness or grief. It keeps you from doing the things you normally do in life. It is often hard to function at home, work, or at school. It may affect your relationships with others. Treatment is often needed. °GET HELP RIGHT AWAY IF: °· You have thoughts about hurting yourself or others. °· You lose touch with reality (psychotic symptoms). You may: °· See or hear things that are not real. °· Have untrue beliefs about your life or people around you. °· Your medicine is giving you problems. °MAKE SURE YOU: °· Understand these instructions. °· Will watch your condition. °· Will get help right away if you are not doing well or get worse. °Document Released: 06/29/2010 Document Revised: 02/19/2012 Document Reviewed: 09/26/2011 °Exit[employer]® Patient Information ©2014 [employer]. ° °

## 2013-08-12 NOTE — Progress Notes (Signed)
CSW received referral for patient. TTS is aware of consult and will be assessing in line with other referrals.   Andrea HesselbachKristen Caryssa Elzey, LCSW [phone]  ED CSW 08/12/2013 1517pm

## 2013-08-12 NOTE — ED Provider Notes (Signed)
CSN: [id number]     Arrival date & time 08/12/13  1152 History   First MD Initiated Contact with Patient 08/12/13 1154     Chief Complaint  Patient presents with  . Medical Clearance     (Consider location/radiation/quality/duration/timing/severity/associated sxs/prior Treatment) HPI Patient reports she is 3 months postpartum. She has 4 children ranging from age 27 months up to 27 years old. She had her postnatal visit today with her OB/GYN to whom she related she's been feeling depressed. He sent her to the hospital to be evaluated. She states she doesn't feel good about herself. She states "I don't love myself". States she is depressed about gaining weight. States she had depression with her first pregnancy and  that child's father use to verbally ridicule her about her weight. She states she had a counselor after the first pregnancy and she started feeling better. She states she was at the point where they were going to start on medication however she felt better and didn't go. She also states she has "anxiety" in her legs and Dr. Clearance Coots has been giving her pain medications which don't help. She denies homicidal ideation, she denies suicidal ideation but states sometimes she feels like everyone would be better off if she wasn't around. She states she's never had to be admitted to a psychiatric hospital. She states she feels sad most of the time but not all the time. She states she does get tearful.  PCP none GYN Dr Clearance Coots  Past Medical History  Diagnosis Date  . Medical history non-contributory   . Depression   . Anxiety    Past Surgical History  Procedure Laterality Date  . No past surgeries    . Iud removal     Family History  Problem Relation Age of Onset  . Diabetes Mother   . Hypertension Mother   . Diabetes Father   . Cancer Paternal Grandmother     liver & lung   History  Substance Use Topics  . Smoking status: Former Smoker -- 0.25 packs/day for 1 years    Quit date:  05/03/2005  . Smokeless tobacco: Never Used  . Alcohol Use: Yes   Lives with 77 month old baby's father unemployed  OB History   Grav Para Term Preterm Abortions TAB SAB Ect Mult Living   4 4 4       4      Review of Systems  All other systems reviewed and are negative.      Allergies  Review of patient's allergies indicates no known allergies.  Home Medications   Current Outpatient Rx  Name  Route  Sig  Dispense  Refill  . Oxycodone HCl 10 MG TABS   Oral   Take 10 mg by mouth at bedtime as needed (anxiety).          BP 127/89  Pulse 82  Temp(Src) 98 F (36.7 C) (Oral)  Resp 17  SpO2 99%  Vital signs normal   Physical Exam  Nursing note and vitals reviewed. Constitutional: She is oriented to person, place, and time. She appears well-developed and well-nourished.  Non-toxic appearance. She does not appear ill. No distress.  HENT:  Head: Normocephalic and atraumatic.  Right Ear: External ear normal.  Left Ear: External ear normal.  Nose: Nose normal. No mucosal edema or rhinorrhea.  Mouth/Throat: Oropharynx is clear and moist and mucous membranes are normal. No dental abscesses or uvula swelling.  Eyes: Conjunctivae and EOM are normal. Pupils are equal,  round, and reactive to light.  Neck: Normal range of motion and full passive range of motion without pain. Neck supple.  Cardiovascular: Normal rate, regular rhythm and normal heart sounds.  Exam reveals no gallop and no friction rub.   No murmur heard. Pulmonary/Chest: Effort normal and breath sounds normal. No respiratory distress. She has no wheezes. She has no rhonchi. She has no rales. She exhibits no tenderness and no crepitus.  Abdominal: Soft. Normal appearance and bowel sounds are normal. She exhibits no distension. There is no tenderness. There is no rebound and no guarding.  Musculoskeletal: Normal range of motion. She exhibits no edema and no tenderness.  Moves all extremities well.   Neurological:  She is alert and oriented to person, place, and time. She has normal strength. No cranial nerve deficit.  Skin: Skin is warm, dry and intact. No rash noted. No erythema. No pallor.  Psychiatric: Her speech is normal and behavior is normal. Her mood appears not anxious.  Gets tearful    ED Course  Procedures (including critical care time)   14:05 Ava TSS discussed reason for consult   Labs Review Results for orders placed during the hospital encounter of 08/12/13  CBC WITH DIFFERENTIAL      Result Value Ref Range   WBC 9.6  4.0 - 10.5 K/uL   RBC 4.53  3.87 - 5.11 MIL/uL   Hemoglobin 13.4  12.0 - 15.0 g/dL   HCT 16.1  09.6 - 04.5 %   MCV 84.8  78.0 - 100.0 fL   MCH 29.6  26.0 - 34.0 pg   MCHC 34.9  30.0 - 36.0 g/dL   RDW 40.9  81.1 - 91.4 %   Platelets 285  150 - 400 K/uL   Neutrophils Relative % 72  43 - 77 %   Neutro Abs 6.9  1.7 - 7.7 K/uL   Lymphocytes Relative 22  12 - 46 %   Lymphs Abs 2.1  0.7 - 4.0 K/uL   Monocytes Relative 5  3 - 12 %   Monocytes Absolute 0.5  0.1 - 1.0 K/uL   Eosinophils Relative 1  0 - 5 %   Eosinophils Absolute 0.1  0.0 - 0.7 K/uL   Basophils Relative 0  0 - 1 %   Basophils Absolute 0.0  0.0 - 0.1 K/uL  COMPREHENSIVE METABOLIC PANEL      Result Value Ref Range   Sodium 139  137 - 147 mEq/L   Potassium 3.7  3.7 - 5.3 mEq/L   Chloride 100  96 - 112 mEq/L   CO2 23  19 - 32 mEq/L   Glucose, Bld 94  70 - 99 mg/dL   BUN 7  6 - 23 mg/dL   Creatinine, Ser 7.82  0.50 - 1.10 mg/dL   Calcium 9.7  8.4 - 95.6 mg/dL   Total Protein 7.9  6.0 - 8.3 g/dL   Albumin 4.6  3.5 - 5.2 g/dL   AST 44 (*) 0 - 37 U/L   ALT 51 (*) 0 - 35 U/L   Alkaline Phosphatase 59  39 - 117 U/L   Total Bilirubin 0.7  0.3 - 1.2 mg/dL   GFR calc non Af Amer >90  >90 mL/min   GFR calc Af Amer >90  >90 mL/min  ETHANOL      Result Value Ref Range   Alcohol, Ethyl (B) <11  0 - 11 mg/dL  URINALYSIS, ROUTINE W REFLEX MICROSCOPIC      Result  Value Ref Range   Color, Urine YELLOW   YELLOW   APPearance CLOUDY (*) CLEAR   Specific Gravity, Urine 1.025  1.005 - 1.030   pH 6.0  5.0 - 8.0   Glucose, UA NEGATIVE  NEGATIVE mg/dL   Hgb urine dipstick NEGATIVE  NEGATIVE   Bilirubin Urine NEGATIVE  NEGATIVE   Ketones, ur NEGATIVE  NEGATIVE mg/dL   Protein, ur NEGATIVE  NEGATIVE mg/dL   Urobilinogen, UA 1.0  0.0 - 1.0 mg/dL   Nitrite NEGATIVE  NEGATIVE   Leukocytes, UA MODERATE (*) NEGATIVE  URINE RAPID DRUG SCREEN (HOSP PERFORMED)      Result Value Ref Range   Opiates NONE DETECTED  NONE DETECTED   Cocaine NONE DETECTED  NONE DETECTED   Benzodiazepines NONE DETECTED  NONE DETECTED   Amphetamines NONE DETECTED  NONE DETECTED   Tetrahydrocannabinol NONE DETECTED  NONE DETECTED   Barbiturates NONE DETECTED  NONE DETECTED  PREGNANCY, URINE      Result Value Ref Range   Preg Test, Ur NEGATIVE  NEGATIVE  URINE MICROSCOPIC-ADD ON      Result Value Ref Range   Squamous Epithelial / LPF FEW (*) RARE   WBC, UA 7-10  <3 WBC/hpf   RBC / HPF 0-2  <3 RBC/hpf   Bacteria, UA MANY (*) RARE   Urine-Other MUCOUS PRESENT     Laboratory interpretation all normal except minor elevation of LFT's     Imaging Review No results found.   EKG Interpretation None      MDM   Final diagnoses:  Post partum depression   Disposition pending   Devoria AlbeIva Kelly Ranieri, MD, Armando GangFACEP      Ward GivensIva L Angles Trevizo, MD 08/12/13 1459

## 2013-08-12 NOTE — BH Assessment (Signed)
[facility] Assessment Progress Note  At the request of Assunta FoundShuvon Rankin, FNP this writer called [facility] to schedule appointments for this pt.  At 16:00 [facility] an intake appointment for Friday, 08/13/2013 at 1:00 pm.  Her first appointment for medication management will be Monday, 08/23/2013 at 2:00 pm.  Doylene Canninghomas Hughes, MA Triage Specialist 08/12/2013 @ 16:21

## 2013-08-12 NOTE — ED Notes (Signed)
Pt wants evaluation for depression medications.  No SI

## 2013-08-17 NOTE — Consult Note (Signed)
Face to face evaluation and I agree with this note 

## 2013-09-29 IMAGING — CR DG CHEST 2V
2 series · 2 of 2 positions shown · non-contrast
Comparison: None.

CLINICAL DATA: 23 weeks pregnant, coughing

CHEST - 2 VIEW

[view not recorded (1 of 2)]
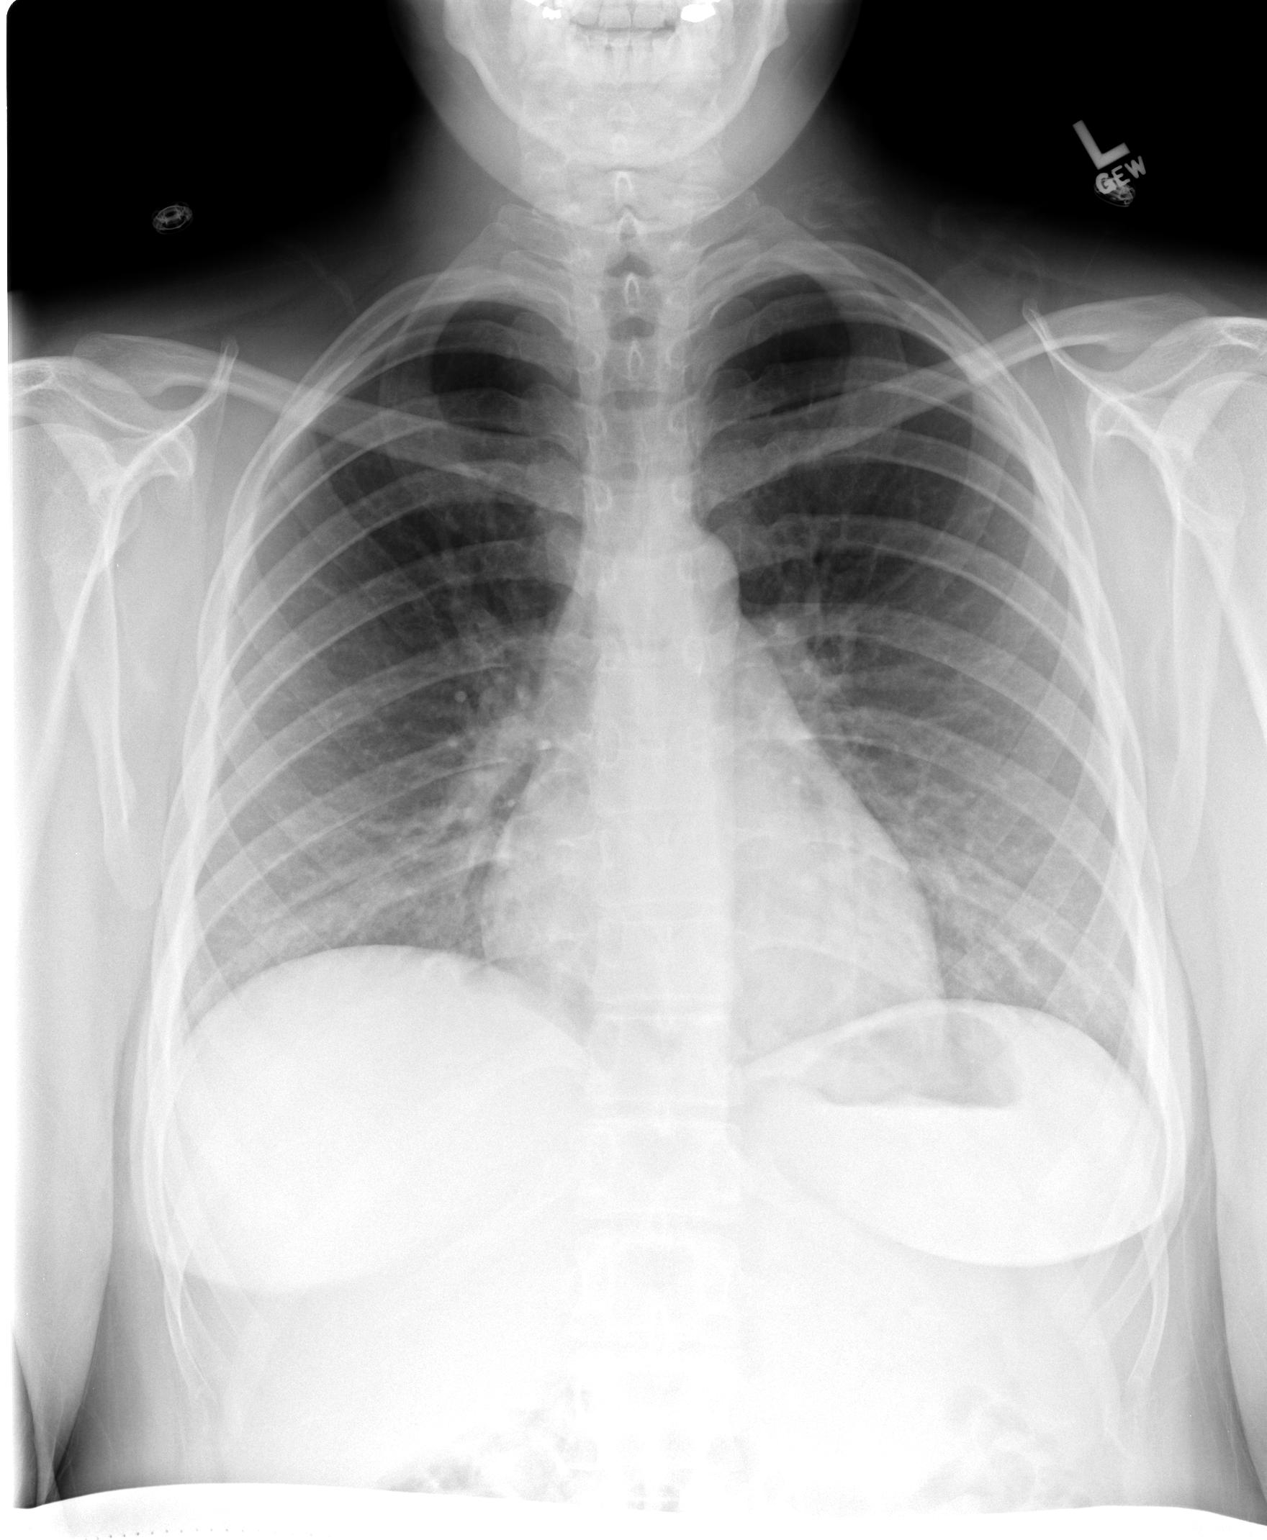

[view not recorded (2 of 2)]
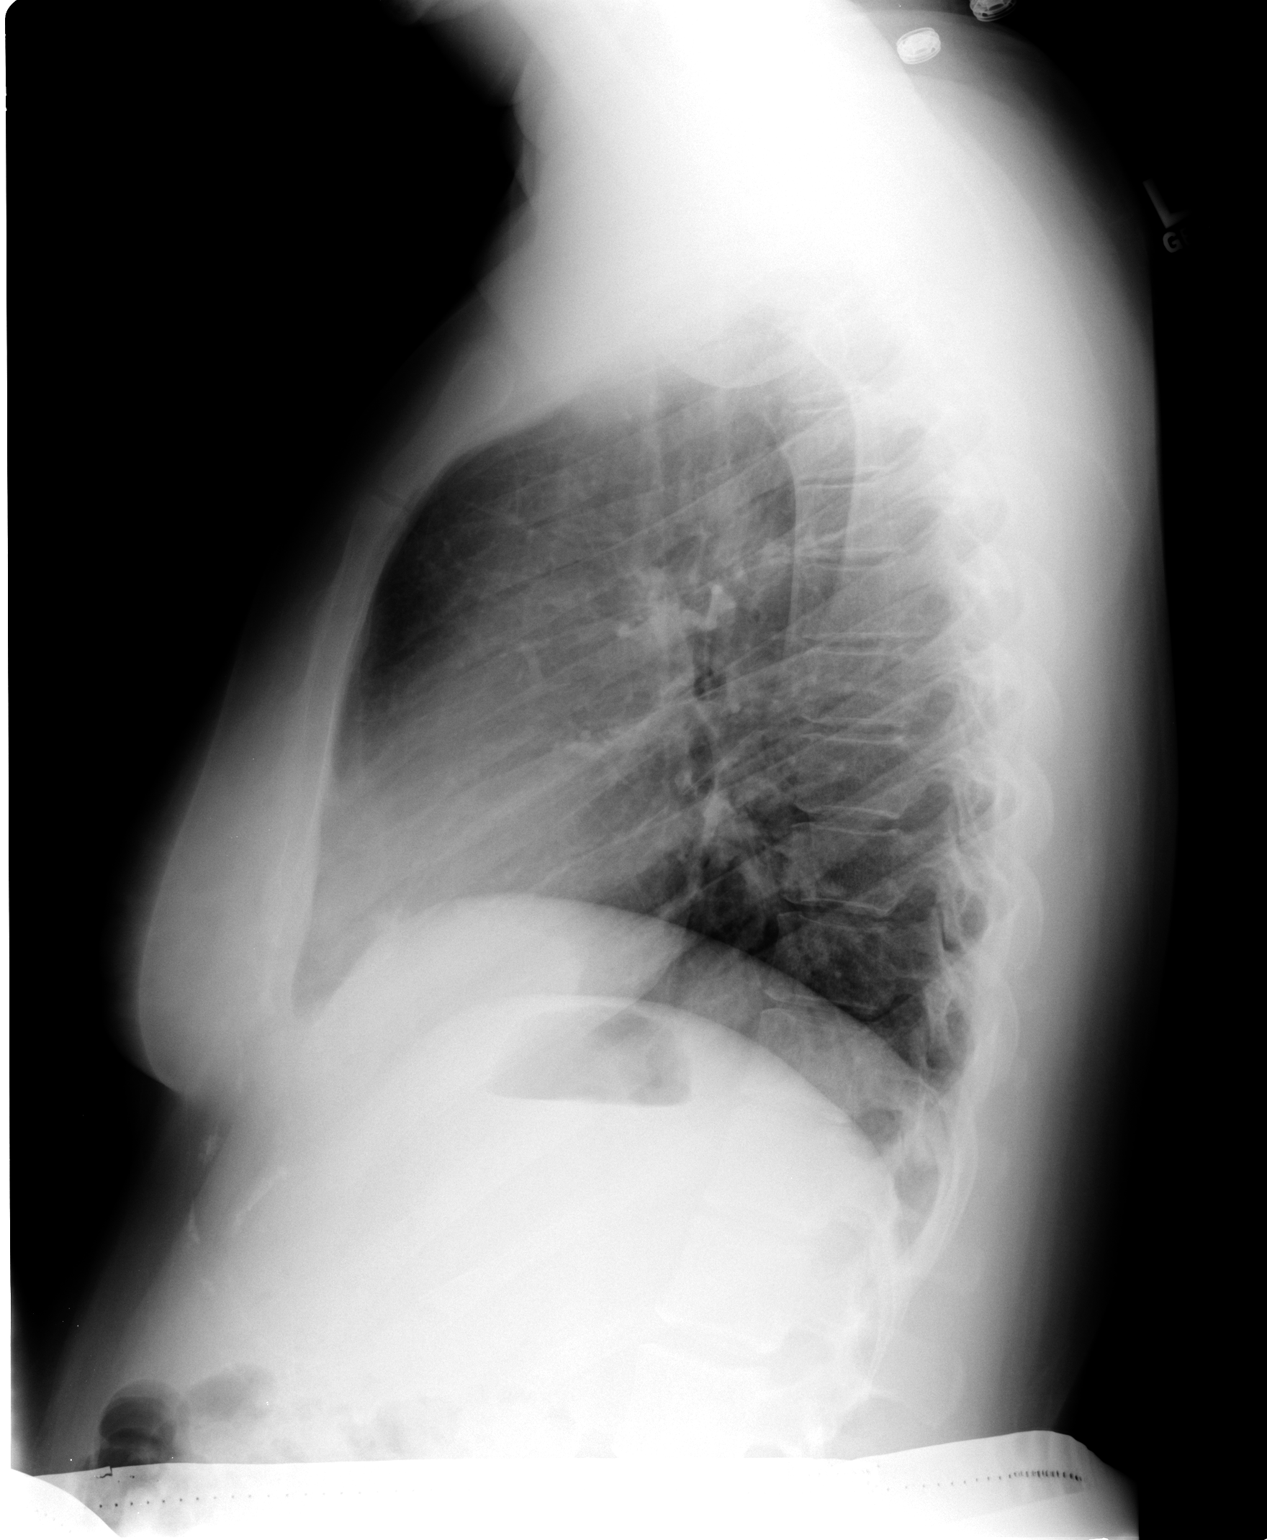

[2 of 2 positions shown; findings below may reference images not displayed]

FINDINGS: Normal mediastinum and heart silhouette.  No evidence
effusion, infiltrate, or pneumothorax. Mild vascular congestion.
IMPRESSION: Mild vascular congestion.  No evidence of pneumonia.

## 2013-11-08 ENCOUNTER — Ambulatory Visit (INDEPENDENT_AMBULATORY_CARE_PROVIDER_SITE_OTHER): Payer: Medicaid Other | Admitting: Obstetrics

## 2013-11-08 ENCOUNTER — Encounter: Payer: Self-pay | Admitting: Obstetrics

## 2013-11-08 VITALS — BP 105/71 | HR 66 | Temp 97.7°F | Ht 62.0 in | Wt 163.0 lb

## 2013-11-08 DIAGNOSIS — K529 Noninfective gastroenteritis and colitis, unspecified: Secondary | ICD-10-CM

## 2013-11-08 DIAGNOSIS — N939 Abnormal uterine and vaginal bleeding, unspecified: Secondary | ICD-10-CM

## 2013-11-08 DIAGNOSIS — G43109 Migraine with aura, not intractable, without status migrainosus: Secondary | ICD-10-CM

## 2013-11-08 DIAGNOSIS — N926 Irregular menstruation, unspecified: Secondary | ICD-10-CM

## 2013-11-08 DIAGNOSIS — N390 Urinary tract infection, site not specified: Secondary | ICD-10-CM | POA: Insufficient documentation

## 2013-11-08 DIAGNOSIS — K5289 Other specified noninfective gastroenteritis and colitis: Secondary | ICD-10-CM

## 2013-11-08 LAB — POCT URINALYSIS DIPSTICK
Blood, UA: NEGATIVE
Glucose, UA: NEGATIVE
Ketones, UA: NEGATIVE
Nitrite, UA: NEGATIVE
Protein, UA: NEGATIVE
Spec Grav, UA: 1.01
pH, UA: 7.5

## 2013-11-08 LAB — POCT URINE PREGNANCY: Preg Test, Ur: NEGATIVE

## 2013-11-08 NOTE — Progress Notes (Signed)
Patient ID: Andrea Burns, female   DOB: 01-31-1986, 28 y.o.   MRN: [id number]  Chief Complaint  Patient presents with  . Problem    UTI    HPI Andrea Burns is a 28 y.o. female.  H/O UTI, treated.  Then developed N/V and diarrhea after taking antibiotic.  Also treated for vaginal BV and yeast infections.  HPI  Past Medical History  Diagnosis Date  . Medical history non-contributory   . Depression   . Anxiety     Past Surgical History  Procedure Laterality Date  . No past surgeries    . Iud removal      Family History  Problem Relation Age of Onset  . Diabetes Mother   . Hypertension Mother   . Diabetes Father   . Cancer Paternal Grandmother     liver & lung    Social History History  Substance Use Topics  . Smoking status: Former Smoker -- 0.25 packs/day for 1 years    Quit date: 05/03/2005  . Smokeless tobacco: Never Used  . Alcohol Use: Yes    No Known Allergies  Current Outpatient Prescriptions  Medication Sig Dispense Refill  . FLUoxetine (PROZAC) 10 MG capsule Take 1 capsule (10 mg total) by mouth daily.  30 capsule  0   Current Facility-Administered Medications  Medication Dose Route Frequency Provider Last Rate Last Dose  . etonogestrel (IMPLANON) implant 68 mg  68 mg Subcutaneous Once Amy Dessa Phi, CNM        Review of Systems Review of Systems Constitutional: negative for fatigue and weight loss Respiratory: negative for cough and wheezing Cardiovascular: negative for chest pain, fatigue and palpitations Gastrointestinal: negative for abdominal pain and change in bowel habits Genitourinary:negative Integument/breast: negative for nipple discharge Musculoskeletal:negative for myalgias Neurological: negative for gait problems and tremors Behavioral/Psych: negative for abusive relationship, depression Endocrine: negative for temperature intolerance     Blood pressure 105/71, pulse 66, temperature 97.7 F (36.5 C), height 5\' 2"  (1.575  m), weight 163 lb (73.936 kg), not currently breastfeeding.  Physical Exam Physical Exam General:   alert  Skin:   no rash or abnormalities  Lungs:   clear to auscultation bilaterally  Heart:   regular rate and rhythm, S1, S2 normal, no murmur, click, rub or gallop  Breasts:   normal without suspicious masses, skin or nipple changes or axillary nodes  Abdomen:  normal findings: no organomegaly, soft, non-tender and no hernia  Pelvis:  External genitalia: normal general appearance Urinary system: urethral meatus normal and bladder without fullness, nontender Vaginal: normal without tenderness, induration or masses Cervix: normal appearance Adnexa: normal bimanual exam Uterus: anteverted and non-tender, normal size      Data Reviewed Labs  Assessment    Gastroenteritis after antibiotic Rx.  Stable.     Plan    Bowel rest with clear liquids for 24 hours, then BRATT like diet for a few days.  Orders Placed This Encounter  Procedures  . Urine culture  . WET PREP BY MOLECULAR PROBE  . GC/Chlamydia Probe Amp  . Ambulatory referral to Neurology    Referral Priority:  Routine    Referral Type:  Consultation    Referral Reason:  Specialty Services Required    Requested Specialty:  Neurology    Number of Visits Requested:  1  . POCT urine pregnancy  . POCT urinalysis dipstick   No orders of the defined types were placed in this encounter.  Brock Badharles A Harper 11/08/2013, 4:01 PM

## 2013-11-09 LAB — WET PREP BY MOLECULAR PROBE
Candida species: NEGATIVE
Gardnerella vaginalis: POSITIVE — AB
Trichomonas vaginosis: NEGATIVE

## 2013-11-09 LAB — GC/CHLAMYDIA PROBE AMP
CT Probe RNA: NEGATIVE
GC Probe RNA: NEGATIVE

## 2013-11-09 LAB — URINE CULTURE
Colony Count: NO GROWTH
Organism ID, Bacteria: NO GROWTH

## 2013-11-12 ENCOUNTER — Encounter: Payer: Self-pay | Admitting: *Deleted

## 2013-11-12 ENCOUNTER — Other Ambulatory Visit: Payer: Self-pay | Admitting: *Deleted

## 2013-11-12 DIAGNOSIS — N760 Acute vaginitis: Principal | ICD-10-CM

## 2013-11-12 DIAGNOSIS — B9689 Other specified bacterial agents as the cause of diseases classified elsewhere: Secondary | ICD-10-CM

## 2013-11-12 MED ORDER — METRONIDAZOLE 500 MG PO TABS: 500.0000 mg | ORAL_TABLET | Freq: Two times a day (BID) | ORAL | Status: DC

## 2013-11-15 ENCOUNTER — Ambulatory Visit: Payer: Medicaid Other | Admitting: Neurology

## 2013-12-24 ENCOUNTER — Encounter: Payer: Self-pay | Admitting: Neurology

## 2013-12-24 ENCOUNTER — Ambulatory Visit (INDEPENDENT_AMBULATORY_CARE_PROVIDER_SITE_OTHER): Payer: Medicaid Other | Admitting: Neurology

## 2013-12-24 VITALS — BP 110/79 | HR 64 | Ht 62.0 in | Wt 165.0 lb

## 2013-12-24 DIAGNOSIS — G2581 Restless legs syndrome: Secondary | ICD-10-CM

## 2013-12-24 DIAGNOSIS — R519 Headache, unspecified: Secondary | ICD-10-CM | POA: Insufficient documentation

## 2013-12-24 DIAGNOSIS — R51 Headache: Secondary | ICD-10-CM

## 2013-12-24 DIAGNOSIS — G43119 Migraine with aura, intractable, without status migrainosus: Secondary | ICD-10-CM

## 2013-12-24 MED ORDER — RIZATRIPTAN BENZOATE 5 MG PO TBDP: 5.0000 mg | ORAL_TABLET | ORAL | Status: DC | PRN

## 2013-12-24 MED ORDER — TOPIRAMATE 100 MG PO TABS: 100.0000 mg | ORAL_TABLET | Freq: Two times a day (BID) | ORAL | Status: DC

## 2013-12-24 NOTE — Progress Notes (Signed)
PATIENT: Andrea Burns Andrea Burns DOB: 05/31/1986  HISTORICAL  Andrea Burns is Andrea 28 years old right-handed Hispanic female, referred by her primary care Dr. Clearance CootsHarper for evaluation of migraine headaches, left facial pulling sensation.  She reported Andrea history of headaches since 2006, similar kind, getting worse, stronger since 2013, she complains of left retrorbital area severe constant pressure pain, no light noise sensitivity, no nause, generalized weakness, lasting for one hour, she prefers to lie down in dark quiet room.   She has headache daily since 2013, lasting half of the day. She has been taken topamax 50mg  bid,x 3 months,  did help her some, no significant side effect.  She has been taking Fioricet as needed, which has helpful, recent few months, she also has intermittent left facial twitching,  She has tried over-the-counter Tylenol, ibuprofen without helping, she complains of bilateral lower extremity deep achy pain, urge to move at nighttime, is taking Requip, which has been helpful. She is also taking gabapentin 300 mg every night  Recent laboratory evaluation showed Andrea hemoglobin of 12 in March 2015, there was no iron panel documented, she has 4 children, the youngest is now 7 months, she stopped nursing,  REVIEW OF SYSTEMS: Full 14 system review of systems performed and notable only for as above.  ALLERGIES: No Known Allergies  HOME MEDICATIONS: Current Outpatient Prescriptions on File Prior to Visit  Medication Sig Dispense Refill  . FLUoxetine (PROZAC) 10 MG capsule Take 1 capsule (10 mg total) by mouth daily.  30 capsule  0  . metroNIDAZOLE (FLAGYL) 500 MG tablet Take 1 tablet (500 mg total) by mouth 2 (two) times daily.  14 tablet  0     PAST MEDICAL HISTORY: Past Medical History  Diagnosis Date  . Medical history non-contributory   . Depression   . Anxiety   . HA (headache)     PAST SURGICAL HISTORY: Past Surgical History  Procedure Laterality Date  . No  past surgeries    . Iud removal      FAMILY HISTORY: Family History  Problem Relation Age of Onset  . Diabetes Mother   . Hypertension Mother   . Diabetes Father   . Cancer Paternal Grandmother     liver & lung    SOCIAL HISTORY:  History   Social History  . Marital Status: Married    Spouse Name: N/Andrea    Number of Children: 4  . Years of Education: N/Andrea   Occupational History  . Not working   Social History Main Topics  . Smoking status: Former Smoker -- 0.25 packs/day for 1 years    Quit date: 05/03/2005  . Smokeless tobacco: Never Used  . Alcohol Use: Yes  . Drug Use: No  . Sexual Activity: Yes    Partners: Male    Birth Control/ Protection: Implant   Other Topics Concern  . Not on file   Social History Narrative  . No narrative on file   PHYSICAL EXAM   Filed Vitals:   12/24/13 1102  BP: 110/79  Pulse: 64  Height: 5\' 2"  (1.575 m)  Weight: 165 lb (74.844 kg)    Not recorded    Body mass index is 30.17 kg/(m^2).   Generalized: In no acute distress  Neck: Supple, no carotid bruits   Cardiac: Regular rate rhythm  Pulmonary: Clear to auscultation bilaterally  Musculoskeletal: No deformity  Neurological examination  Mentation: Alert oriented to time, place, history taking, and causual conversation  Cranial  nerve II-XII: Pupils were equal round reactive to light. Extraocular movements were full.  Visual field were full on confrontational test. Bilateral fundi were sharp.  Facial sensation and strength were normal. Hearing was intact to finger rubbing bilaterally. Uvula tongue midline.  Head turning and shoulder shrug and were normal and symmetric.Tongue protrusion into cheek strength was normal.  Motor: Normal tone, bulk and strength.  Sensory: Intact to fine touch, pinprick, preserved vibratory sensation, and proprioception at toes.  Coordination: Normal finger to nose, heel-to-shin bilaterally there was no truncal ataxia  Gait: Rising up  from seated position without assistance, normal stance, without trunk ataxia, moderate stride, good arm swing, smooth turning, able to perform tiptoe, and heel walking without difficulty.   Romberg signs: Negative  Deep tendon reflexes: Brachioradialis 2/2, biceps 2/2, triceps 2/2, patellar 2/2, Achilles 2/2, plantar responses were flexor bilaterally.   DIAGNOSTIC DATA (LABS, IMAGING, TESTING) - I reviewed patient records, labs, notes, testing and imaging myself where available.  Lab Results  Component Value Date   WBC 9.6 08/12/2013   HGB 13.4 08/12/2013   HCT 38.4 08/12/2013   MCV 84.8 08/12/2013   PLT 285 08/12/2013      Component Value Date/Time   NA 139 08/12/2013 1235   K 3.7 08/12/2013 1235   CL 100 08/12/2013 1235   CO2 23 08/12/2013 1235   GLUCOSE 94 08/12/2013 1235   BUN 7 08/12/2013 1235   CREATININE 0.79 08/12/2013 1235   CALCIUM 9.7 08/12/2013 1235   PROT 7.9 08/12/2013 1235   ALBUMIN 4.6 08/12/2013 1235   AST 44* 08/12/2013 1235   ALT 51* 08/12/2013 1235   ALKPHOS 59 08/12/2013 1235   BILITOT 0.7 08/12/2013 1235   GFRNONAA >90 08/12/2013 1235   GFRAA >90 08/12/2013 1235   ASSESSMENT AND PLAN  Andrea Burns is Andrea 28 y.o. female complains of frequent migraines, involving left side, normal neurological examinations,  1, increased Topamax to 100 mg twice Andrea day Maxalt as needed 2, she also has symptoms suggestive of restless leg syndrome, will check iron panel, most recent hemoglobin was on the low side 12,  3. Return to clinic with Eber Jones in 2-3 months,     Levert Feinstein, M.D. Ph.D.  [facility] [address] [place], [place] [address] [phone]

## 2013-12-25 LAB — IRON AND TIBC
Iron Saturation: 38 % (ref 15–55)
Iron: 115 ug/dL (ref 35–155)
TIBC: 303 ug/dL (ref 250–450)
UIBC: 188 ug/dL (ref 150–375)

## 2013-12-25 LAB — FERRITIN: Ferritin: 26 ng/mL (ref 15–150)

## 2013-12-25 LAB — TSH: TSH: 1.64 u[IU]/mL (ref 0.450–4.500)

## 2013-12-25 LAB — VITAMIN B12: Vitamin B-12: 576 pg/mL (ref 211–946)

## 2013-12-29 ENCOUNTER — Encounter (HOSPITAL_COMMUNITY): Payer: Self-pay | Admitting: Emergency Medicine

## 2013-12-29 ENCOUNTER — Emergency Department (HOSPITAL_COMMUNITY)
Admission: EM | Admit: 2013-12-29 | Discharge: 2013-12-29 | Disposition: A | Discharge status: Home / Self Care | Payer: Medicaid Other | Source: Home / Self Care | Attending: Family Medicine | Admitting: Family Medicine

## 2013-12-29 DIAGNOSIS — S81809A Unspecified open wound, unspecified lower leg, initial encounter: Secondary | ICD-10-CM

## 2013-12-29 DIAGNOSIS — S81812A Laceration without foreign body, left lower leg, initial encounter: Secondary | ICD-10-CM

## 2013-12-29 DIAGNOSIS — S91009A Unspecified open wound, unspecified ankle, initial encounter: Secondary | ICD-10-CM

## 2013-12-29 DIAGNOSIS — S81009A Unspecified open wound, unspecified knee, initial encounter: Secondary | ICD-10-CM

## 2013-12-29 DIAGNOSIS — W2209XA Striking against other stationary object, initial encounter: Secondary | ICD-10-CM

## 2013-12-29 NOTE — ED Notes (Signed)
Last tetanus <5 yr ago. Wound cleaned , antibacterial oint and non adherent dressing applied to sutured wounds. [facility], sooner if problems (pus, fever, red streaks)

## 2013-12-29 NOTE — ED Provider Notes (Signed)
CSN: [id number]     Arrival date & time 12/29/13  1632 History   First MD Initiated Contact with Patient 12/29/13 1741     Chief Complaint  Patient presents with  . Extremity Laceration   (Consider location/radiation/quality/duration/timing/severity/associated sxs/prior Treatment) HPI Comments: 28 year old female presents for evaluation of all the lacerations to her left leg. She was walking when she walked into a metal post and cut her leg. This happened about 30 minutes prior to arrival. Bleeding has been controlled with direct pressure. She is not in a significant amount of pain at this time. Tetanus is up-to-date. No other injuries or systemic symptoms. No numbness in the foot   Past Medical History  Diagnosis Date  . Medical history non-contributory   . Depression   . Anxiety   . HA (headache)    Past Surgical History  Procedure Laterality Date  . No past surgeries    . Iud removal     Family History  Problem Relation Age of Onset  . Diabetes Mother   . Hypertension Mother   . Diabetes Father   . Cancer Paternal Grandmother     liver & lung   History  Substance Use Topics  . Smoking status: Former Smoker -- 0.25 packs/day for 1 years    Quit date: 05/03/2005  . Smokeless tobacco: Never Used  . Alcohol Use: Yes   OB History   Grav Para Term Preterm Abortions TAB SAB Ect Mult Living   4 4 4       4      Review of Systems  Skin: Positive for wound.  All other systems reviewed and are negative.   Allergies  Review of patient's allergies indicates no known allergies.  Home Medications   Prior to Admission medications   Medication Sig Start Date End Date Taking? Authorizing Provider  FLUoxetine (PROZAC) 10 MG capsule Take 1 capsule (10 mg total) by mouth daily. 08/12/13   Shuvon Rankin, NP  gabapentin (NEURONTIN) 300 MG capsule Take 300 mg by mouth at bedtime.    Historical Provider, MD  metroNIDAZOLE (FLAGYL) 500 MG tablet Take 1 tablet (500 mg total) by mouth 2  (two) times daily. 11/12/13   Brock Bad, MD  rizatriptan (MAXALT-MLT) 5 MG disintegrating tablet Take 1 tablet (5 mg total) by mouth as needed for migraine. May repeat in 2 hours if needed 12/24/13   Levert Feinstein, MD  rOPINIRole (REQUIP) 2 MG tablet Take 2 mg by mouth at bedtime.    Historical Provider, MD  topiramate (TOPAMAX) 100 MG tablet Take 1 tablet (100 mg total) by mouth 2 (two) times daily. 12/24/13   Levert Feinstein, MD   BP 132/85  Pulse 70  Temp(Src) 98.2 F (36.8 C) (Oral)  Resp 14  SpO2 100% Physical Exam  Nursing note and vitals reviewed. Constitutional: She is oriented to person, place, and time. Vital signs are normal. She appears well-developed and well-nourished. No distress.  HENT:  Head: Normocephalic and atraumatic.  Cardiovascular:  Pulses:      Dorsalis pedis pulses are 2+ on the left side.  Pulmonary/Chest: Effort normal. No respiratory distress.  Musculoskeletal:       Left lower leg: She exhibits laceration.       Legs: Neurological: She is alert and oriented to person, place, and time. She has normal strength. Coordination normal.  Skin: Skin is warm and dry. No rash noted. She is not diaphoretic.  Psychiatric: She has a normal mood and affect. Judgment normal.  ED Course  LACERATION REPAIR Date/Time: 12/29/2013 6:43 PM Performed by: Autumn MessingBAKER, Evely Gainey, H Authorized by: Clementeen GrahamOREY, EVAN, S Consent: Verbal consent obtained. Risks and benefits: risks, benefits and alternatives were discussed Consent given by: patient Patient understanding: patient states understanding of the procedure being performed Patient identity confirmed: verbally with patient Time out: Immediately prior to procedure a "time out" was called to verify the correct patient, procedure, equipment, support staff and site/side marked as required. Body area: lower extremity Location details: left lower leg Laceration length: 5 cm Foreign bodies: no foreign bodies Tendon involvement: none Nerve  involvement: none Vascular damage: no Anesthesia: local infiltration Local anesthetic: lidocaine 2% with epinephrine Anesthetic total: 8 ml Preparation: Patient was prepped and draped in the usual sterile fashion. Irrigation solution: saline Irrigation method: syringe Amount of cleaning: extensive Debridement: none Degree of undermining: none Skin closure: 4-0 Prolene Number of sutures: 8 Technique: simple Approximation: close Approximation difficulty: complex Dressing: 4x4 sterile gauze and antibiotic ointment Patient tolerance: Patient tolerated the procedure well with no immediate complications.  LACERATION REPAIR Date/Time: 12/29/2013 6:45 PM Performed by: Autumn MessingBAKER, Tameyah Koch, H Authorized by: Clementeen GrahamOREY, EVAN, S Consent: Verbal consent obtained. Risks and benefits: risks, benefits and alternatives were discussed Consent given by: patient Patient understanding: patient states understanding of the procedure being performed Patient identity confirmed: verbally with patient Time out: Immediately prior to procedure a "time out" was called to verify the correct patient, procedure, equipment, support staff and site/side marked as required. Body area: lower extremity Location details: left lower leg Laceration length: 5 cm Foreign bodies: no foreign bodies Tendon involvement: none Nerve involvement: none Vascular damage: no Anesthesia: local infiltration Local anesthetic: lidocaine 2% with epinephrine Anesthetic total: 10 ml Preparation: Patient was prepped and draped in the usual sterile fashion. Irrigation solution: saline Irrigation method: syringe Amount of cleaning: extensive Debridement: none Degree of undermining: none Skin closure: 4-0 Prolene Number of sutures: 3 Technique: vertical mattress and simple (1 simple interrupted and 2 vertical mattress suture ) Approximation: close Approximation difficulty: complex Patient tolerance: Patient tolerated the procedure well with  no immediate complications.   (including critical care time) Labs Review Labs Reviewed - No data to display  Imaging Review No results found.   MDM   1. Lacerations of multiple sites of leg, left, initial encounter    Both lacerations were anesthetized and then scrubbed with chlorhexidine, copiously irrigated with 1 L of sterile water. Sterile field over both lacerations prepped with Betadine scrub and draped. The top laceration was repaired with 8 simple interrupted sutures, and the bottom with 2 vertical mattress and one simple interrupted. Keep off the foot and keep it elevated for a couple of days, watch for signs of infection. Followup in 10 days for suture removal       Graylon GoodZachary H Vaishnav Demartin, PA-C 12/29/13 2149

## 2013-12-29 NOTE — ED Notes (Signed)
States she did not see metal post that she walked into just PTA. Laceration to left lower leg and to dorsum of left foot ; bleeding controlled at present

## 2013-12-29 NOTE — Discharge Instructions (Signed)

## 2014-01-01 NOTE — ED Provider Notes (Signed)
Medical screening examination/treatment/procedure(s) were performed by resident physician or non-physician practitioner and as supervising physician I was immediately available for consultation/collaboration.   Latavion Halls DOUGLAS MD.   Senica Crall D Kimiyah Blick, MD 01/01/14 1104 

## 2014-01-03 ENCOUNTER — Ambulatory Visit: Payer: Medicaid Other | Admitting: Obstetrics

## 2014-01-04 ENCOUNTER — Telehealth: Payer: Self-pay | Admitting: *Deleted

## 2014-01-04 NOTE — Telephone Encounter (Signed)
Informed patient labs within normal level. Patient verbalizes understanding.

## 2014-01-07 ENCOUNTER — Encounter (HOSPITAL_COMMUNITY): Payer: Self-pay | Admitting: Emergency Medicine

## 2014-01-07 ENCOUNTER — Emergency Department (INDEPENDENT_AMBULATORY_CARE_PROVIDER_SITE_OTHER)
Admission: EM | Admit: 2014-01-07 | Discharge: 2014-01-07 | Disposition: A | Discharge status: Home / Self Care | Payer: Medicaid Other | Source: Home / Self Care | Attending: Family Medicine | Admitting: Family Medicine

## 2014-01-07 DIAGNOSIS — Z4802 Encounter for removal of sutures: Secondary | ICD-10-CM

## 2014-01-07 NOTE — ED Provider Notes (Signed)
Medical screening examination/treatment/procedure(s) were performed by resident physician or non-physician practitioner and as supervising physician I was immediately available for consultation/collaboration.   Barkley BrunsKINDL,Garek Schuneman DOUGLAS MD.   Linna HoffJames D Philomena Buttermore, MD 01/07/14 [phone]

## 2014-01-07 NOTE — ED Provider Notes (Signed)
CSN: [id number]     Arrival date & time 01/07/14  7829 History   First MD Initiated Contact with Patient 01/07/14 [phone]     Chief Complaint  Patient presents with  . Suture / Staple Removal   (Consider location/radiation/quality/duration/timing/severity/associated sxs/prior Treatment) HPI Comments: Suffered two lacerations to left lower leg on 12/29/2013. Repaired using sutures on 12/29/2013 and patient is here today for suture removal. Denies any difficulties with wound since repair.   Patient is a 28 y.o. female presenting with suture removal. The history is provided by the patient.  Suture / Staple Removal This is a new problem.    Past Medical History  Diagnosis Date  . Medical history non-contributory   . Depression   . Anxiety   . HA (headache)    Past Surgical History  Procedure Laterality Date  . No past surgeries    . Iud removal     Family History  Problem Relation Age of Onset  . Diabetes Mother   . Hypertension Mother   . Diabetes Father   . Cancer Paternal Grandmother     liver & lung   History  Substance Use Topics  . Smoking status: Former Smoker -- 0.25 packs/day for 1 years    Quit date: 05/03/2005  . Smokeless tobacco: Never Used  . Alcohol Use: Yes   OB History   Grav Para Term Preterm Abortions TAB SAB Ect Mult Living   4 4 4       4      Review of Systems  All other systems reviewed and are negative.   Allergies  Review of patient's allergies indicates no known allergies.  Home Medications   Prior to Admission medications   Medication Sig Start Date End Date Taking? Authorizing Provider  FLUoxetine (PROZAC) 10 MG capsule Take 1 capsule (10 mg total) by mouth daily. 08/12/13   Shuvon Rankin, NP  gabapentin (NEURONTIN) 300 MG capsule Take 300 mg by mouth at bedtime.    Historical Provider, MD  metroNIDAZOLE (FLAGYL) 500 MG tablet Take 1 tablet (500 mg total) by mouth 2 (two) times daily. 11/12/13   Brock Bad, MD  rizatriptan (MAXALT-MLT)  5 MG disintegrating tablet Take 1 tablet (5 mg total) by mouth as needed for migraine. May repeat in 2 hours if needed 12/24/13   Levert Feinstein, MD  rOPINIRole (REQUIP) 2 MG tablet Take 2 mg by mouth at bedtime.    Historical Provider, MD  topiramate (TOPAMAX) 100 MG tablet Take 1 tablet (100 mg total) by mouth 2 (two) times daily. 12/24/13   Levert Feinstein, MD   BP 118/74  Pulse 65  Temp(Src) 98.2 F (36.8 C) (Oral)  Resp 15  SpO2 100% Physical Exam  Nursing note and vitals reviewed. Constitutional: She is oriented to person, place, and time. She appears well-developed and well-nourished. No distress.  HENT:  Head: Normocephalic and atraumatic.  Cardiovascular: Normal rate.   Pulmonary/Chest: Effort normal.  Neurological: She is alert and oriented to person, place, and time.  Skin: Skin is warm and dry. No rash noted. No erythema.  Two healing linear lacerations at left lower leg. No clinical signs of infection or dehiscence.     Psychiatric: She has a normal mood and affect. Her behavior is normal.    ED Course  SUTURE REMOVAL Date/Time: 01/07/2014 10:27 AM Performed by: Lemmie Evens LEE Authorized by: Bradd Canary D Consent: Verbal consent obtained. Consent given by: patient Patient identity confirmed: verbally with patient Time out: Immediately  prior to procedure a "time out" was called to verify the correct patient, procedure, equipment, support staff and site/side marked as required. Body area: lower extremity Location details: left ankle Wound Appearance: clean Sutures Removed: 3 (and 8 from additional left lower leg wound) Post-removal: Steri-Strips applied Facility: sutures placed in this facility Patient tolerance: Patient tolerated the procedure well with no immediate complications.   (including critical care time) Labs Review Labs Reviewed - No data to display  Imaging Review No results found.   MDM   1. Encounter for removal of sutures    Wound care  instructions provided. follow up prn.    Ardis RowanJennifer Lee Levonia Wolfley, PA 01/07/14 1029

## 2014-01-07 NOTE — Discharge Instructions (Signed)

## 2014-01-07 NOTE — ED Notes (Signed)
4 children with pt

## 2014-01-07 NOTE — ED Notes (Signed)
Suture removal from left lower leg/ankle.  Sutures placed 7/22

## 2014-02-28 ENCOUNTER — Encounter (HOSPITAL_COMMUNITY): Payer: Self-pay | Admitting: Emergency Medicine

## 2014-02-28 ENCOUNTER — Emergency Department (HOSPITAL_COMMUNITY)
Admission: EM | Admit: 2014-02-28 | Discharge: 2014-02-28 | Disposition: A | Discharge status: Home / Self Care | Payer: Medicaid Other | Source: Home / Self Care | Attending: Family Medicine | Admitting: Family Medicine

## 2014-02-28 DIAGNOSIS — J040 Acute laryngitis: Secondary | ICD-10-CM

## 2014-02-28 DIAGNOSIS — J069 Acute upper respiratory infection, unspecified: Secondary | ICD-10-CM

## 2014-02-28 LAB — POCT RAPID STREP A: Streptococcus, Group A Screen (Direct): NEGATIVE

## 2014-02-28 MED ORDER — BENZONATATE 100 MG PO CAPS: 100.0000 mg | ORAL_CAPSULE | Freq: Three times a day (TID) | ORAL | Status: DC | PRN

## 2014-02-28 NOTE — Discharge Instructions (Signed)
Rapid strep test negative. Plenty of fluids and rest. Tylenol or ibuprofen as directed on packaging for discomfort. Warm salt water gargles for throat. Expect improvement over the next 4-5 days.   Laryngitis At the top of your windpipe is your voice box. It is the source of your voice. Inside your voice box are 2 bands of muscles called vocal cords. When you breathe, your vocal cords are relaxed and open so that air can get into the lungs. When you decide to say something, these cords come together and vibrate. The sound from these vibrations goes into your throat and comes out through your mouth as sound. Laryngitis is an inflammation of the vocal cords that causes hoarseness, cough, loss of voice, sore throat, and dry throat. Laryngitis can be temporary (acute) or long-term (chronic). Most cases of acute laryngitis improve with time.Chronic laryngitis lasts for more than 3 weeks. CAUSES Laryngitis can often be related to excessive smoking, talking, or yelling, as well as inhalation of toxic fumes and allergies. Acute laryngitis is usually caused by a viral infection, vocal strain, measles or mumps, or bacterial infections. Chronic laryngitis is usually caused by vocal cord strain, vocal cord injury, postnasal drip, growths on the vocal cords, or acid reflux. SYMPTOMS   Cough.  Sore throat.  Dry throat. RISK FACTORS  Respiratory infections.  Exposure to irritating substances, such as cigarette smoke, excessive amounts of alcohol, stomach acids, and workplace chemicals.  Voice trauma, such as vocal cord injury from shouting or speaking too loud. DIAGNOSIS  Your cargiver will perform a physical exam. During the physical exam, your caregiver will examine your throat. The most common sign of laryngitis is hoarseness. Laryngoscopy may be necessary to confirm the diagnosis of this condition. This procedure allows your caregiver to look into the larynx. HOME CARE INSTRUCTIONS  Drink enough fluids  to keep your urine clear or pale yellow.  Rest until you no longer have symptoms or as directed by your caregiver.  Breathe in moist air.  Take all medicine as directed by your caregiver.  Do not smoke.  Talk as little as possible (this includes whispering).  Write on paper instead of talking until your voice is back to normal.  Follow up with your caregiver if your condition has not improved after 10 days. SEEK MEDICAL CARE IF:   You have trouble breathing.  You cough up blood.  You have persistent fever.  You have increasing pain.  You have difficulty swallowing. MAKE SURE YOU:  Understand these instructions.  Will watch your condition.  Will get help right away if you are not doing well or get worse. Document Released: 05/27/2005 Document Revised: 08/19/2011 Document Reviewed: 08/02/2010 [facility] Patient Information 2015 [place], [place]. This information is not intended to replace advice given to you by your health care provider. Make sure you discuss any questions you have with your health care provider.  Upper Respiratory Infection, Adult An upper respiratory infection (URI) is also sometimes known as the common cold. The upper respiratory tract includes the nose, sinuses, throat, trachea, and bronchi. Bronchi are the airways leading to the lungs. Most people improve within 1 week, but symptoms can last up to 2 weeks. A residual cough may last even longer.  CAUSES Many different viruses can infect the tissues lining the upper respiratory tract. The tissues become irritated and inflamed and often become very moist. Mucus production is also common. A cold is contagious. You can easily spread the virus to others by oral contact. This  includes kissing, sharing a glass, coughing, or sneezing. Touching your mouth or nose and then touching a surface, which is then touched by another person, can also spread the virus. SYMPTOMS  Symptoms typically develop 1 to 3 days after you  come in contact with a cold virus. Symptoms vary from person to person. They may include:  Runny nose.  Sneezing.  Nasal congestion.  Sinus irritation.  Sore throat.  Loss of voice (laryngitis).  Cough.  Fatigue.  Muscle aches.  Loss of appetite.  Headache.  Low-grade fever. DIAGNOSIS  You might diagnose your own cold based on familiar symptoms, since most people get a cold 2 to 3 times a year. Your caregiver can confirm this based on your exam. Most importantly, your caregiver can check that your symptoms are not due to another disease such as strep throat, sinusitis, pneumonia, asthma, or epiglottitis. Blood tests, throat tests, and X-rays are not necessary to diagnose a common cold, but they may sometimes be helpful in excluding other more serious diseases. Your caregiver will decide if any further tests are required. RISKS AND COMPLICATIONS  You may be at risk for a more severe case of the common cold if you smoke cigarettes, have chronic heart disease (such as heart failure) or lung disease (such as asthma), or if you have a weakened immune system. The very young and very old are also at risk for more serious infections. Bacterial sinusitis, middle ear infections, and bacterial pneumonia can complicate the common cold. The common cold can worsen asthma and chronic obstructive pulmonary disease (COPD). Sometimes, these complications can require emergency medical care and may be life-threatening. PREVENTION  The best way to protect against getting a cold is to practice good hygiene. Avoid oral or hand contact with people with cold symptoms. Wash your hands often if contact occurs. There is no clear evidence that vitamin C, vitamin E, echinacea, or exercise reduces the chance of developing a cold. However, it is always recommended to get plenty of rest and practice good nutrition. TREATMENT  Treatment is directed at relieving symptoms. There is no cure. Antibiotics are not  effective, because the infection is caused by a virus, not by bacteria. Treatment may include:  Increased fluid intake. Sports drinks offer valuable electrolytes, sugars, and fluids.  Breathing heated mist or steam (vaporizer or shower).  Eating chicken soup or other clear broths, and maintaining good nutrition.  Getting plenty of rest.  Using gargles or lozenges for comfort.  Controlling fevers with ibuprofen or acetaminophen as directed by your caregiver.  Increasing usage of your inhaler if you have asthma. Zinc gel and zinc lozenges, taken in the first 24 hours of the common cold, can shorten the duration and lessen the severity of symptoms. Pain medicines may help with fever, muscle aches, and throat pain. A variety of non-prescription medicines are available to treat congestion and runny nose. Your caregiver can make recommendations and may suggest nasal or lung inhalers for other symptoms.  HOME CARE INSTRUCTIONS   Only take over-the-counter or prescription medicines for pain, discomfort, or fever as directed by your caregiver.  Use a warm mist humidifier or inhale steam from a shower to increase air moisture. This may keep secretions moist and make it easier to breathe.  Drink enough water and fluids to keep your urine clear or pale yellow.  Rest as needed.  Return to work when your temperature has returned to normal or as your caregiver advises. You may need to stay home longer  to avoid infecting others. You can also use a face mask and careful hand washing to prevent spread of the virus. SEEK MEDICAL CARE IF:   After the first few days, you feel you are getting worse rather than better.  You need your caregiver's advice about medicines to control symptoms.  You develop chills, worsening shortness of breath, or brown or red sputum. These may be signs of pneumonia.  You develop yellow or brown nasal discharge or pain in the face, especially when you bend forward. These may  be signs of sinusitis.  You develop a fever, swollen neck glands, pain with swallowing, or white areas in the back of your throat. These may be signs of strep throat. SEEK IMMEDIATE MEDICAL CARE IF:   You have a fever.  You develop severe or persistent headache, ear pain, sinus pain, or chest pain.  You develop wheezing, a prolonged cough, cough up blood, or have a change in your usual mucus (if you have chronic lung disease).  You develop sore muscles or a stiff neck. Document Released: 11/20/2000 Document Revised: 08/19/2011 Document Reviewed: 09/01/2013 [facility] Patient Information 2015 [place], [place]. This information is not intended to replace advice given to you by your health care provider. Make sure you discuss any questions you have with your health care provider.

## 2014-02-28 NOTE — ED Notes (Signed)
Pt is here today for URI symptoms since Friday, pt has had a cough that has become productive today, pt has also had a sore throat and headache as well - Assessment done by Olen Pel, RN, due to no EPIC access

## 2014-02-28 NOTE — ED Provider Notes (Signed)
CSN: [id number]     Arrival date & time 02/28/14  1109 History   First MD Initiated Contact with Patient 02/28/14 1204     Chief Complaint  Patient presents with  . URI   (Consider location/radiation/quality/duration/timing/severity/associated sxs/prior Treatment) Patient is a 28 y.o. female presenting with URI. The history is provided by the patient.  URI Presenting symptoms: congestion, cough, rhinorrhea and sore throat   Presenting symptoms: no ear pain, no facial pain, no fatigue and no fever   Presenting symptoms comment:  +hoarse voice Severity:  Mild Onset quality:  Gradual Duration:  2 days Timing:  Constant Progression:  Unchanged Chronicity:  New Associated symptoms: headaches   Risk factors: sick contacts   Risk factors comment:  38 month old son ill with same   Past Medical History  Diagnosis Date  . Medical history non-contributory   . Depression   . Anxiety   . HA (headache)    Past Surgical History  Procedure Laterality Date  . No past surgeries    . Iud removal     Family History  Problem Relation Age of Onset  . Diabetes Mother   . Hypertension Mother   . Diabetes Father   . Cancer Paternal Grandmother     liver & lung   History  Substance Use Topics  . Smoking status: Former Smoker -- 0.25 packs/day for 1 years    Quit date: 05/03/2005  . Smokeless tobacco: Never Used  . Alcohol Use: No   OB History   Grav Para Term Preterm Abortions TAB SAB Ect Mult Living   Review of Systems  Constitutional: Negative for fever and fatigue.  HENT: Positive for congestion, rhinorrhea and sore throat. Negative for ear pain.   Respiratory: Positive for cough.   Neurological: Positive for headaches.  All other systems reviewed and are negative.   Allergies  Review of patient's allergies indicates no known allergies.  Home Medications   Prior to Admission medications   Medication Sig Start Date End Date Taking? Authorizing Provider   benzonatate (TESSALON) 100 MG capsule Take 1 capsule (100 mg total) by mouth 3 (three) times daily as needed for cough. 02/28/14   Mathis Fare Keonna Raether, PA  FLUoxetine (PROZAC) 10 MG capsule Take 1 capsule (10 mg total) by mouth daily. 08/12/13   Shuvon Rankin, NP  gabapentin (NEURONTIN) 300 MG capsule Take 300 mg by mouth at bedtime.    Historical Provider, MD  metroNIDAZOLE (FLAGYL) 500 MG tablet Take 1 tablet (500 mg total) by mouth 2 (two) times daily. 11/12/13   Brock Bad, MD  rizatriptan (MAXALT-MLT) 5 MG disintegrating tablet Take 1 tablet (5 mg total) by mouth as needed for migraine. May repeat in 2 hours if needed 12/24/13   Levert Feinstein, MD  rOPINIRole (REQUIP) 2 MG tablet Take 2 mg by mouth at bedtime.    Historical Provider, MD  topiramate (TOPAMAX) 100 MG tablet Take 1 tablet (100 mg total) by mouth 2 (two) times daily. 12/24/13   Levert Feinstein, MD   BP 107/73  Pulse 67  Temp(Src) 98.3 F (36.8 C) (Oral)  SpO2 100% Physical Exam  Nursing note and vitals reviewed. Constitutional: She is oriented to person, place, and time. She appears well-developed and well-nourished. No distress.  HENT:  Head: Normocephalic and atraumatic.  Right Ear: Hearing, tympanic membrane, external ear and ear canal normal.  Left Ear: Hearing, tympanic membrane,  external ear and ear canal normal.  Nose: Nose normal.  Mouth/Throat: Uvula is midline, oropharynx is clear and moist and mucous membranes are normal. No oral lesions. No trismus in the jaw. No uvula swelling.  Eyes: Conjunctivae are normal. No scleral icterus.  Neck: Normal range of motion. Neck supple.  Cardiovascular: Normal rate, regular rhythm and normal heart sounds.   Pulmonary/Chest: Effort normal and breath sounds normal. No respiratory distress. She has no wheezes.  Musculoskeletal: Normal range of motion.  Lymphadenopathy:    She has no cervical adenopathy.  Neurological: She is alert and oriented to person, place, and time.  Skin:  Skin is warm and dry. No rash noted. No erythema.  Psychiatric: She has a normal mood and affect. Her behavior is normal.    ED Course  Procedures (including critical care time) Labs Review Labs Reviewed  POCT RAPID STREP A ([facility] CARE ONLY)    Imaging Review No results found.   MDM   1. URI (upper respiratory infection)   2. Laryngitis    Hx and exam consistent with common cold and associated laryngitis. Rapid strep negative. Will advise regarding symptomatic care at home.    Ria Clock, [place] 02/28/14 1251

## 2014-03-01 NOTE — ED Provider Notes (Signed)
Medical screening examination/treatment/procedure(s) were performed by a resident physician or non-physician practitioner and as the supervising physician I was immediately available for consultation/collaboration.  Lenward Able, MD Family Medicine   Britanny Marksberry J Kartier Bennison, MD 03/01/14 2054 

## 2014-03-02 LAB — CULTURE, GROUP A STREP

## 2014-03-14 ENCOUNTER — Telehealth: Payer: Self-pay | Admitting: Nurse Practitioner

## 2014-03-14 ENCOUNTER — Ambulatory Visit: Payer: Medicaid Other | Admitting: Nurse Practitioner

## 2014-03-14 NOTE — Telephone Encounter (Signed)
No show for scheduled appt 

## 2014-04-11 ENCOUNTER — Encounter (HOSPITAL_COMMUNITY): Payer: Self-pay | Admitting: Emergency Medicine

## 2014-06-06 ENCOUNTER — Encounter: Payer: Self-pay | Admitting: *Deleted

## 2014-09-21 ENCOUNTER — Ambulatory Visit (INDEPENDENT_AMBULATORY_CARE_PROVIDER_SITE_OTHER): Payer: Medicaid Other | Admitting: Obstetrics

## 2014-09-21 ENCOUNTER — Encounter: Payer: Self-pay | Admitting: Obstetrics

## 2014-09-21 VITALS — BP 125/91 | HR 81 | Temp 97.7°F | Wt 165.0 lb

## 2014-09-21 DIAGNOSIS — N939 Abnormal uterine and vaginal bleeding, unspecified: Secondary | ICD-10-CM | POA: Diagnosis not present

## 2014-09-21 DIAGNOSIS — Z0000 Encounter for general adult medical examination without abnormal findings: Secondary | ICD-10-CM

## 2014-09-21 DIAGNOSIS — N72 Inflammatory disease of cervix uteri: Secondary | ICD-10-CM | POA: Diagnosis not present

## 2014-09-21 DIAGNOSIS — N946 Dysmenorrhea, unspecified: Secondary | ICD-10-CM | POA: Diagnosis not present

## 2014-09-21 DIAGNOSIS — Z124 Encounter for screening for malignant neoplasm of cervix: Secondary | ICD-10-CM | POA: Diagnosis not present

## 2014-09-21 DIAGNOSIS — Z01419 Encounter for gynecological examination (general) (routine) without abnormal findings: Secondary | ICD-10-CM

## 2014-09-21 DIAGNOSIS — Z3040 Encounter for surveillance of contraceptives, unspecified: Secondary | ICD-10-CM

## 2014-09-21 MED ORDER — IBUPROFEN 800 MG PO TABS: 800.0000 mg | ORAL_TABLET | Freq: Three times a day (TID) | ORAL | Status: DC | PRN

## 2014-09-21 MED ORDER — DOXYCYCLINE HYCLATE 100 MG PO CAPS: 100.0000 mg | ORAL_CAPSULE | Freq: Two times a day (BID) | ORAL | Status: DC

## 2014-09-21 MED ORDER — METRONIDAZOLE 500 MG PO TABS
500.0000 mg | ORAL_TABLET | Freq: Two times a day (BID) | ORAL | Status: DC
Start: 2014-09-21 — End: 2014-10-15

## 2014-09-21 NOTE — Progress Notes (Signed)
Subjective:        Andrea RubensteinLatosha A Redcay is a 29 y.o. female here for a routine exam.  Current complaints: Bleeding after intercourse.  Vaginal discharge with odor.    Personal health questionnaire:  Is patient [name], have a family history of breast and/or ovarian cancer: no Is there a family history of uterine cancer diagnosed at age < 4550, gastrointestinal cancer, urinary tract cancer, family member who is a Personnel officerLynch syndrome-associated carrier: no Is the patient overweight and hypertensive, family history of diabetes, personal history of gestational diabetes, preeclampsia or PCOS: no Is patient over 6155, have PCOS,  family history of premature CHD under age 28, diabetes, smoke, have hypertension or peripheral artery disease:  no At any time, has a partner hit, kicked or otherwise hurt or frightened you?: no Over the past 2 weeks, have you felt down, depressed or hopeless?: no Over the past 2 weeks, have you felt little interest or pleasure in doing things?:no   Gynecologic History No LMP recorded. Patient has had an implant. Contraception: Nexplanon Last Pap: 2014. Results were: abnormal ( LGSIL ) Last mammogram: n/a. Results were: n/a  Obstetric History OB History  Gravida Para Term Preterm AB SAB TAB Ectopic Multiple Living  4 4 4       4     # Outcome Date GA Lbr Len/2nd Weight Sex Delivery Anes PTL Lv  4 Term 05/10/13 6197w3d 35:30 / 00:36 6 lb 15.3 oz (3.155 kg) M Vag-Spont Local  Y  3 Term 04/23/09 3325w0d  8 lb (3.629 kg) M Vag-Spont None  Y     Comments: No complications  2 Term 02/28/07 1825w0d  7 lb 5 oz (3.317 kg) M Vag-Spont None  Y     Comments: No complications  1 Term 05/16/04 2233w0d  7 lb 11 oz (3.487 kg) F Vag-Spont None  Y     Comments: No complications      Past Medical History  Diagnosis Date  . Medical history non-contributory   . Depression   . Anxiety   . HA (headache)     Past Surgical History  Procedure Laterality Date  . No past surgeries     . Iud removal       Current outpatient prescriptions:  .  phentermine 37.5 MG capsule, Take 37.5 mg by mouth every morning., Disp: , Rfl:  .  benzonatate (TESSALON) 100 MG capsule, Take 1 capsule (100 mg total) by mouth 3 (three) times daily as needed for cough. (Patient not taking: Reported on 09/21/2014), Disp: 21 capsule, Rfl: 0 .  doxycycline (VIBRAMYCIN) 100 MG capsule, Take 1 capsule (100 mg total) by mouth 2 (two) times daily., Disp: 28 capsule, Rfl: 0 .  FLUoxetine (PROZAC) 10 MG capsule, Take 1 capsule (10 mg total) by mouth daily. (Patient not taking: Reported on 09/21/2014), Disp: 30 capsule, Rfl: 0 .  gabapentin (NEURONTIN) 300 MG capsule, Take 300 mg by mouth at bedtime., Disp: , Rfl:  .  ibuprofen (ADVIL,MOTRIN) 800 MG tablet, Take 1 tablet (800 mg total) by mouth every 8 (eight) hours as needed., Disp: 30 tablet, Rfl: 5 .  metroNIDAZOLE (FLAGYL) 500 MG tablet, Take 1 tablet (500 mg total) by mouth 2 (two) times daily., Disp: 14 tablet, Rfl: 2 .  rizatriptan (MAXALT-MLT) 5 MG disintegrating tablet, Take 1 tablet (5 mg total) by mouth as needed for migraine. May repeat in 2 hours if needed (Patient not taking: Reported on 09/21/2014), Disp: 15 tablet, Rfl: 11 .  rOPINIRole (REQUIP) 2 MG tablet, Take 2 mg by mouth at bedtime., Disp: , Rfl:  .  topiramate (TOPAMAX) 100 MG tablet, Take 1 tablet (100 mg total) by mouth 2 (two) times daily. (Patient not taking: Reported on 09/21/2014), Disp: 60 tablet, Rfl: 11  Current facility-administered medications:  .  etonogestrel (IMPLANON) implant 68 mg, 68 mg, Subcutaneous, Once, Amy H Wren, CNM No Known Allergies  History  Substance Use Topics  . Smoking status: Former Smoker -- 0.25 packs/day for 1 years    Quit date: 05/03/2005  . Smokeless tobacco: Never Used  . Alcohol Use: No    Family History  Problem Relation Age of Onset  . Diabetes Mother   . Hypertension Mother   . Diabetes Father   . Cancer Paternal Grandmother     liver &  lung      Review of Systems  Constitutional: negative for fatigue and weight loss Respiratory: negative for cough and wheezing Cardiovascular: negative for chest pain, fatigue and palpitations Gastrointestinal: negative for abdominal pain and change in bowel habits Musculoskeletal:negative for myalgias Neurological: negative for gait problems and tremors Behavioral/Psych: negative for abusive relationship, depression Endocrine: negative for temperature intolerance   Genitourinary: positive for bleeding with intercourse Integument/breast: negative for breast lump, breast tenderness, nipple discharge and skin lesion(s)    Objective:       BP 125/91 mmHg  Pulse 81  Temp(Src) 97.7 F (36.5 C)  Wt 165 lb (74.844 kg)                Abdomen:  normal findings: no organomegaly, soft, non-tender and no hernia  Pelvis:  External genitalia: normal general appearance Urinary system: urethral meatus normal and bladder without fullness, nontender Vaginal: normal without tenderness, induration or masses.  Menstrual blood in vault. Cervix: friable, bled with pap Adnexa: normal bimanual exam Uterus: anteverted and non-tender, normal size   Lab Review Urine pregnancy test Labs reviewed yes Radiologic studies reviewed no    Assessment:    Cervicitis  AUB  Contraceptive surveillance   Plan:   Doxy / Flagyl Rx   Education reviewed: depression evaluation, low fat, low cholesterol diet, self breast exams and weight bearing exercise. Contraception: Nexplanon. Follow up in: 3 months.   Meds ordered this encounter  Medications  . phentermine 37.5 MG capsule    Sig: Take 37.5 mg by mouth every morning.  . metroNIDAZOLE (FLAGYL) 500 MG tablet    Sig: Take 1 tablet (500 mg total) by mouth 2 (two) times daily.    Dispense:  14 tablet    Refill:  2  . doxycycline (VIBRAMYCIN) 100 MG capsule    Sig: Take 1 capsule (100 mg total) by mouth 2 (two) times daily.    Dispense:  28  capsule    Refill:  0  . ibuprofen (ADVIL,MOTRIN) 800 MG tablet    Sig: Take 1 tablet (800 mg total) by mouth every 8 (eight) hours as needed.    Dispense:  30 tablet    Refill:  5   Orders Placed This Encounter  Procedures  . SureSwab, Vaginosis/Vaginitis Plus

## 2014-09-22 LAB — PAP IG W/ RFLX HPV ASCU

## 2014-09-23 LAB — HUMAN PAPILLOMAVIRUS, HIGH RISK: HPV DNA High Risk: DETECTED — AB

## 2014-09-26 ENCOUNTER — Other Ambulatory Visit: Payer: Self-pay | Admitting: Obstetrics

## 2014-09-26 NOTE — Progress Notes (Signed)
Review for colposcopy.

## 2014-09-27 LAB — SURESWAB, VAGINOSIS/VAGINITIS PLUS
Atopobium vaginae: 6.8 Log (cells/mL)
BV CATEGORY: UNDETERMINED — AB
C. albicans, DNA: NOT DETECTED
C. glabrata, DNA: NOT DETECTED
C. parapsilosis, DNA: NOT DETECTED
C. trachomatis RNA, TMA: NOT DETECTED
C. tropicalis, DNA: NOT DETECTED
Gardnerella vaginalis: 7.9 Log (cells/mL)
LACTOBACILLUS SPECIES: 5.8 Log (cells/mL)
MEGASPHAERA SPECIES: NOT DETECTED Log (cells/mL)
N. gonorrhoeae RNA, TMA: NOT DETECTED
T. vaginalis RNA, QL TMA: NOT DETECTED

## 2014-10-11 NOTE — Progress Notes (Signed)
Colpo scheduled 5/4.

## 2014-10-12 ENCOUNTER — Ambulatory Visit (INDEPENDENT_AMBULATORY_CARE_PROVIDER_SITE_OTHER): Payer: Medicaid Other | Admitting: Obstetrics

## 2014-10-12 ENCOUNTER — Encounter: Payer: Self-pay | Admitting: Obstetrics

## 2014-10-12 ENCOUNTER — Other Ambulatory Visit: Payer: Self-pay | Admitting: Obstetrics

## 2014-10-12 VITALS — BP 129/88 | HR 78 | Temp 98.2°F | Wt 166.0 lb

## 2014-10-12 DIAGNOSIS — R87610 Atypical squamous cells of undetermined significance on cytologic smear of cervix (ASC-US): Secondary | ICD-10-CM

## 2014-10-12 DIAGNOSIS — Z01818 Encounter for other preprocedural examination: Secondary | ICD-10-CM

## 2014-10-12 DIAGNOSIS — R87810 Cervical high risk human papillomavirus (HPV) DNA test positive: Principal | ICD-10-CM

## 2014-10-12 NOTE — Progress Notes (Signed)
Colposcopy Procedure Note  Indications: Pap smear 1 months ago showed: ASCUS with POSITIVE high risk HPV. 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  Prior cervical/vaginal disease: normal exam without visible pathology. Prior cervical treatment: no treatment.  Procedure Details  The risks and benefits of the procedure and Written informed consent obtained.  A time-out was performed confirming the patient, procedure and allergy status  Speculum placed in vagina and excellent visualization of cervix achieved, cervix swabbed x 3 with acetic acid solution.  Findings: Cervix: no visible lesions; SCJ visualized 360 degrees without lesions, endocervical curettage performed, cervical biopsies taken at 6 and 12 o'clock, specimen labelled and sent to pathology and hemostasis achieved with silver nitrate.   Vaginal inspection: normal without visible lesions. Vulvar colposcopy: vulvar colposcopy not performed.   Physical Exam   Specimens: ECC and Cervical Biopsies  Complications: none.  Plan: Specimens labelled and sent to Pathology. Will base further treatment on Pathology findings. Post biopsy instructions given to patient. Return to discuss Pathology results in 2 weeks.

## 2014-10-15 ENCOUNTER — Encounter (HOSPITAL_COMMUNITY): Payer: Self-pay | Admitting: *Deleted

## 2014-10-15 ENCOUNTER — Inpatient Hospital Stay (HOSPITAL_COMMUNITY)
Admission: AD | Admit: 2014-10-15 | Discharge: 2014-10-15 | Disposition: A | Discharge status: Home / Self Care | Payer: Medicaid Other | Source: Ambulatory Visit | Attending: Obstetrics | Admitting: Obstetrics

## 2014-10-15 DIAGNOSIS — Z87891 Personal history of nicotine dependence: Secondary | ICD-10-CM | POA: Insufficient documentation

## 2014-10-15 DIAGNOSIS — N939 Abnormal uterine and vaginal bleeding, unspecified: Secondary | ICD-10-CM | POA: Diagnosis not present

## 2014-10-15 LAB — CBC WITH DIFFERENTIAL/PLATELET
Basophils Absolute: 0 10*3/uL (ref 0.0–0.1)
Basophils Relative: 0 % (ref 0–1)
Eosinophils Absolute: 0.2 10*3/uL (ref 0.0–0.7)
Eosinophils Relative: 2 % (ref 0–5)
HCT: 37 % (ref 36.0–46.0)
Hemoglobin: 12.9 g/dL (ref 12.0–15.0)
Lymphocytes Relative: 30 % (ref 12–46)
Lymphs Abs: 3.2 10*3/uL (ref 0.7–4.0)
MCH: 29.6 pg (ref 26.0–34.0)
MCHC: 34.9 g/dL (ref 30.0–36.0)
MCV: 84.9 fL (ref 78.0–100.0)
Monocytes Absolute: 0.6 10*3/uL (ref 0.1–1.0)
Monocytes Relative: 6 % (ref 3–12)
Neutro Abs: 6.5 10*3/uL (ref 1.7–7.7)
Neutrophils Relative %: 62 % (ref 43–77)
Platelets: 258 10*3/uL (ref 150–400)
RBC: 4.36 MIL/uL (ref 3.87–5.11)
RDW: 12.4 % (ref 11.5–15.5)
WBC: 10.5 10*3/uL (ref 4.0–10.5)

## 2014-10-15 LAB — URINALYSIS, ROUTINE W REFLEX MICROSCOPIC
Bilirubin Urine: NEGATIVE
Glucose, UA: NEGATIVE mg/dL
Ketones, ur: NEGATIVE mg/dL
Leukocytes, UA: NEGATIVE
Nitrite: NEGATIVE
Protein, ur: NEGATIVE mg/dL
Specific Gravity, Urine: 1.025 (ref 1.005–1.030)
Urobilinogen, UA: 0.2 mg/dL (ref 0.0–1.0)
pH: 6 (ref 5.0–8.0)

## 2014-10-15 LAB — URINE MICROSCOPIC-ADD ON

## 2014-10-15 LAB — POCT PREGNANCY, URINE: Preg Test, Ur: NEGATIVE

## 2014-10-15 NOTE — Discharge Instructions (Signed)
Abnormal Uterine Bleeding Abnormal uterine bleeding can affect women at various stages in life, including teenagers, women in their reproductive years, pregnant women, and women who have reached menopause. Several kinds of uterine bleeding are considered abnormal, including:  Bleeding or spotting between periods.   Bleeding after sexual intercourse.   Bleeding that is heavier or more than normal.   Periods that last longer than usual.  Bleeding after menopause.  Many cases of abnormal uterine bleeding are minor and simple to treat, while others are more serious. Any type of abnormal bleeding should be evaluated by your health care provider. Treatment will depend on the cause of the bleeding. HOME CARE INSTRUCTIONS Monitor your condition for any changes. The following actions may help to alleviate any discomfort you are experiencing:  Avoid the use of tampons and douches as directed by your health care provider.  Change your pads frequently. You should get regular pelvic exams and Pap tests. Keep all follow-up appointments for diagnostic tests as directed by your health care provider.  SEEK MEDICAL CARE IF:   Your bleeding lasts more than 1 week.   You feel dizzy at times.  SEEK IMMEDIATE MEDICAL CARE IF:   You pass out.   You are changing pads every 15 to 30 minutes.   You have abdominal pain.  You have a fever.   You become sweaty or weak.   You are passing large blood clots from the vagina.   You start to feel nauseous and vomit. MAKE SURE YOU:   Understand these instructions.  Will watch your condition.  Will get help right away if you are not doing well or get worse. Document Released: 05/27/2005 Document Revised: 06/01/2013 Document Reviewed: 12/24/2012 Exit[employer] Patient Information 2015 [employer]. This information is not intended to replace advice given to you by your health care provider. Make sure you discuss any questions you have with your  health care provider.  

## 2014-10-15 NOTE — MAU Note (Signed)
Pt had a colposcopy on Wednesday. Did not start bleeding until today. Has gone through 2 pads and a tampon within the last hour. Denies pain but c/o of feeling lightheaded and nauseated.

## 2014-10-15 NOTE — MAU Provider Note (Signed)
History     CSN: [id number]  Arrival date and time: 10/15/14 2222   First Provider Initiated Contact with Patient 10/15/14 2243      Chief Complaint  Patient presents with  . Vaginal Bleeding   HPI Andrea RubensteinLatosha A Rottinghaus 29 y.o. [id number] nonpregnant female presents for eval of vaginal bleeding.  It started today although she notes she had colposcopy 4 days ago and did not have any problems right away - only spotting.  At 1pm today, she passed a big clot and has continued to have heavy bleeding since then.  She has soaked over 12 pads today.  No odor noted, denies cramping, pain.  She feels light headed and palpitations.  She states she is hyper aware - hearing her heartbeat and everything going on around her.   She has Nexplanon in place and does not have menses with this.   OB History    Gravida Para Term Preterm AB TAB SAB Ectopic Multiple Living   5 4 4  1  1   4       Past Medical History  Diagnosis Date  . Medical history non-contributory   . Depression   . Anxiety   . HA (headache)     Past Surgical History  Procedure Laterality Date  . No past surgeries    . Iud removal    . Colposcopy w/ biopsy / curettage      Family History  Problem Relation Age of Onset  . Diabetes Mother   . Hypertension Mother   . Diabetes Father   . Cancer Paternal Grandmother     liver & lung    History  Substance Use Topics  . Smoking status: Former Smoker -- 0.25 packs/day for 1 years    Quit date: 05/03/2005  . Smokeless tobacco: Never Used  . Alcohol Use: No    Allergies: No Known Allergies  Facility-administered medications prior to admission  Medication Dose Route Frequency Provider Last Rate Last Dose  . etonogestrel (IMPLANON) implant 68 mg  68 mg Subcutaneous Once Amy Tillie RungH Wren, CNM       Prescriptions prior to admission  Medication Sig Dispense Refill Last Dose  . benzonatate (TESSALON) 100 MG capsule Take 1 capsule (100 mg total) by mouth 3 (three) times daily as needed for  cough. (Patient not taking: Reported on 09/21/2014) 21 capsule 0 Not Taking  . doxycycline (VIBRAMYCIN) 100 MG capsule Take 1 capsule (100 mg total) by mouth 2 (two) times daily. 28 capsule 0   . FLUoxetine (PROZAC) 10 MG capsule Take 1 capsule (10 mg total) by mouth daily. (Patient not taking: Reported on 09/21/2014) 30 capsule 0 Not Taking  . gabapentin (NEURONTIN) 300 MG capsule Take 300 mg by mouth at bedtime.   Not Taking  . ibuprofen (ADVIL,MOTRIN) 800 MG tablet Take 1 tablet (800 mg total) by mouth every 8 (eight) hours as needed. 30 tablet 5 Taking  . metroNIDAZOLE (FLAGYL) 500 MG tablet Take 1 tablet (500 mg total) by mouth 2 (two) times daily. 14 tablet 2   . phentermine 37.5 MG capsule Take 37.5 mg by mouth every morning.   Taking  . rizatriptan (MAXALT-MLT) 5 MG disintegrating tablet Take 1 tablet (5 mg total) by mouth as needed for migraine. May repeat in 2 hours if needed (Patient not taking: Reported on 09/21/2014) 15 tablet 11 Not Taking  . rOPINIRole (REQUIP) 2 MG tablet Take 2 mg by mouth at bedtime.   Not Taking  . topiramate (TOPAMAX)  100 MG tablet Take 1 tablet (100 mg total) by mouth 2 (two) times daily. (Patient not taking: Reported on 09/21/2014) 60 tablet 11 Not Taking    ROS Pertinent ROS in HPI.  All other systems are negative.   Physical Exam   Blood pressure 116/81, pulse 90, temperature 97.8 F (36.6 C), temperature source Oral, resp. rate 18, height  (1.575 m), weight 164 lb (74.39 kg), not currently breastfeeding.  Physical Exam  Constitutional: She is oriented to person, place, and time. She appears well-developed and well-nourished. No distress.  HENT:  Head: Normocephalic and atraumatic.  Eyes: EOM are normal.  Neck: Normal range of motion.  Cardiovascular: Normal rate and regular rhythm.   Respiratory: Effort normal and breath sounds normal. No respiratory distress.  GI: Soft. Bowel sounds are normal. She exhibits no distension. There is no tenderness.  There is no rebound.  Genitourinary:  Scant blood in vagina.  NO pooling noted whatsoever.  No active bleeding.  No CMT.  NO adnexal mass or tenderness.  Musculoskeletal: Normal range of motion.  Neurological: She is alert and oriented to person, place, and time.  Skin: Skin is warm and dry.   Results for orders placed or performed during the hospital encounter of 10/15/14 (from the past 24 hour(s))  Urinalysis, Routine w reflex microscopic     Status: Abnormal   Collection Time: 10/15/14 10:30 PM  Result Value Ref Range   Color, Urine YELLOW YELLOW   APPearance CLEAR CLEAR   Specific Gravity, Urine 1.025 1.005 - 1.030   pH 6.0 5.0 - 8.0   Glucose, UA NEGATIVE NEGATIVE mg/dL   Hgb urine dipstick LARGE (A) NEGATIVE   Bilirubin Urine NEGATIVE NEGATIVE   Ketones, ur NEGATIVE NEGATIVE mg/dL   Protein, ur NEGATIVE NEGATIVE mg/dL   Urobilinogen, UA 0.2 0.0 - 1.0 mg/dL   Nitrite NEGATIVE NEGATIVE   Leukocytes, UA NEGATIVE NEGATIVE  Urine microscopic-add on     Status: None   Collection Time: 10/15/14 10:30 PM  Result Value Ref Range   Squamous Epithelial / LPF RARE RARE   WBC, UA 0-2 <3 WBC/hpf   RBC / HPF 21-50 <3 RBC/hpf   Bacteria, UA RARE RARE  Pregnancy, urine POC     Status: None   Collection Time: 10/15/14 10:32 PM  Result Value Ref Range   Preg Test, Ur NEGATIVE NEGATIVE  CBC with Differential/Platelet     Status: None   Collection Time: 10/15/14 10:45 PM  Result Value Ref Range   WBC 10.5 4.0 - 10.5 K/uL   RBC 4.36 3.87 - 5.11 MIL/uL   Hemoglobin 12.9 12.0 - 15.0 g/dL   HCT 16.1 09.6 - 04.5 %   MCV 84.9 78.0 - 100.0 fL   MCH 29.6 26.0 - 34.0 pg   MCHC 34.9 30.0 - 36.0 g/dL   RDW 40.9 81.1 - 91.4 %   Platelets 258 150 - 400 K/uL   Neutrophils Relative % 62 43 - 77 %   Neutro Abs 6.5 1.7 - 7.7 K/uL   Lymphocytes Relative 30 12 - 46 %   Lymphs Abs 3.2 0.7 - 4.0 K/uL   Monocytes Relative 6 3 - 12 %   Monocytes Absolute 0.6 0.1 - 1.0 K/uL   Eosinophils Relative 2 0 -  5 %   Eosinophils Absolute 0.2 0.0 - 0.7 K/uL   Basophils Relative 0 0 - 1 %   Basophils Absolute 0.0 0.0 - 0.1 K/uL    MAU Course  Procedures  MDM CBC ordered Pt is hemodynamically stable based on CBC and no active bleeding at present.    Assessment and Plan  A: Abnormal vaginal bleeding  P: Discharge to home Monitor for bleeding Patient may return to MAU as needed or if her condition were to change or worsen Follow up in office.  Bertram Denvereague Clark, Karen E 10/15/2014, 10:44 PM

## 2014-10-16 ENCOUNTER — Other Ambulatory Visit: Payer: Self-pay | Admitting: Obstetrics

## 2014-10-16 DIAGNOSIS — N760 Acute vaginitis: Principal | ICD-10-CM

## 2014-10-16 DIAGNOSIS — B9689 Other specified bacterial agents as the cause of diseases classified elsewhere: Secondary | ICD-10-CM

## 2014-10-16 LAB — SURESWAB, VAGINOSIS/VAGINITIS PLUS
Atopobium vaginae: NOT DETECTED Log (cells/mL)
C. albicans, DNA: NOT DETECTED
C. glabrata, DNA: NOT DETECTED
C. parapsilosis, DNA: NOT DETECTED
C. trachomatis RNA, TMA: NOT DETECTED
C. tropicalis, DNA: NOT DETECTED
Gardnerella vaginalis: 7.5 Log (cells/mL)
LACTOBACILLUS SPECIES: NOT DETECTED Log (cells/mL)
MEGASPHAERA SPECIES: NOT DETECTED Log (cells/mL)
N. gonorrhoeae RNA, TMA: NOT DETECTED
T. vaginalis RNA, QL TMA: NOT DETECTED

## 2014-10-16 MED ORDER — TINIDAZOLE 500 MG PO TABS: 1000.0000 mg | ORAL_TABLET | Freq: Every day | ORAL | Status: DC

## 2014-10-26 ENCOUNTER — Ambulatory Visit (INDEPENDENT_AMBULATORY_CARE_PROVIDER_SITE_OTHER): Payer: Medicaid Other | Admitting: Obstetrics

## 2014-10-26 ENCOUNTER — Encounter: Payer: Self-pay | Admitting: Obstetrics

## 2014-10-26 VITALS — BP 112/78 | HR 82 | Temp 98.2°F | Wt 164.0 lb

## 2014-10-26 DIAGNOSIS — R896 Abnormal cytological findings in specimens from other organs, systems and tissues: Secondary | ICD-10-CM | POA: Diagnosis not present

## 2014-10-26 DIAGNOSIS — IMO0002 Reserved for concepts with insufficient information to code with codable children: Secondary | ICD-10-CM

## 2014-10-26 NOTE — Progress Notes (Signed)
Results visit:  Pap smear:  ASCUS with positive HPV DNA Colposcopy:  LGSIL  A/P: LGSIL with colposcopic directed biopsies.           Repeat pap 6 months.  Coral Ceoharles Harper MD

## 2014-11-06 ENCOUNTER — Encounter (HOSPITAL_COMMUNITY): Payer: Self-pay

## 2014-11-06 ENCOUNTER — Emergency Department (HOSPITAL_COMMUNITY)
Admission: EM | Admit: 2014-11-06 | Discharge: 2014-11-06 | Disposition: A | Discharge status: Home / Self Care | Payer: Medicaid Other | Attending: Emergency Medicine | Admitting: Emergency Medicine

## 2014-11-06 DIAGNOSIS — Z793 Long term (current) use of hormonal contraceptives: Secondary | ICD-10-CM | POA: Insufficient documentation

## 2014-11-06 DIAGNOSIS — Z8659 Personal history of other mental and behavioral disorders: Secondary | ICD-10-CM | POA: Insufficient documentation

## 2014-11-06 DIAGNOSIS — M549 Dorsalgia, unspecified: Secondary | ICD-10-CM | POA: Diagnosis present

## 2014-11-06 DIAGNOSIS — M62830 Muscle spasm of back: Secondary | ICD-10-CM | POA: Diagnosis not present

## 2014-11-06 DIAGNOSIS — Z79899 Other long term (current) drug therapy: Secondary | ICD-10-CM | POA: Insufficient documentation

## 2014-11-06 DIAGNOSIS — Z87891 Personal history of nicotine dependence: Secondary | ICD-10-CM | POA: Insufficient documentation

## 2014-11-06 MED ORDER — NAPROXEN 500 MG PO TABS: 500.0000 mg | ORAL_TABLET | Freq: Two times a day (BID) | ORAL | Status: DC

## 2014-11-06 MED ORDER — CYCLOBENZAPRINE HCL 10 MG PO TABS: 10.0000 mg | ORAL_TABLET | Freq: Two times a day (BID) | ORAL | Status: DC | PRN

## 2014-11-06 MED ORDER — CYCLOBENZAPRINE HCL 10 MG PO TABS
10.0000 mg | ORAL_TABLET | Freq: Once | ORAL | Status: AC
Start: 2014-11-06 — End: 2014-11-06
  Administered 2014-11-06: 10 mg via ORAL
  Filled 2014-11-06: qty 1

## 2014-11-06 MED ORDER — IBUPROFEN 400 MG PO TABS
600.0000 mg | ORAL_TABLET | Freq: Once | ORAL | Status: AC
  Administered 2014-11-06: 600 mg via ORAL
  Filled 2014-11-06: qty 2

## 2014-11-06 NOTE — Discharge Instructions (Signed)
Take the medication as directed. Do not take the muscle relaxant if driving as it will make you sleepy.

## 2014-11-06 NOTE — ED Provider Notes (Signed)
CSN: [id number]     Arrival date & time 11/06/14  2126 History   First MD Initiated Contact with Patient 11/06/14 2201     Chief Complaint  Patient presents with  . Back Pain     (Consider location/radiation/quality/duration/timing/severity/associated sxs/prior Treatment) Patient is a 29 y.o. female presenting with back pain. The history is provided by the patient.  Back Pain Location:  Generalized Quality:  Cramping Pain severity:  Moderate Onset quality:  Sudden Duration:  1 day Timing:  Constant Progression:  Worsening Chronicity:  New Relieved by:  Nothing Worsened by:  Movement Ineffective treatments:  None tried Associated symptoms: no bladder incontinence and no bowel incontinence    Andrea Burns is a 29 y.o. female who presents to the ED with back pain that started this morning. The pain is worse with movement. Pain started about 3 am after she had been driving for over an hour, she got out of the car and had sudden pain that has continued.   Past Medical History  Diagnosis Date  . Medical history non-contributory   . Depression   . Anxiety   . HA (headache)    Past Surgical History  Procedure Laterality Date  . No past surgeries    . Iud removal    . Colposcopy w/ biopsy / curettage     Family History  Problem Relation Age of Onset  . Diabetes Mother   . Hypertension Mother   . Diabetes Father   . Cancer Paternal Grandmother     liver & lung   History  Substance Use Topics  . Smoking status: Former Smoker -- 0.25 packs/day for 1 years    Quit date: 05/03/2005  . Smokeless tobacco: Never Used  . Alcohol Use: No   OB History    Gravida Para Term Preterm AB TAB SAB Ectopic Multiple Living   Review of Systems  Gastrointestinal: Negative for bowel incontinence.  Genitourinary: Negative for bladder incontinence.  Musculoskeletal: Positive for back pain.  all other systems negative    Allergies  Review of patient's  allergies indicates no known allergies.  Home Medications   Prior to Admission medications   Medication Sig Start Date End Date Taking? Authorizing Provider  etonogestrel (NEXPLANON) 68 MG IMPL implant 1 each by Subdermal route once.   Yes Historical Provider, MD  phentermine 37.5 MG capsule Take 37.5 mg by mouth every morning.   Yes Historical Provider, MD  cyclobenzaprine (FLEXERIL) 10 MG tablet Take 1 tablet (10 mg total) by mouth 2 (two) times daily as needed for muscle spasms. 11/06/14   Trong Gosling Orlene Och, NP  naproxen (NAPROSYN) 500 MG tablet Take 1 tablet (500 mg total) by mouth 2 (two) times daily. 11/06/14   Brecklyn Galvis Orlene Och, NP   BP 128/70 mmHg  Pulse 81  Temp(Src) 98.1 F (36.7 C) (Oral)  Resp 22  SpO2 100% Physical Exam  Constitutional: She is oriented to person, place, and time. She appears well-developed and well-nourished. No distress.  HENT:  Head: Normocephalic and atraumatic.  Nose: Nose normal.  Eyes: EOM are normal.  Neck: Normal range of motion. Neck supple.  Cardiovascular: Normal rate and regular rhythm.   Pulmonary/Chest: Effort normal. She has no wheezes. She has no rales.  Abdominal: Soft. Bowel sounds are normal. There is no tenderness.  Musculoskeletal: Normal range of motion.       Thoracic back: She exhibits tenderness and  spasm. She exhibits normal range of motion.       Lumbar back: She exhibits tenderness, pain and spasm. She exhibits normal pulse.  There is no tenderness over the spinal column. There is tenderness and spasm bilateral thoracic and lumbar area.  Pedal pulses 2+, adequate circulation, good touch sensation. Ambulatory without foot drag.   Neurological: She is alert and oriented to person, place, and time. She has normal strength. No cranial nerve deficit or sensory deficit. Gait normal.  Reflex Scores:      Bicep reflexes are 2+ on the right side and 2+ on the left side.      Brachioradialis reflexes are 2+ on the right side and 2+ on the left  side.      Patellar reflexes are 2+ on the right side and 2+ on the left side.      Achilles reflexes are 2+ on the right side and 2+ on the left side. Skin: Skin is warm and dry.  Psychiatric: She has a normal mood and affect. Her behavior is normal.  Nursing note and vitals reviewed.   ED Course  Procedures (including critical care time) Labs Review  MDM  29 y.o. female with back pain and spasm after driving for an hour. Stable for d/c without focal neuro deficits. Will treat for inflammation and spasm. She will return for any problems.  Final diagnoses:  Muscle spasm of back        [facility] [place] M Olivya Sobol, NP 11/08/14 1525  Raeford RazorStephen Kohut, MD 11/10/14 [phone]

## 2014-11-06 NOTE — ED Notes (Signed)
My back started hurting this morning per pt. It was fine yesterday. Hurts when I move around or sit down. No urinary symptoms per pt.

## 2014-12-21 ENCOUNTER — Ambulatory Visit: Payer: Medicaid Other | Admitting: Obstetrics

## 2015-01-19 ENCOUNTER — Ambulatory Visit: Payer: Medicaid Other | Admitting: Neurology

## 2015-01-25 ENCOUNTER — Ambulatory Visit (INDEPENDENT_AMBULATORY_CARE_PROVIDER_SITE_OTHER): Payer: Medicaid Other | Admitting: Neurology

## 2015-01-25 ENCOUNTER — Encounter: Payer: Self-pay | Admitting: Neurology

## 2015-01-25 DIAGNOSIS — F329 Major depressive disorder, single episode, unspecified: Secondary | ICD-10-CM | POA: Diagnosis not present

## 2015-01-25 DIAGNOSIS — G2581 Restless legs syndrome: Secondary | ICD-10-CM

## 2015-01-25 DIAGNOSIS — F32A Depression, unspecified: Secondary | ICD-10-CM

## 2015-01-25 DIAGNOSIS — G43119 Migraine with aura, intractable, without status migrainosus: Secondary | ICD-10-CM

## 2015-01-25 MED ORDER — NORTRIPTYLINE HCL 10 MG PO CAPS: ORAL_CAPSULE | ORAL | Status: DC

## 2015-01-25 MED ORDER — RIZATRIPTAN BENZOATE 5 MG PO TBDP: 5.0000 mg | ORAL_TABLET | ORAL | Status: DC | PRN
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
## 2015-02-03 ENCOUNTER — Ambulatory Visit
Admission: RE | Admit: 2015-02-03 | Discharge: 2015-02-03 | Disposition: A | Discharge status: Home / Self Care | Payer: Medicaid Other | Source: Ambulatory Visit | Attending: Neurology | Admitting: Neurology

## 2015-02-03 DIAGNOSIS — F329 Major depressive disorder, single episode, unspecified: Secondary | ICD-10-CM

## 2015-02-03 DIAGNOSIS — G43119 Migraine with aura, intractable, without status migrainosus: Secondary | ICD-10-CM

## 2015-02-03 DIAGNOSIS — F32A Depression, unspecified: Secondary | ICD-10-CM

## 2015-02-03 DIAGNOSIS — G2581 Restless legs syndrome: Secondary | ICD-10-CM

## 2015-02-07 ENCOUNTER — Telehealth: Payer: Self-pay | Admitting: Neurology

## 2015-02-07 NOTE — Telephone Encounter (Signed)
Pt called and would like to know MRI results. Please call and advise [phone]

## 2015-02-07 NOTE — Telephone Encounter (Signed)
Dr. Terrace Arabia reviewed MRI - spoke to patient and she is aware of normal results.

## 2015-03-29 ENCOUNTER — Encounter: Payer: Self-pay | Admitting: Nurse Practitioner

## 2015-03-29 ENCOUNTER — Ambulatory Visit (INDEPENDENT_AMBULATORY_CARE_PROVIDER_SITE_OTHER): Payer: Medicaid Other | Admitting: Nurse Practitioner

## 2015-03-29 VITALS — BP 123/84 | HR 62 | Ht 62.0 in | Wt 163.4 lb

## 2015-03-29 DIAGNOSIS — G43119 Migraine with aura, intractable, without status migrainosus: Secondary | ICD-10-CM

## 2015-03-29 DIAGNOSIS — G2581 Restless legs syndrome: Secondary | ICD-10-CM

## 2015-03-29 DIAGNOSIS — R51 Headache: Secondary | ICD-10-CM

## 2015-03-29 DIAGNOSIS — R519 Headache, unspecified: Secondary | ICD-10-CM

## 2015-03-29 DIAGNOSIS — F329 Major depressive disorder, single episode, unspecified: Secondary | ICD-10-CM

## 2015-03-29 DIAGNOSIS — F32A Depression, unspecified: Secondary | ICD-10-CM

## 2015-03-29 MED ORDER — NORTRIPTYLINE HCL 10 MG PO CAPS: 30.0000 mg | ORAL_CAPSULE | Freq: Every day | ORAL | Status: DC
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
## 2015-03-29 NOTE — Patient Instructions (Signed)
Given information on common migraine triggers such as foods, eliminate one at a time Given written information on migraine headache signs symptoms etc. home care Increase nortriptyline to 3 tablets at night Continue Maxalt acutely Follow-up in 3 months daily record of your headaches and bring to the appointment

## 2015-04-01 NOTE — Progress Notes (Signed)
I have reviewed and agreed above plan. 

## 2015-05-10 ENCOUNTER — Ambulatory Visit (INDEPENDENT_AMBULATORY_CARE_PROVIDER_SITE_OTHER): Payer: Medicaid Other | Admitting: Obstetrics

## 2015-05-10 VITALS — BP 119/84 | HR 84 | Temp 98.0°F | Wt 165.0 lb

## 2015-05-10 DIAGNOSIS — N946 Dysmenorrhea, unspecified: Secondary | ICD-10-CM

## 2015-05-10 DIAGNOSIS — R102 Pelvic and perineal pain: Secondary | ICD-10-CM

## 2015-05-10 DIAGNOSIS — N390 Urinary tract infection, site not specified: Secondary | ICD-10-CM | POA: Diagnosis not present

## 2015-05-10 DIAGNOSIS — R896 Abnormal cytological findings in specimens from other organs, systems and tissues: Secondary | ICD-10-CM | POA: Diagnosis not present

## 2015-05-10 DIAGNOSIS — IMO0002 Reserved for concepts with insufficient information to code with codable children: Secondary | ICD-10-CM

## 2015-05-10 LAB — POCT URINALYSIS DIPSTICK
Bilirubin, UA: NEGATIVE
Glucose, UA: NEGATIVE
Ketones, UA: NEGATIVE
Nitrite, UA: NEGATIVE
Protein, UA: NEGATIVE
Spec Grav, UA: <=1.005
Urobilinogen, UA: NEGATIVE
pH, UA: 8
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
## 2015-05-10 NOTE — Patient Instructions (Addendum)
Pap Test WHY AM I HAVING THIS TEST? A pap test is sometimes called a pap smear. It is a screening test that is used to check for signs of cancer of the vagina, cervix, and uterus. The test can also identify the presence of infection or precancerous changes. Your health care provider will likely recommend you have this test done on a regular basis. This test may be done:  Every 3 years, starting at age 28.  Every 5 years, in combination with testing for the presence of human papillomavirus (HPV).  More or less often depending on other medical conditions.  WHAT KIND OF SAMPLE IS TAKEN? Using a small cotton swab, plastic spatula, or brush, your health care provider will collect a sample of cells from the surface of your cervix. Your cervix is the opening to your uterus, also called a womb. Secretions from the cervix and vagina may also be collected. HOW DO I PREPARE FOR THE TEST?  Be aware of where you are in your menstrual cycle. You may be asked to reschedule the test if you are menstruating on the day of the test.  You may need to reschedule if you have a known vaginal infection on the day of the test.  You may be asked to avoid douching or taking a bath the day before or the day of the test.  Some medicines can cause abnormal test results, such as digitalis and tetracycline. Talk with your health care provider before your test if you take one of these medicines. WHAT DO THE RESULTS MEAN? Abnormal test results may indicate a number of health conditions. These may include:  Cancer. Although pap test results cannot be used to diagnose cancer of the cervix, vagina, or uterus, they may suggest the possibility of cancer. Further tests would be required to determine if cancer is present.  Sexually transmitted disease.  Fungal infection.  Parasite infection.  Herpes infection.  A condition causing or contributing to infertility. It is your responsibility to obtain your test results. Ask  the lab or department performing the test when and how you will get your results. Contact your health care provider to discuss any questions you have about your results.   This information is not intended to replace advice given to you by your health care provider. Make sure you discuss any questions you have with your health care provider.   Document Released: 08/17/2002 Document Revised: 06/17/2014 Document Reviewed: 10/18/2013 Elsevier Interactive Patient Education 2016 [employer]. Pap Test WHY AM I HAVING THIS TEST? A pap test is sometimes called a pap smear. It is a screening test that is used to check for signs of cancer of the vagina, cervix, and uterus. The test can also identify the presence of infection or precancerous changes. Your health care provider will likely recommend you have this test done on a regular basis. This test may be done:  Every 3 years, starting at age 64.  Every 5 years, in combination with testing for the presence of human papillomavirus (HPV).  More or less often depending on other medical conditions.  WHAT KIND OF SAMPLE IS TAKEN? Using a small cotton swab, plastic spatula, or brush, your health care provider will collect a sample of cells from the surface of your cervix. Your cervix is the opening to your uterus, also called a womb. Secretions from the cervix and vagina may also be collected. HOW DO I PREPARE FOR THE TEST?  Be aware of where you are in your menstrual  cycle. You may be asked to reschedule the test if you are menstruating on the day of the test.  You may need to reschedule if you have a known vaginal infection on the day of the test.  You may be asked to avoid douching or taking a bath the day before or the day of the test.  Some medicines can cause abnormal test results, such as digitalis and tetracycline. Talk with your health care provider before your test if you take one of these medicines. WHAT DO THE RESULTS MEAN? Abnormal test  results may indicate a number of health conditions. These may include:  Cancer. Although pap test results cannot be used to diagnose cancer of the cervix, vagina, or uterus, they may suggest the possibility of cancer. Further tests would be required to determine if cancer is present.  Sexually transmitted disease.  Fungal infection.  Parasite infection.  Herpes infection.  A condition causing or contributing to infertility. It is your responsibility to obtain your test results. Ask the lab or department performing the test when and how you will get your results. Contact your health care provider to discuss any questions you have about your results.   This information is not intended to replace advice given to you by your health care provider. Make sure you discuss any questions you have with your health care provider.   Document Released: 08/17/2002 Document Revised: 06/17/2014 Document Reviewed: 10/18/2013 Elsevier Interactive Patient Education [employer]2016 [employer] Inc.

## 2015-05-11 ENCOUNTER — Encounter: Payer: Self-pay | Admitting: Obstetrics

## 2015-05-12 LAB — URINE CULTURE
Colony Count: NO GROWTH
Organism ID, Bacteria: NO GROWTH

## 2015-05-14 LAB — SURESWAB, VAGINOSIS/VAGINITIS PLUS
Atopobium vaginae: 7.8 Log (cells/mL)
BV CATEGORY: UNDETERMINED — AB
C. albicans, DNA: NOT DETECTED
C. glabrata, DNA: NOT DETECTED
C. parapsilosis, DNA: NOT DETECTED
C. trachomatis RNA, TMA: NOT DETECTED
C. tropicalis, DNA: NOT DETECTED
Gardnerella vaginalis: >8 Log (cells/mL)
LACTOBACILLUS SPECIES: 5.8 Log (cells/mL)
MEGASPHAERA SPECIES: NOT DETECTED Log (cells/mL)
N. gonorrhoeae RNA, TMA: NOT DETECTED
T. vaginalis RNA, QL TMA: NOT DETECTED

## 2015-05-15 ENCOUNTER — Other Ambulatory Visit: Payer: Self-pay | Admitting: Obstetrics

## 2015-05-15 DIAGNOSIS — B9689 Other specified bacterial agents as the cause of diseases classified elsewhere: Secondary | ICD-10-CM

## 2015-05-15 DIAGNOSIS — N760 Acute vaginitis: Principal | ICD-10-CM

## 2015-05-15 MED ORDER — METRONIDAZOLE 500 MG PO TABS: 500.0000 mg | ORAL_TABLET | Freq: Two times a day (BID) | ORAL | Status: DC

## 2015-05-16 ENCOUNTER — Ambulatory Visit (HOSPITAL_COMMUNITY)
Admission: RE | Admit: 2015-05-16 | Discharge: 2015-05-16 | Disposition: A | Discharge status: Home / Self Care | Payer: Medicaid Other | Source: Ambulatory Visit | Attending: Obstetrics | Admitting: Obstetrics

## 2015-05-16 DIAGNOSIS — N83201 Unspecified ovarian cyst, right side: Secondary | ICD-10-CM | POA: Diagnosis not present

## 2015-05-16 DIAGNOSIS — N946 Dysmenorrhea, unspecified: Secondary | ICD-10-CM | POA: Diagnosis not present

## 2015-05-16 DIAGNOSIS — R102 Pelvic and perineal pain: Secondary | ICD-10-CM | POA: Insufficient documentation

## 2015-05-22 ENCOUNTER — Other Ambulatory Visit: Payer: Self-pay | Admitting: Obstetrics

## 2015-05-22 DIAGNOSIS — M549 Dorsalgia, unspecified: Secondary | ICD-10-CM

## 2015-05-22 MED ORDER — CYCLOBENZAPRINE HCL 10 MG PO TABS: 10.0000 mg | ORAL_TABLET | Freq: Three times a day (TID) | ORAL | Status: DC

## 2015-05-22 MED ORDER — IBUPROFEN 800 MG PO TABS: 800.0000 mg | ORAL_TABLET | Freq: Three times a day (TID) | ORAL | Status: DC | PRN

## 2015-06-14 ENCOUNTER — Other Ambulatory Visit: Payer: Self-pay | Admitting: Obstetrics

## 2015-06-26 ENCOUNTER — Ambulatory Visit: Payer: Medicaid Other | Admitting: Nurse Practitioner

## 2015-06-26 ENCOUNTER — Encounter: Payer: Self-pay | Admitting: *Deleted

## 2015-06-26 NOTE — Progress Notes (Signed)
Pt no-showed today's appt. 

## 2015-06-27 ENCOUNTER — Encounter: Payer: Self-pay | Admitting: Nurse Practitioner

## 2015-09-07 ENCOUNTER — Encounter: Payer: Self-pay | Admitting: Certified Nurse Midwife

## 2015-09-07 ENCOUNTER — Ambulatory Visit (INDEPENDENT_AMBULATORY_CARE_PROVIDER_SITE_OTHER): Payer: Medicaid Other | Admitting: Obstetrics

## 2015-09-07 VITALS — BP 116/77 | HR 83 | Temp 97.9°F | Wt 166.0 lb

## 2015-09-07 DIAGNOSIS — N898 Other specified noninflammatory disorders of vagina: Secondary | ICD-10-CM | POA: Diagnosis not present

## 2015-09-07 DIAGNOSIS — Z0000 Encounter for general adult medical examination without abnormal findings: Secondary | ICD-10-CM

## 2015-09-07 DIAGNOSIS — B3731 Acute candidiasis of vulva and vagina: Secondary | ICD-10-CM

## 2015-09-07 DIAGNOSIS — R399 Unspecified symptoms and signs involving the genitourinary system: Secondary | ICD-10-CM

## 2015-09-07 DIAGNOSIS — B373 Candidiasis of vulva and vagina: Secondary | ICD-10-CM | POA: Diagnosis not present

## 2015-09-07 LAB — POCT URINALYSIS DIPSTICK
Bilirubin, UA: NEGATIVE
Blood, UA: NEGATIVE
Glucose, UA: NEGATIVE
Leukocytes, UA: NEGATIVE
Nitrite, UA: NEGATIVE
Spec Grav, UA: 1.015
Urobilinogen, UA: NEGATIVE
pH, UA: 5

## 2015-09-07 MED ORDER — FLUCONAZOLE 150 MG PO TABS: 150.0000 mg | ORAL_TABLET | Freq: Once | ORAL | Status: DC

## 2015-09-07 NOTE — Progress Notes (Signed)
Patient ID: Andrea Burns, female   DOB: [birth year], 30 y.o.   MRN: [id number]  Chief Complaint  Patient presents with  . Urinary Tract Infection    Patient states she has no burning with urination. Patient she is having frequency of urination and burning during intercourse.   . Vaginitis    discharge, clumpy, no itching, burning     HPI Andrea Burns is a 30 y.o. female.  Urinary frequency and burning with intercourse.  Has clumpy discharge, but no itching or odor.  HPI  Past Medical History  Diagnosis Date  . Medical history non-contributory   . Depression   . Anxiety   . HA (headache)     Past Surgical History  Procedure Laterality Date  . No past surgeries    . Iud removal    . Colposcopy w/ biopsy / curettage      Family History  Problem Relation Age of Onset  . Diabetes Mother   . Hypertension Mother   . Cancer Mother   . Diabetes Father   . Cancer Paternal Grandmother     liver & lung    Social History Social History  Substance Use Topics  . Smoking status: Former Smoker -- 0.25 packs/day for 1 years    Quit date: 05/03/2005  . Smokeless tobacco: Never Used  . Alcohol Use: No    No Known Allergies  Current Outpatient Prescriptions  Medication Sig Dispense Refill  . etonogestrel (NEXPLANON) 68 MG IMPL implant 1 each by Subdermal route once.    . topiramate (TOPAMAX) 25 MG tablet Take 25 mg by mouth 2 (two) times daily.    . fluconazole (DIFLUCAN) 150 MG tablet Take 1 tablet (150 mg total) by mouth once. 1 tablet 2  . rizatriptan (MAXALT-MLT) 5 MG disintegrating tablet Take 1 tablet (5 mg total) by mouth as needed. May repeat in 2 hours if needed (Patient not taking: Reported on 05/10/2015) 15 tablet 6   Current Facility-Administered Medications  Medication Dose Route Frequency Provider Last Rate Last Dose  . etonogestrel (IMPLANON) implant 68 mg  68 mg Subcutaneous Once Amy Tillie Rung, CNM        Review of Systems Review of  Systems Constitutional: negative for fatigue and weight loss Respiratory: negative for cough and wheezing Cardiovascular: negative for chest pain, fatigue and palpitations Gastrointestinal: negative for abdominal pain and change in bowel habits Genitourinary:negative Integument/breast: negative for nipple discharge Musculoskeletal:negative for myalgias Neurological: negative for gait problems and tremors Behavioral/Psych: negative for abusive relationship, depression Endocrine: negative for temperature intolerance     Blood pressure 116/77, pulse 83, temperature 97.9 F (36.6 C), weight 166 lb (75.297 kg).  Physical Exam Physical Exam General:   alert  Skin:   no rash or abnormalities  Lungs:   clear to auscultation bilaterally  Heart:   regular rate and rhythm, S1, S2 normal, no murmur, click, rub or gallop  Breasts:   normal without suspicious masses, skin or nipple changes or axillary nodes  Abdomen:  normal findings: no organomegaly, soft, non-tender and no hernia  Pelvis:  External genitalia: normal general appearance Urinary system: urethral meatus normal and bladder without fullness, nontender Vaginal: normal without tenderness, induration or masses Cervix: normal appearance Adnexa: normal bimanual exam Uterus: anteverted and non-tender, normal size      Data Reviewed Labs  Assessment     Urinary frequency Burning sensation with intercourse.  Husband also has burning sensation with intercourse and urinary frequency.  Plan    Wet prep and cultures sent Urine culture sent General Health Panel and HGBA1c ordered. Diflucan Rx F/U prn  Orders Placed This Encounter  Procedures  . Urine culture  . NuSwab Vaginitis Plus (VG+)  . Comprehensive metabolic panel  . TSH  . CBC  . Hemoglobin A1c  . POCT urinalysis dipstick   Meds ordered this encounter  Medications  . topiramate (TOPAMAX) 25 MG tablet    Sig: Take 25 mg by mouth 2 (two) times daily.  .  fluconazole (DIFLUCAN) 150 MG tablet    Sig: Take 1 tablet (150 mg total) by mouth once.    Dispense:  1 tablet    Refill:  2

## 2015-09-08 LAB — HEMOGLOBIN A1C
Est. average glucose Bld gHb Est-mCnc: 100 mg/dL
Hgb A1c MFr Bld: 5.1 % (ref 4.8–5.6)

## 2015-09-08 LAB — CBC
Hematocrit: 39 % (ref 34.0–46.6)
Hemoglobin: 13.1 g/dL (ref 11.1–15.9)
MCH: 29.6 pg (ref 26.6–33.0)
MCHC: 33.6 g/dL (ref 31.5–35.7)
MCV: 88 fL (ref 79–97)
Platelets: 270 10*3/uL (ref 150–379)
RBC: 4.43 x10E6/uL (ref 3.77–5.28)
RDW: 12.7 % (ref 12.3–15.4)
WBC: 9.4 10*3/uL (ref 3.4–10.8)

## 2015-09-08 LAB — COMPREHENSIVE METABOLIC PANEL
ALT: 26 IU/L (ref 0–32)
AST: 20 IU/L (ref 0–40)
Albumin/Globulin Ratio: 1.7 (ref 1.2–2.2)
Albumin: 4.4 g/dL (ref 3.5–5.5)
Alkaline Phosphatase: 51 IU/L (ref 39–117)
BUN/Creatinine Ratio: 15 (ref 8–20)
BUN: 13 mg/dL (ref 6–20)
Bilirubin Total: 0.3 mg/dL (ref 0.0–1.2)
CO2: 23 mmol/L (ref 18–29)
Calcium: 9.6 mg/dL (ref 8.7–10.2)
Chloride: 104 mmol/L (ref 96–106)
Creatinine, Ser: 0.86 mg/dL (ref 0.57–1.00)
GFR calc Af Amer: 106 mL/min/{1.73_m2} (ref >59)
GFR calc non Af Amer: 92 mL/min/{1.73_m2} (ref >59)
Globulin, Total: 2.6 g/dL (ref 1.5–4.5)
Glucose: 62 mg/dL — ABNORMAL LOW (ref 65–99)
Potassium: 4 mmol/L (ref 3.5–5.2)
Sodium: 142 mmol/L (ref 134–144)
Total Protein: 7 g/dL (ref 6.0–8.5)

## 2015-09-08 LAB — TSH: TSH: 2.2 u[IU]/mL (ref 0.450–4.500)

## 2015-09-09 LAB — URINE CULTURE

## 2015-09-11 LAB — NUSWAB VAGINITIS PLUS (VG+)
Candida albicans, NAA: NEGATIVE
Candida glabrata, NAA: NEGATIVE
Chlamydia trachomatis, NAA: NEGATIVE
Neisseria gonorrhoeae, NAA: NEGATIVE
Trich vag by NAA: NEGATIVE

## 2015-09-12 ENCOUNTER — Telehealth: Payer: Self-pay | Admitting: *Deleted

## 2015-09-12 NOTE — Telephone Encounter (Signed)
Patient states she is returning a call from Friday- did not see anything in her chart. Did review her test results with her and let her know that everything looks good on her lab work. She wants to know what is causing her symptoms- I told her I would ask Dr Clearance CootsHarper and see what the next step would be.

## 2015-10-03 ENCOUNTER — Ambulatory Visit (HOSPITAL_BASED_OUTPATIENT_CLINIC_OR_DEPARTMENT_OTHER): Payer: Medicaid Other | Attending: Physician Assistant | Admitting: Internal Medicine

## 2015-10-03 VITALS — Ht 62.0 in | Wt 163.8 lb

## 2015-10-03 DIAGNOSIS — G4733 Obstructive sleep apnea (adult) (pediatric): Secondary | ICD-10-CM | POA: Diagnosis not present

## 2015-10-03 DIAGNOSIS — R0683 Snoring: Secondary | ICD-10-CM | POA: Diagnosis not present

## 2015-10-03 DIAGNOSIS — R5383 Other fatigue: Secondary | ICD-10-CM | POA: Insufficient documentation

## 2015-10-09 ENCOUNTER — Other Ambulatory Visit (HOSPITAL_BASED_OUTPATIENT_CLINIC_OR_DEPARTMENT_OTHER): Payer: Self-pay

## 2015-10-09 DIAGNOSIS — G4733 Obstructive sleep apnea (adult) (pediatric): Secondary | ICD-10-CM

## 2015-10-14 DIAGNOSIS — G4733 Obstructive sleep apnea (adult) (pediatric): Secondary | ICD-10-CM

## 2015-10-14 NOTE — Procedures (Signed)
  Patient Name: Andrea Burns, Teanna Study Date: 10/03/2015 Gender: Female D.O.B: [birth year] Age (years): 30 Referring Provider: Norva RiffleAshley Vanstory Height (inches): 62 Interpreting Physician: Jetty Duhamellinton Young MD, ABSM Weight (lbs): 164 RPSGT: Wylie HailDavis, Rico BMI: 30 MRN: [id number] Neck Size: 15.00 CLINICAL INFORMATION Sleep Study Type: NPSG Indication for sleep study: Fatigue Epworth Sleepiness Score: 5  SLEEP STUDY TECHNIQUE As per the AASM Manual for the Scoring of Sleep and Associated Events v2.3 (April 2016) with a hypopnea requiring 4% desaturations. The channels recorded and monitored were frontal, central and occipital EEG, electrooculogram (EOG), submentalis EMG (chin), nasal and oral airflow, thoracic and abdominal wall motion, anterior tibialis EMG, snore microphone, electrocardiogram, and pulse oximetry.  MEDICATIONS Patient's medications include: charted for review Medications self-administered by patient during sleep study : No sleep medicine administered.  SLEEP ARCHITECTURE The study was initiated at 11:22:54 PM and ended at 5:25:45 AM. Sleep onset time was 10.9 minutes and the sleep efficiency was 89.2%. The total sleep time was 323.5 minutes. Stage REM latency was 61.5 minutes. The patient spent 3.09% of the night in stage N1 sleep, 59.04% in stage N2 sleep, 6.80% in stage N3 and 31.07% in REM. Alpha intrusion was absent. Supine sleep was 34.94%.  RESPIRATORY PARAMETERS The overall apnea/hypopnea index (AHI) was 0.6 per hour. There were 2 total apneas, including 0 obstructive, 2 central and 0 mixed apneas. There were 1 hypopneas and 0 RERAs. The AHI during Stage REM sleep was 1.8 per hour. AHI while supine was 0.0 per hour. The mean oxygen saturation was 97.85%. The minimum SpO2 during sleep was 94.00%. Loud snoring was noted during this study.  CARDIAC DATA The 2 lead EKG demonstrated sinus rhythm. The mean heart rate was 73.16 beats per minute. Other EKG findings  include: None.  LEG MOVEMENT DATA The total PLMS were 68 with a resulting PLMS index of 12.61. Associated arousal with leg movement index was 5.2 .  IMPRESSIONS - No significant obstructive sleep apnea occurred during this study (AHI = 0.6/h). - Study type converted to NPSG due to insufficient events for split CPAP protocol. - No significant central sleep apnea occurred during this study (CAI = 0.4/h). - Mild oxygen desaturation was noted during this study (Min O2 = 94.00%). - The patient snored with Loud snoring volume. - No cardiac abnormalities were noted during this study. - Mild periodic limb movements of sleep occurred during the study. Associated arousals were significant.  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 - Sleep hygiene should be reviewed to assess factors that may improve sleep quality. - Weight management and regular exercise should be initiated or continued if appropriate.  Waymon BudgeYOUNG,CLINTON D Diplomate, American Board of Sleep Medicine  ELECTRONICALLY SIGNED ON:  10/14/2015, 10:36 AM [facility] PH: [phone]   FX: [phone] ACCREDITED BY THE AMERICAN ACADEMY OF SLEEP MEDICINE

## 2016-02-03 ENCOUNTER — Encounter (HOSPITAL_COMMUNITY): Payer: Self-pay | Admitting: *Deleted

## 2016-02-03 ENCOUNTER — Emergency Department (HOSPITAL_COMMUNITY)
Admission: EM | Admit: 2016-02-03 | Discharge: 2016-02-03 | Disposition: A | Discharge status: Home / Self Care | Payer: Medicaid Other | Attending: Emergency Medicine | Admitting: Emergency Medicine

## 2016-02-03 DIAGNOSIS — M25512 Pain in left shoulder: Secondary | ICD-10-CM | POA: Diagnosis not present

## 2016-02-03 DIAGNOSIS — Z87891 Personal history of nicotine dependence: Secondary | ICD-10-CM | POA: Insufficient documentation

## 2016-02-03 MED ORDER — NAPROXEN 500 MG PO TABS: 500.0000 mg | ORAL_TABLET | Freq: Two times a day (BID) | ORAL | 0 refills | Status: DC

## 2016-02-03 NOTE — ED Triage Notes (Signed)
Pt reports Lt side of neck into LT arm hurts. Pain started this AM. 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

## 2016-02-03 NOTE — ED Provider Notes (Signed)
[facility]-EMERGENCY DEPT Provider Note   CSN: [id number] Arrival date & time: 02/03/16  0730     History   Chief Complaint Chief Complaint  Patient presents with  . Neck Pain    HPI Andrea Burns is a 30 y.o. female.  HPI  30 year old female presents today with left shoulder pain. 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 She denies any significant injury or heavy lifting at work, reports that when she was taking off her shirt she started to have pain in her left shoulder. She reports pain continued to progress over the night, with severe pain this morning. Patient reports she had difficulty taking off her shirt. She reports pain is most severe with trying to lift the arm up in the 4 direction. She notes tenderness to palpation of the posterior shoulder and left posterior lateral neck She denies any fever or chills, nausea or vomiting, chest pain or shortness of breath, dizziness, loss of distal sensation strength or motor function.  Past Medical History:  Diagnosis Date  . Anxiety   . Depression   . HA (headache)   . Medical history non-contributory     Patient Active Problem List   Diagnosis Date Noted  . Restless leg 12/24/2013  . HA (headache)   . Urinary tract infection, site not specified 11/08/2013  . Migraine headache with aura 11/08/2013  . Abnormal uterine bleeding (AUB) 11/08/2013  . Gastroenteritis, acute 11/08/2013  . Postpartum depression 08/12/2013  . Depressive disorder 08/12/2013  . Active labor 05/10/2013  . Normal delivery 05/10/2013  . Rapid first stage of labor 05/10/2013  . Abnormal genetic test in pregnancy 12/14/2012  . Papanicolaou smear of cervix with low grade squamous intraepithelial lesion (LGSIL) 10/19/2012  . Asymptomatic bacteriuria in pregnancy 09/21/2012  . 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 Supervision of other normal pregnancy 09/17/2012    Past Surgical History:  Procedure Laterality Date  . COLPOSCOPY  W/ BIOPSY / CURETTAGE    . IUD REMOVAL    . NO PAST SURGERIES      OB History    Gravida Para Term Preterm AB Living   5 4 4   1 4    SAB TAB Ectopic Multiple Live Births   1       4       Home Medications    Prior to Admission medications   Medication Sig Start Date End Date Taking? Authorizing Provider  etonogestrel (NEXPLANON) 68 MG IMPL implant 1 each by Subdermal route once.    Historical Provider, MD  fluconazole (DIFLUCAN) 150 MG tablet Take 1 tablet (150 mg total) by mouth once. 09/07/15   Brock Bad, MD  naproxen (NAPROSYN) 500 MG tablet Take 1 tablet (500 mg total) by mouth 2 (two) times daily. 02/03/16   Eyvonne Mechanic, PA-C  rizatriptan (MAXALT-MLT) 5 MG disintegrating tablet Take 1 tablet (5 mg total) by mouth as needed. May repeat in 2 hours if needed Patient not taking: Reported on 05/10/2015 01/25/15   Levert Feinstein, MD  topiramate (TOPAMAX) 25 MG tablet Take 25 mg by mouth 2 (two) times daily.    Historical Provider, MD    Family History Family History  Problem Relation Age of Onset  . Diabetes Mother   . Hypertension Mother   . Cancer Mother   . Diabetes Father   . Cancer Paternal Grandmother     liver & lung    Social History Social History  Substance  Use Topics  . Smoking status: Former Smoker    Packs/day: 0.25    Years: 1.00    Quit date: 05/03/2005  . Smokeless tobacco: Never Used  . Alcohol use No     Allergies   Review of patient's allergies indicates no known allergies.   Review of Systems Review of Systems  All other systems reviewed and are negative.    Physical Exam Updated Vital Signs BP 117/72 (BP Location: Right Arm)   Pulse 77   Temp 98.3 F (36.8 C) (Oral)   Resp 18   Ht 5\' 2"  (1.575 m)   Wt 81.2 kg   SpO2 100%   BMI 32.74 kg/m   Physical Exam  Constitutional: She is oriented to person, place, and time. She appears well-developed and well-nourished.  HENT:  Head: Normocephalic and atraumatic.  Eyes:  Conjunctivae are normal. Pupils are equal, round, and reactive to light. Right eye exhibits no discharge. Left eye exhibits no discharge. No scleral icterus.  Neck: Normal range of motion. No JVD present. No tracheal deviation present.  Pulmonary/Chest: Effort normal. No stridor.  Musculoskeletal:  Shoulder equal bilateral, no obvious swelling or deformity. Tenderness to palpation of the posterior scapular region, and posterior shoulder. Pain with forward flexion, no pain with internal rotation, pain with external rotation, radial pulses 2+, Refill less than 3 seconds, his grip strength 5 out of 5. Chest nontender, lung expansion normal.  Neurological: She is alert and oriented to person, place, and time. Coordination normal.  Psychiatric: She has a normal mood and affect. Her behavior is normal. Judgment and thought content normal.  Nursing note and vitals reviewed.    ED Treatments / Results  Labs (all labs ordered are listed, but only abnormal results are displayed) Labs Reviewed - No data to display  EKG  EKG Interpretation None       Radiology No results found.  Procedures Procedures (including critical care time)  Medications Ordered in ED Medications - No data to display   Initial Impression / Assessment and Plan / ED Course  I have reviewed the triage vital signs and the nursing notes.  Pertinent labs & imaging results that were available during my care of the patient were reviewed by me and considered in my medical decision making (see chart for details).  Clinical Course     Final Clinical Impressions(s) / ED Diagnoses   Final diagnoses:  Left shoulder pain    Labs:  Imaging:  Consults:  Therapeutics:  Discharge Meds:   Assessment/Plan:30 year old female presents today with likely muscular strain. Question rotator cuff involvement, no signs of infectious etiology on exam, no strength or neuro deficits. Patient given symptomatic care structures  orthopedic follow-up information, and strict return precautions. She verbalized understanding and agreement today's plan had no further questions or concerns at time of discharge.    New Prescriptions Discharge Medication List as of 02/03/2016  9:14 AM       Eyvonne MechanicJeffrey Lonia Roane, PA-C 02/03/16 1031    Doug SouSam Jacubowitz, MD 02/03/16 [phone]

## 2016-02-03 NOTE — Discharge Instructions (Signed)
Please read attached information. If you experience any new or worsening signs or symptoms please return to the emergency room for evaluation. Please follow-up with your primary care provider or specialist as discussed. Please use medication prescribed only as directed and discontinue taking if you have any concerning signs or symptoms.   °

## 2016-02-08 ENCOUNTER — Encounter: Payer: Self-pay | Admitting: Orthopaedic Surgery

## 2016-02-08 ENCOUNTER — Telehealth: Payer: Self-pay | Admitting: Orthopaedic Surgery

## 2016-02-08 ENCOUNTER — Ambulatory Visit: Payer: Medicaid Other | Admitting: Orthopaedic Surgery

## 2016-02-08 ENCOUNTER — Ambulatory Visit (INDEPENDENT_AMBULATORY_CARE_PROVIDER_SITE_OTHER): Payer: Medicaid Other

## 2016-02-08 VITALS — BP 122/83 | HR 50 | Temp 97.5°F | Ht 63.0 in | Wt 177.0 lb

## 2016-02-08 DIAGNOSIS — M25512 Pain in left shoulder: Secondary | ICD-10-CM

## 2016-02-08 MED ORDER — HYDROCODONE-ACETAMINOPHEN 5-325 MG PO TABS: 1.0000 | ORAL_TABLET | ORAL | 0 refills | Status: DC | PRN

## 2016-02-08 NOTE — Telephone Encounter (Signed)
Patient came in this morning to see Dr. Hilda LiasKeeling.  After seeing the doctor and upon checkout, she stated that her arm injury should be filed through [employer].  She did not tell me this as she checked in   She does not know any WC information or whether or not they actually will approve her problem.  She works for [employer] [place]  She said she just turned in a claim yesterday.  She is going to speak with them and see if [facility] as of yet.  I asked her to have her [facility] company call our office with their name, address, phone number, fax number, claim number and adjustor information also.  She said she would check into this

## 2016-02-08 NOTE — Progress Notes (Signed)
Subjective: I hurt my shoulder at work    Patient ID: Andrea RubensteinLatosha A Gagan, female    DOB: May 01, 1986, 30 y.o.   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  Later in the evening as she changed her shirt, she noticed more pain in the shoulder.  She reported this at work. She was [facility]03-16 around 7:30 that AM.  They evaluated her and gave her a sling and Naprosyn.  She is no better.  She has pain with attempting overhead use of the left shoulder.  She has no redness, no swelling, no numbness. She has taken the Naprosyn with little help. She has used ice and the sling.  She has no other injury, no other joint problem.   Review of Systems  HENT: Negative for congestion.   Respiratory: Negative for cough and shortness of breath.   Cardiovascular: Negative for chest pain and leg swelling.  Endocrine: Negative for cold intolerance.  Musculoskeletal: Positive for arthralgias.  Allergic/Immunologic: Positive for environmental allergies.  Neurological: Positive for headaches.  Psychiatric/Behavioral: The patient is nervous/anxious.    Past Medical History:  Diagnosis Date  . Anxiety   . Depression   . HA (headache)   . Medical history non-contributory     Past Surgical History:  Procedure Laterality Date  . COLPOSCOPY W/ BIOPSY / CURETTAGE    . IUD REMOVAL    . NO PAST SURGERIES      Current Outpatient Prescriptions on File Prior to Visit  Medication Sig Dispense Refill  . etonogestrel (NEXPLANON) 68 MG IMPL implant 1 each by Subdermal route once.    . fluconazole (DIFLUCAN) 150 MG tablet Take 1 tablet (150 mg total) by mouth once. 1 tablet 2  . naproxen (NAPROSYN) 500 MG tablet Take 1 tablet (500 mg total) by mouth 2 (two) times daily. 30 tablet 0  . rizatriptan (MAXALT-MLT) 5 MG disintegrating  tablet Take 1 tablet (5 mg total) by mouth as needed. May repeat in 2 hours if needed 15 tablet 6  . topiramate (TOPAMAX) 25 MG tablet Take 25 mg by mouth 2 (two) times daily.     Current Facility-Administered Medications on File Prior to Visit  Medication Dose Route Frequency Provider Last Rate Last Dose  . etonogestrel (IMPLANON) implant 68 mg  68 mg Subcutaneous Once Amy Tillie RungH Wren, CNM        Social History   Social History  . Marital status: Divorced    Spouse name: N/A  . Number of children: N/A  . Years of education: N/A   Occupational History  . Not on file.   Social History Main Topics  . Smoking status: Former Smoker    Packs/day: 0.25    Years: 1.00    Quit date: 05/03/2005  . Smokeless tobacco: Never Used  . Alcohol use No  . Drug use: No  . Sexual activity: Yes    Partners: Male    Birth control/ protection: Implant   Other Topics Concern  . Not on file   Social History Narrative  . No narrative on file    Family History  Problem Relation Age of Onset  . Diabetes Mother   . Hypertension Mother   . Cancer Mother   . Diabetes Father   .  Cancer Paternal Grandmother     liver & lung    BP 122/83   Pulse (!) 50   Temp 97.5 F (36.4 C)   Ht 5\' 3"  (1.6 m)   Wt 177 lb (80.3 kg)   BMI 31.35 kg/m       Objective:   Physical Exam  Constitutional: She is oriented to person, place, and time. She appears well-developed and well-nourished.  HENT:  Head: Normocephalic and atraumatic.  Eyes: Conjunctivae and EOM are normal. Pupils are equal, round, and reactive to light.  Neck: Normal range of motion. Neck supple.  Cardiovascular: Normal rate, regular rhythm and intact distal pulses.   Pulmonary/Chest: Effort normal.  Abdominal: Soft.  Musculoskeletal: She exhibits tenderness (Left shoulder in sling, tender to any motion, NV intact.  Elbow and left hand negative.  Neck negative.  Right upper extremity negative.).  Neurological: She is alert and oriented  to person, place, and time. She displays normal reflexes. No cranial nerve deficit. She exhibits normal muscle tone. Coordination normal.  Skin: Skin is warm and dry.  Psychiatric: She has a normal mood and affect. Her behavior is normal. Judgment and thought content normal.    X-rays were done of the left shoulder, reported separately.      Assessment & Plan:   Encounter Diagnosis  Name Primary?  . Left shoulder pain Yes   I will give note to work limited duty with no use of left upper extremity, wear sling.  I will order OT for the left shoulder.  Rx for pain medicine given.  Continue the Naprosyn.  Return in two weeks.  Continue the sling.  Call if any problem.  Precautions discussed.  Electronically Signed Darreld Mclean, MD 8/31/20171:46 PM

## 2016-02-08 NOTE — Patient Instructions (Signed)
Note given for light duty work.

## 2016-02-22 ENCOUNTER — Emergency Department (HOSPITAL_COMMUNITY): Payer: Self-pay

## 2016-02-22 ENCOUNTER — Telehealth: Payer: Self-pay | Admitting: Orthopaedic Surgery

## 2016-02-22 ENCOUNTER — Emergency Department (HOSPITAL_COMMUNITY)
Admission: EM | Admit: 2016-02-22 | Discharge: 2016-02-22 | Disposition: A | Discharge status: Home / Self Care | Payer: Self-pay | Attending: Emergency Medicine | Admitting: Emergency Medicine

## 2016-02-22 ENCOUNTER — Encounter (HOSPITAL_COMMUNITY): Payer: Self-pay

## 2016-02-22 DIAGNOSIS — S161XXA Strain of muscle, fascia and tendon at neck level, initial encounter: Secondary | ICD-10-CM | POA: Insufficient documentation

## 2016-02-22 DIAGNOSIS — S39012A Strain of muscle, fascia and tendon of lower back, initial encounter: Secondary | ICD-10-CM | POA: Insufficient documentation

## 2016-02-22 DIAGNOSIS — Z79899 Other long term (current) drug therapy: Secondary | ICD-10-CM | POA: Insufficient documentation

## 2016-02-22 DIAGNOSIS — M62830 Muscle spasm of back: Secondary | ICD-10-CM | POA: Insufficient documentation

## 2016-02-22 DIAGNOSIS — Y999 Unspecified external cause status: Secondary | ICD-10-CM | POA: Insufficient documentation

## 2016-02-22 DIAGNOSIS — R0789 Other chest pain: Secondary | ICD-10-CM | POA: Insufficient documentation

## 2016-02-22 DIAGNOSIS — Y939 Activity, unspecified: Secondary | ICD-10-CM | POA: Insufficient documentation

## 2016-02-22 DIAGNOSIS — Z87891 Personal history of nicotine dependence: Secondary | ICD-10-CM | POA: Insufficient documentation

## 2016-02-22 DIAGNOSIS — Y92410 Unspecified street and highway as the place of occurrence of the external cause: Secondary | ICD-10-CM | POA: Insufficient documentation

## 2016-02-22 LAB — I-STAT BETA HCG BLOOD, ED (MC, WL, AP ONLY): I-stat hCG, quantitative: <5 m[IU]/mL (ref <5)

## 2016-02-22 MED ORDER — CYCLOBENZAPRINE HCL 10 MG PO TABS: 10.0000 mg | ORAL_TABLET | Freq: Three times a day (TID) | ORAL | 0 refills | Status: DC | PRN

## 2016-02-22 MED ORDER — ONDANSETRON 4 MG PO TBDP
4.0000 mg | ORAL_TABLET | Freq: Once | ORAL | Status: AC
  Administered 2016-02-22: 4 mg via ORAL
  Filled 2016-02-22: qty 1

## 2016-02-22 MED ORDER — IBUPROFEN 800 MG PO TABS
800.0000 mg | ORAL_TABLET | Freq: Once | ORAL | Status: AC
  Administered 2016-02-22: 800 mg via ORAL
  Filled 2016-02-22: qty 1

## 2016-02-22 MED ORDER — CYCLOBENZAPRINE HCL 10 MG PO TABS
5.0000 mg | ORAL_TABLET | Freq: Once | ORAL | Status: AC
  Administered 2016-02-22: 5 mg via ORAL
  Filled 2016-02-22: qty 1

## 2016-02-22 MED ORDER — NAPROXEN 500 MG PO TABS: 500.0000 mg | ORAL_TABLET | Freq: Two times a day (BID) | ORAL | 0 refills | Status: DC | PRN
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
## 2016-02-22 NOTE — Discharge Instructions (Signed)
Take naprosyn as directed for inflammation and pain with tylenol for breakthrough pain and flexeril for muscle relaxation. Do not drive or operate machinery with muscle relaxant use. Ice to areas of soreness for the next 24 hours and then may move to heat, no more than 20 minutes at a time every hour for each. Expect to be sore for the next few days and follow up with primary care physician for recheck of ongoing symptoms in the next 1-2 weeks. Always wear your seatbelt. Return to ER for emergent changing or worsening of symptoms.

## 2016-02-22 NOTE — Telephone Encounter (Signed)
Called and spoke with Huntley DecSara at Physical Therapy to relay message from Dr. Hilda LiasKeeling. She is going to call patient and let her know about going to her family doctor asap to be sure she is not pregnant. I asked her to let the patient know that I will be calling her to set up a follow up appt with us.

## 2016-02-22 NOTE — Telephone Encounter (Signed)
Huntley DecSara from Physical Therapy downstairs left voicemail message that she needs to speak with you in reference to this patient. She was approved by [employer] for PT. The patient told her of some symptoms that she thought you should be aware of including Wt gain 10 lbs in 2 wks, Nausea, Cold Sweats, Numbness on left side of face, double vision, shortness of breath and kind of feels like she is pregnant. Also no follow up appointment was set up due to pending [employer] authorization. Do we need to call and schedule. PT # [phone]

## 2016-02-22 NOTE — Telephone Encounter (Signed)
Set up return in two weeks.  Suggest she see family doctor asap.  That will not be covered by W/C.  See if she is pregnant.

## 2016-02-28 ENCOUNTER — Encounter: Payer: Self-pay | Admitting: Orthopaedic Surgery

## 2016-02-28 ENCOUNTER — Ambulatory Visit (INDEPENDENT_AMBULATORY_CARE_PROVIDER_SITE_OTHER): Payer: Medicaid Other | Admitting: Orthopaedic Surgery

## 2016-02-28 VITALS — BP 124/80 | HR 75 | Temp 97.9°F | Ht 63.0 in | Wt 180.0 lb

## 2016-02-28 DIAGNOSIS — M25512 Pain in left shoulder: Secondary | ICD-10-CM

## 2016-02-28 NOTE — Progress Notes (Signed)
Patient ZO:XWRUEAV Andrea Burns, female DOB:04-02-1986, 30 y.o. WUJ:[id number]  Chief Complaint  Patient presents with  . Follow-up    left shoulder pain    HPI  Andrea Burns is a 30 y.o. female who has left shoulder pain.  She has been to OT and is significantly improved. She has no pain and full motion now.  She has no paresthesias. HPI  Body mass index is 31.89 kg/m.  ROS  Review of Systems  HENT: Negative for congestion.   Respiratory: Negative for cough and shortness of breath.   Cardiovascular: Negative for chest pain and leg swelling.  Endocrine: Negative for cold intolerance.  Musculoskeletal: Positive for arthralgias.  Allergic/Immunologic: Positive for environmental allergies.  Neurological: Positive for headaches.  Psychiatric/Behavioral: The patient is nervous/anxious.     Past Medical History:  Diagnosis Date  . Anxiety   . Depression   . HA (headache)   . Medical history non-contributory     Past Surgical History:  Procedure Laterality Date  . COLPOSCOPY W/ BIOPSY / CURETTAGE    . IUD REMOVAL    . NO PAST SURGERIES      Family History  Problem Relation Age of Onset  . Diabetes Mother   . Hypertension Mother   . Cancer Mother   . Diabetes Father   . Cancer Paternal Grandmother     liver & lung    Social History Social History  Substance Use Topics  . Smoking status: Former Smoker    Packs/day: 0.25    Years: 1.00    Quit date: 05/03/2005  . Smokeless tobacco: Never Used  . Alcohol use No    No Known Allergies  Current Outpatient Prescriptions  Medication Sig Dispense Refill  . cyclobenzaprine (FLEXERIL) 10 MG tablet Take 1 tablet (10 mg total) by mouth 3 (three) times daily as needed for muscle spasms. 15 tablet 0  . escitalopram (LEXAPRO) 5 MG tablet Take 5 mg by mouth daily.    Marland Kitchen HYDROcodone-acetaminophen (NORCO/VICODIN) 5-325 MG tablet Take 1 tablet by mouth every 4 (four) hours as needed for moderate pain (Must last 14 days.Do  not take and drive a car or use machinery.). 56 tablet 0  . naproxen (NAPROSYN) 500 MG tablet Take 1 tablet (500 mg total) by mouth 2 (two) times daily. (Patient not taking: Reported on 02/22/2016) 30 tablet 0  . naproxen (NAPROSYN) 500 MG tablet Take 1 tablet (500 mg total) by mouth 2 (two) times daily as needed for mild pain, moderate pain or headache (TAKE WITH MEALS.). 20 tablet 0  . PAZEO 0.7 % SOLN Place 1 drop into both eyes daily.  3  . RESTASIS MULTIDOSE 0.05 % ophthalmic emulsion Place 1 drop into both eyes daily.  3   Current Facility-Administered Medications  Medication Dose Route Frequency Provider Last Rate Last Dose  . etonogestrel (IMPLANON) implant 68 mg  68 mg Subcutaneous Once Amy Tillie Rung, CNM         Physical Exam  Blood pressure 124/80, pulse 75, temperature 97.9 F (36.6 C), height 5\' 3"  (1.6 m), weight 180 lb (81.6 kg).  Constitutional: overall normal hygiene, normal nutrition, well developed, normal grooming, normal body habitus. Assistive device:none  Musculoskeletal: gait and station Limp none, muscle tone and strength are normal, no tremors or atrophy is present.  .  Neurological: coordination overall normal.  Deep tendon reflex/nerve stretch intact.  Sensation normal.  Cranial nerves II-XII intact.   Skin:   Normal overall no scars, lesions, ulcers or  rashes. No psoriasis.  Psychiatric: Alert and oriented x 3.  Recent memory intact, remote memory unclear.  Normal mood and affect. Well groomed.  Good eye contact.  Cardiovascular: overall no swelling, no varicosities, no edema bilaterally, normal temperatures of the legs and arms, no clubbing, cyanosis and good capillary refill.  Lymphatic: palpation is normal.  Examination of left Upper Extremity is done.  Inspection:   Overall:  Elbow non-tender without crepitus or defects, forearm non-tender without crepitus or defects, wrist non-tender without crepitus or defects, hand non-tender.    Shoulder: without  glenohumeral joint tenderness, without effusion.   Upper arm: without swelling and tenderness   Range of motion:   Overall:  Full range of motion of the elbow, full range of motion of wrist and full range of motion in fingers.   Shoulder:  left  full degrees forward flexion; full degrees abduction; full degrees internal rotation, full degrees external rotation, full degrees extension, full degrees adduction.   Stability:   Overall:  Shoulder, elbow and wrist stable   Strength and Tone:   Overall full shoulder muscles strength, full upper arm strength and normal upper arm bulk and tone.   The patient has been educated about the nature of the problem(s) and counseled on treatment options.  The patient appeared to understand what I have discussed and is in agreement with it.  Encounter Diagnosis  Name Primary?  . Left shoulder pain Yes    PLAN Call if any problems.  Precautions discussed.  Continue current medications.   Return to clinic 1 month   Electronically Signed Darreld McleanWayne Janasia Coverdale, MD 9/20/201710:09 AM

## 2016-02-28 NOTE — Patient Instructions (Signed)
Return to work full duty 

## 2016-03-04 ENCOUNTER — Telehealth: Payer: Self-pay | Admitting: Orthopaedic Surgery
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
## 2016-03-17 ENCOUNTER — Encounter (HOSPITAL_COMMUNITY): Payer: Self-pay

## 2016-03-17 ENCOUNTER — Emergency Department (HOSPITAL_COMMUNITY)
Admission: EM | Admit: 2016-03-17 | Discharge: 2016-03-17 | Disposition: A | Discharge status: Home / Self Care | Payer: Medicaid Other | Attending: Emergency Medicine | Admitting: Emergency Medicine

## 2016-03-17 DIAGNOSIS — Z87891 Personal history of nicotine dependence: Secondary | ICD-10-CM | POA: Insufficient documentation

## 2016-03-17 DIAGNOSIS — R109 Unspecified abdominal pain: Secondary | ICD-10-CM

## 2016-03-17 DIAGNOSIS — R1012 Left upper quadrant pain: Secondary | ICD-10-CM | POA: Insufficient documentation

## 2016-03-17 DIAGNOSIS — R601 Generalized edema: Secondary | ICD-10-CM | POA: Insufficient documentation

## 2016-03-17 DIAGNOSIS — R1032 Left lower quadrant pain: Secondary | ICD-10-CM | POA: Insufficient documentation

## 2016-03-17 LAB — COMPREHENSIVE METABOLIC PANEL
ALT: 25 U/L (ref 14–54)
AST: 25 U/L (ref 15–41)
Albumin: 4 g/dL (ref 3.5–5.0)
Alkaline Phosphatase: 50 U/L (ref 38–126)
Anion gap: 6 (ref 5–15)
BUN: 10 mg/dL (ref 6–20)
CO2: 24 mmol/L (ref 22–32)
Calcium: 9.4 mg/dL (ref 8.9–10.3)
Chloride: 108 mmol/L (ref 101–111)
Creatinine, Ser: 0.67 mg/dL (ref 0.44–1.00)
GFR calc Af Amer: >60 mL/min (ref >60)
GFR calc non Af Amer: >60 mL/min (ref >60)
Glucose, Bld: 81 mg/dL (ref 65–99)
Potassium: 3.8 mmol/L (ref 3.5–5.1)
Sodium: 138 mmol/L (ref 135–145)
Total Bilirubin: 0.4 mg/dL (ref 0.3–1.2)
Total Protein: 6.8 g/dL (ref 6.5–8.1)

## 2016-03-17 LAB — WET PREP, GENITAL
Clue Cells Wet Prep HPF POC: NONE SEEN
Sperm: NONE SEEN
Trich, Wet Prep: NONE SEEN
Yeast Wet Prep HPF POC: NONE SEEN

## 2016-03-17 LAB — CBC
HCT: 41.3 % (ref 36.0–46.0)
Hemoglobin: 14 g/dL (ref 12.0–15.0)
MCH: 29.6 pg (ref 26.0–34.0)
MCHC: 33.9 g/dL (ref 30.0–36.0)
MCV: 87.3 fL (ref 78.0–100.0)
Platelets: 268 10*3/uL (ref 150–400)
RBC: 4.73 MIL/uL (ref 3.87–5.11)
RDW: 12.3 % (ref 11.5–15.5)
WBC: 7.9 10*3/uL (ref 4.0–10.5)

## 2016-03-17 LAB — URINALYSIS, ROUTINE W REFLEX MICROSCOPIC
Bilirubin Urine: NEGATIVE
Glucose, UA: NEGATIVE mg/dL
Hgb urine dipstick: NEGATIVE
Ketones, ur: NEGATIVE mg/dL
Leukocytes, UA: NEGATIVE
Nitrite: NEGATIVE
Protein, ur: NEGATIVE mg/dL
Specific Gravity, Urine: 1.02 (ref 1.005–1.030)
pH: 5.5 (ref 5.0–8.0)

## 2016-03-17 LAB — POC URINE PREG, ED: Preg Test, Ur: NEGATIVE

## 2016-03-17 LAB — LIPASE, BLOOD: Lipase: 45 U/L (ref 11–51)

## 2016-03-17 MED ORDER — STERILE WATER FOR INJECTION IJ SOLN
INTRAMUSCULAR | Status: AC
  Administered 2016-03-17: 10 mL
  Filled 2016-03-17: qty 10

## 2016-03-17 MED ORDER — AZITHROMYCIN 250 MG PO TABS
1000.0000 mg | ORAL_TABLET | Freq: Once | ORAL | Status: AC
  Administered 2016-03-17: 1000 mg via ORAL
  Filled 2016-03-17: qty 4

## 2016-03-17 MED ORDER — CEFTRIAXONE SODIUM 250 MG IJ SOLR
250.0000 mg | Freq: Once | INTRAMUSCULAR | Status: AC
  Administered 2016-03-17: 250 mg via INTRAMUSCULAR
  Filled 2016-03-17: qty 250

## 2016-03-17 NOTE — ED Triage Notes (Signed)
Patient here with complaint of generalized edema x 2 days, states that her ankles are swelling and she has noticed facial fulness. NAD. No medical hx, no shortness of breath

## 2016-03-17 NOTE — ED Triage Notes (Signed)
PT now reported to RN LLQ pain  .

## 2016-03-17 NOTE — ED Notes (Signed)
Declined W/C at D/C and was escorted to lobby by RN. 

## 2016-03-17 NOTE — ED Provider Notes (Signed)
[facility]-EMERGENCY DEPT Provider Note   CSN: [id number] Arrival date & time: 03/17/16  1020  By signing my name below, I, Andrea Burns, attest that this documentation has been prepared under the direction and in the presence of Audry Piliyler Fenix Ruppe PA-C.  Electronically Signed: Vista Minkobert Burns, ED Scribe. 03/17/16. 10:57 AM.   History   Chief Complaint No chief complaint on file.   HPI HPI Comments: Andrea Burns is a 30 y.o. female with a Hx of anxiety, who presents to the Emergency Department complaining of generalized swelling "all over" onset two days ago. Pt states she has noticed that multiple areas of her body have been swelling including her face, arms, and ankles. She also complains of a burning sensation to her bilateral ankles during ambulation. She also notes a mild headache currently and states she has become very anxious today over these symptoms. Pt also complains of lower abdominal pain and LUQ/LLQ abdominal pain that started yesterday. Pt states that she feels "heavy" and was told by a nurse at her other job that she was retaining fluids. She has not taken any PO OTC medications. No current daily medications. No current PCP. Pt denies any nausea, vomiting, cough, fever or congestion. Normal BMs. No vaginal bleeding. No discharge. No other symptoms noted.    The history is provided by the patient. No language interpreter was used.   Past Medical History:  Diagnosis Date  . Anxiety   . Depression   . HA (headache)   . Medical history non-contributory     Patient Active Problem List   Diagnosis Date Noted  . Restless leg 12/24/2013  . HA (headache)   . Urinary tract infection, site not specified 11/08/2013  . Migraine headache with aura 11/08/2013  . Abnormal uterine bleeding (AUB) 11/08/2013  . Gastroenteritis, acute 11/08/2013  . Postpartum depression 08/12/2013  . Depressive disorder 08/12/2013  . Active labor 05/10/2013  . Normal delivery 05/10/2013  . Rapid first stage of  labor 05/10/2013  . Abnormal genetic test in pregnancy 12/14/2012  . Papanicolaou smear of cervix with low grade squamous intraepithelial lesion (LGSIL) 10/19/2012  . Asymptomatic bacteriuria in pregnancy 09/21/2012  . 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 Supervision of other normal pregnancy 09/17/2012    Past Surgical History:  Procedure Laterality Date  . COLPOSCOPY W/ BIOPSY / CURETTAGE    . IUD REMOVAL    . NO PAST SURGERIES      OB History    Gravida Para Term Preterm AB Living   5 4 4   1 4    SAB TAB Ectopic Multiple Live Births   1       4       Home Medications    Prior to Admission medications   Medication Sig Start Date End Date Taking? Authorizing Provider  cyclobenzaprine (FLEXERIL) 10 MG tablet Take 1 tablet (10 mg total) by mouth 3 (three) times daily as needed for muscle spasms. 02/22/16   Mercedes Camprubi-Soms, PA-C  escitalopram (LEXAPRO) 5 MG tablet Take 5 mg by mouth daily.    Historical Provider, MD  HYDROcodone-acetaminophen (NORCO/VICODIN) 5-325 MG tablet Take 1 tablet by mouth every 4 (four) hours as needed for moderate pain (Must last 14 days.Do not take and drive a car or use machinery.). 02/08/16   Darreld McleanWayne Keeling, MD  naproxen (NAPROSYN) 500 MG tablet Take 1 tablet (500 mg total) by mouth 2 (two) times daily. Patient not taking: Reported on 02/22/2016 02/03/16   Eyvonne MechanicJeffrey Hedges, PA-C  naproxen (NAPROSYN)  500 MG tablet Take 1 tablet (500 mg total) by mouth 2 (two) times daily as needed for mild pain, moderate pain or headache (TAKE WITH MEALS.). 02/22/16   Mercedes Camprubi-Soms, PA-C  PAZEO 0.7 % SOLN Place 1 drop into both eyes daily. 01/01/16   Historical Provider, MD  RESTASIS MULTIDOSE 0.05 % ophthalmic emulsion Place 1 drop into both eyes daily. 01/01/16   Historical Provider, MD    Family History Family History  Problem Relation Age of Onset  . Diabetes Mother   . Hypertension Mother   . Cancer Mother   . Diabetes Father   . Cancer Paternal Grandmother      liver & lung    Social History Social History  Substance Use Topics  . Smoking status: Former Smoker    Packs/day: 0.25    Years: 1.00    Quit date: 05/03/2005  . Smokeless tobacco: Never Used  . Alcohol use No   Allergies   Review of patient's allergies indicates no known allergies.   Review of Systems Review of Systems  All other systems reviewed and are negative.  A complete 10 system review of systems was obtained and all systems are negative except as noted in the HPI and PMH.   Physical Exam Updated Vital Signs BP 121/76 (BP Location: Left Arm)   Pulse 72   Temp 97.7 F (36.5 C) (Oral)   Resp 18   SpO2 100%   Physical Exam  Constitutional: She is oriented to person, place, and time. She appears well-developed and well-nourished. No distress.  HENT:  Head: Normocephalic and atraumatic.  Eyes: EOM are normal. Pupils are equal, round, and reactive to light.  Neck: Normal range of motion.  Cardiovascular: Normal rate, regular rhythm, normal heart sounds, intact distal pulses and normal pulses.   Pulmonary/Chest: Effort normal and breath sounds normal. She has no decreased breath sounds.  Abdominal: Soft. Normal appearance and bowel sounds are normal. There is no tenderness. There is no rigidity, no rebound, no guarding, no CVA tenderness, no tenderness at McBurney's point and negative Murphy's sign.  Musculoskeletal: Normal range of motion.  No appreciable swelling noted on BUE/BLE. No pitting edema. NVI. Distal pulses appreciated x 4  Neurological: She is alert and oriented to person, place, and time.  Skin: Skin is warm and dry. She is not diaphoretic.  Psychiatric: She has a normal mood and affect. Judgment normal.  Nursing note and vitals reviewed.  Exam performed by Eston Esters,  exam chaperoned Date: 03/17/2016 Pelvic exam: normal external genitalia without evidence of trauma. VULVA: normal appearing vulva with no masses, tenderness or lesion. VAGINA:  normal appearing vagina with normal color and discharge, no lesions. CERVIX: normal appearing cervix without lesions, cervical motion tenderness absent, cervical os closed with out purulent discharge; vaginal discharge - creamy and thick, Wet prep and DNA probe for chlamydia and GC obtained.   ADNEXA: normal adnexa in size, nontender and no masses UTERUS: uterus is normal size, shape, consistency and nontender.   ED Treatments / Results  DIAGNOSTIC STUDIES: Oxygen Saturation is 100% on RA, normal by my interpretation.  COORDINATION OF CARE: 10:57 AM-Will order blood work. Discussed treatment plan with pt at bedside and pt agreed to plan.   Labs (all labs ordered are listed, but only abnormal results are displayed) Labs Reviewed  WET PREP, GENITAL  CBC  COMPREHENSIVE METABOLIC PANEL  LIPASE, BLOOD  URINALYSIS, ROUTINE W REFLEX MICROSCOPIC (NOT AT [facility])  POC URINE PREG, ED  GC/CHLAMYDIA  PROBE AMP ([place]) NOT AT [facility]   EKG  EKG Interpretation None      Radiology No results found.  Procedures Procedures (including critical care time)  Medications Ordered in ED Medications - No data to display   Initial Impression / Assessment and Plan / ED Course  I have reviewed the triage vital signs and the nursing notes.  Pertinent labs & imaging results that were available during my care of the patient were reviewed by me and considered in my medical decision making (see chart for details).  Clinical Course   Final Clinical Impressions(s) / ED Diagnoses  I have reviewed and evaluated the relevant laboratory values I have reviewed the relevant previous healthcare records. I obtained HPI from historian.  ED Course:  Assessment: Patient is a 30yF presents with generalized swelling and LUQ/LLQ abdominal pain x 2 days. No sig PMH. On exam, nontoxic, nonseptic appearing, in no apparent distress. Patient's pain and other symptoms adequately managed in emergency department. Labs and  vitals reviewed. CBC/ CMP unremarkable. Lipase unremarkble. Patient does not meet the SIRS or Sepsis criteria.  On repeat exam patient does not have a surgical abdomen and there are no peritoneal signs.  No indication of appendicitis, bowel obstruction, bowel perforation, cholecystitis, diverticulitis, PID or ectopic pregnancy. Pelvic exam unremarkable. No CMT. No Adnexal tenderness. Wet Prep showed some white cells. Given Azithro/Rocephin. GC Obtained. Patient discharged home with symptomatic treatment and given strict instructions for follow-up with their primary care physician.  I have also discussed reasons to return immediately to the ER.  Patient expresses understanding and agrees with plan.  Disposition/Plan:  DC Home Additional Verbal discharge instructions given and discussed with patient.  Pt Instructed to f/u with PCP in the next week for evaluation and treatment of symptoms. Return precautions given Pt acknowledges and agrees with plan  Supervising Physician Raeford Razor, MD   Final diagnoses:  Abdominal pain, unspecified abdominal location    New Prescriptions New Prescriptions   No medications on file   I personally performed the services described in this documentation, which was scribed in my presence. The recorded information has been reviewed and is accurate.     Audry Pili, PA-C 03/17/16 1249    Raeford Razor, MD 03/19/16 [phone]

## 2016-03-17 NOTE — Discharge Instructions (Signed)
Please read and follow all provided instructions.  Your diagnoses today include:  1. Abdominal pain, unspecified abdominal location    Tests performed today include: Blood counts and electrolytes Blood tests to check liver and kidney function Blood tests to check pancreas function Urine test to look for infection and pregnancy (in women) Vital signs. See below for your results today.   Medications prescribed:   Take any prescribed medications only as directed.  Home care instructions:  Follow any educational materials contained in this packet.  Follow-up instructions: Please follow-up with your primary care provider in the next 2 days for further evaluation of your symptoms.    Return instructions:  SEEK IMMEDIATE MEDICAL ATTENTION IF: The pain does not go away or becomes severe  A temperature above 101F develops  Repeated vomiting occurs (multiple episodes)  The pain becomes localized to portions of the abdomen. The right side could possibly be appendicitis. In an adult, the left lower portion of the abdomen could be colitis or diverticulitis.  Blood is being passed in stools or vomit (bright red or black tarry stools)  You develop chest pain, difficulty breathing, dizziness or fainting, or become confused, poorly responsive, or inconsolable (young children) If you have any other emergent concerns regarding your health  Additional Information: Abdominal (belly) pain can be caused by many things. Your caregiver performed an examination and possibly ordered blood/urine tests and imaging (CT scan, x-rays, ultrasound). Many cases can be observed and treated at home after initial evaluation in the emergency department. Even though you are being discharged home, abdominal pain can be unpredictable. Therefore, you need a repeated exam if your pain does not resolve, returns, or worsens. Most patients with abdominal pain don't have to be admitted to the hospital or have surgery, but serious  problems like appendicitis and gallbladder attacks can start out as nonspecific pain. Many abdominal conditions cannot be diagnosed in one visit, so follow-up evaluations are very important.  Your vital signs today were: BP 121/76 (BP Location: Left Arm)    Pulse 72    Temp 97.7 F (36.5 C) (Oral)    Resp 18    SpO2 100%  If your blood pressure (bp) was elevated above 135/85 this visit, please have this repeated by your doctor within one month. --------------

## 2016-03-17 NOTE — ED Triage Notes (Signed)
PT reported to PA she has LUQ pain.

## 2016-03-18 LAB — GC/CHLAMYDIA PROBE AMP (~~LOC~~) NOT AT ARMC
Chlamydia: NEGATIVE
Neisseria Gonorrhea: NEGATIVE

## 2016-03-27 ENCOUNTER — Ambulatory Visit: Payer: Self-pay | Admitting: Orthopaedic Surgery

## 2016-03-28 ENCOUNTER — Encounter: Payer: Self-pay | Admitting: Orthopaedic Surgery

## 2016-05-09 ENCOUNTER — Ambulatory Visit: Payer: Self-pay | Admitting: Obstetrics

## 2016-05-22 ENCOUNTER — Ambulatory Visit (INDEPENDENT_AMBULATORY_CARE_PROVIDER_SITE_OTHER): Payer: Medicaid Other | Admitting: Obstetrics

## 2016-05-22 ENCOUNTER — Encounter: Payer: Self-pay | Admitting: Obstetrics

## 2016-05-22 VITALS — BP 122/83 | HR 80 | Temp 97.8°F | Wt 185.2 lb

## 2016-05-22 DIAGNOSIS — Z3046 Encounter for surveillance of implantable subdermal contraceptive: Secondary | ICD-10-CM

## 2016-05-22 DIAGNOSIS — Z3049 Encounter for surveillance of other contraceptives: Secondary | ICD-10-CM

## 2016-05-22 DIAGNOSIS — Z30011 Encounter for initial prescription of contraceptive pills: Secondary | ICD-10-CM

## 2016-05-22 DIAGNOSIS — Z3009 Encounter for other general counseling and advice on contraception: Secondary | ICD-10-CM

## 2016-05-22 DIAGNOSIS — R309 Painful micturition, unspecified: Secondary | ICD-10-CM

## 2016-05-22 MED ORDER — LO LOESTRIN FE 1 MG-10 MCG / 10 MCG PO TABS: 1.0000 | ORAL_TABLET | Freq: Every day | ORAL | 4 refills | Status: DC

## 2016-05-22 NOTE — Progress Notes (Signed)

## 2016-05-22 NOTE — Addendum Note (Signed)
Addended by: Francene FindersJAMES, QUINETTA C on: 05/22/2016 04:42 PM   Modules accepted: Orders

## 2016-05-24 LAB — URINE CULTURE

## 2016-06-12 ENCOUNTER — Encounter: Payer: Self-pay | Admitting: Obstetrics

## 2016-06-12 ENCOUNTER — Ambulatory Visit: Payer: Medicaid Other | Admitting: Obstetrics

## 2016-06-12 VITALS — BP 121/86 | HR 73 | Wt 187.0 lb

## 2016-06-12 DIAGNOSIS — Z3040 Encounter for surveillance of contraceptives, unspecified: Secondary | ICD-10-CM

## 2016-06-12 NOTE — Progress Notes (Signed)
Subjective:    Andrea Burns is a 31 y.o. female who presents for 2 week check up after Nexplanon Removal. The patient has no complaints today. The patient is sexually active. Pertinent past medical history: none.  The information documented in the HPI was reviewed and verified.  Menstrual History: OB History    Gravida Para Term Preterm AB Living   5 4 4   1 4    SAB TAB Ectopic Multiple Live Births   1       4       Patient's last menstrual period was 06/03/2016.   Patient Active Problem List   Diagnosis Date Noted  . Restless leg 12/24/2013  . HA (headache)   . Urinary tract infection, site not specified 11/08/2013  . Migraine headache with aura 11/08/2013  . Abnormal uterine bleeding (AUB) 11/08/2013  . Gastroenteritis, acute 11/08/2013  . Postpartum depression 08/12/2013  . Depressive disorder 08/12/2013  . Active labor 05/10/2013  . Normal delivery 05/10/2013  . Rapid first stage of labor 05/10/2013  . Abnormal genetic test in pregnancy 12/14/2012  . Papanicolaou smear of cervix with low grade squamous intraepithelial lesion (LGSIL) 10/19/2012  . Asymptomatic bacteriuria in pregnancy 09/21/2012  . 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 Supervision of other normal pregnancy 09/17/2012   Past Medical History:  Diagnosis Date  . Anxiety   . Depression   . HA (headache)   . Medical history non-contributory     Past Surgical History:  Procedure Laterality Date  . COLPOSCOPY W/ BIOPSY / CURETTAGE    . IUD REMOVAL    . NO PAST SURGERIES       Current Outpatient Prescriptions:  .  escitalopram (LEXAPRO) 5 MG tablet, Take 5 mg by mouth daily., Disp: , Rfl:  .  LO LOESTRIN FE 1 MG-10 MCG / 10 MCG tablet, Take 1 tablet by mouth daily. Start taking pills on 1st day of period., Disp: 3 Package, Rfl: 4 .  PAZEO 0.7 % SOLN, Place 1 drop into both eyes daily., Disp: , Rfl: 3 .  RESTASIS MULTIDOSE 0.05 % ophthalmic emulsion, Place 1 drop into both eyes daily., Disp: , Rfl: 3 No  Known Allergies  Social History  Substance Use Topics  . Smoking status: Former Smoker    Packs/day: 0.25    Years: 1.00    Quit date: 05/03/2005  . Smokeless tobacco: Never Used  . Alcohol use No    Family History  Problem Relation Age of Onset  . Diabetes Mother   . Hypertension Mother   . Cancer Mother   . Diabetes Father   . Cancer Paternal Grandmother     liver & lung       Review of Systems Constitutional: negative for weight loss Genitourinary:negative for abnormal menstrual periods and vaginal discharge   Objective:   BP 121/86   Pulse 73   Wt 187 lb (84.8 kg)   LMP 06/03/2016 Comment: patient is still using OCP  BMI 33.13 kg/m    General:   alert  Skin:   no rash or abnormalities  Lungs:   clear to auscultation bilaterally  Heart:   regular rate and rhythm, S1, S2 normal, no murmur, click, rub or gallop  Breasts:   normal without suspicious masses, skin or nipple changes or axillary nodes  Abdomen:  normal findings: no organomegaly, soft, non-tender and no hernia  Pelvis:  External genitalia: normal general appearance Urinary system: urethral meatus normal and bladder without fullness, nontender Vaginal: normal  without tenderness, induration or masses Cervix: normal appearance Adnexa: normal bimanual exam Uterus: anteverted and non-tender, normal size   Lab Review Urine pregnancy test Labs reviewed yes Radiologic studies reviewed no  50% of 15 min visit spent on counseling and coordination of care.    Assessment:    31 y.o., discontinuing Nexplanon and starting OCP's, no contraindications.   Plan:    All questions answered. Contraception: OCP (estrogen/progesterone). Discussed healthy lifestyle modifications. Follow up in 3 months.  Annual and Pap Smear  No orders of the defined types were placed in this encounter.  No orders of the defined types were placed in this encounter.  Need to obtain previous records Follow up as needed.   Patient ID: Andrea Burns, female   DOB: 24-May-1986, 31 y.o.   MRN: [id number]

## 2016-06-12 NOTE — Progress Notes (Signed)
Patient reports she still has stitch in place.

## 2016-07-13 ENCOUNTER — Ambulatory Visit (INDEPENDENT_AMBULATORY_CARE_PROVIDER_SITE_OTHER): Payer: Self-pay

## 2016-07-13 ENCOUNTER — Encounter (HOSPITAL_COMMUNITY): Payer: Self-pay | Admitting: *Deleted

## 2016-07-13 ENCOUNTER — Ambulatory Visit (HOSPITAL_COMMUNITY)
Admission: EM | Admit: 2016-07-13 | Discharge: 2016-07-13 | Disposition: A | Discharge status: Home / Self Care | Payer: Medicaid Other | Attending: Family Medicine | Admitting: Family Medicine

## 2016-07-13 DIAGNOSIS — J181 Lobar pneumonia, unspecified organism: Secondary | ICD-10-CM

## 2016-07-13 DIAGNOSIS — J189 Pneumonia, unspecified organism: Secondary | ICD-10-CM

## 2016-07-13 DIAGNOSIS — R69 Illness, unspecified: Secondary | ICD-10-CM

## 2016-07-13 DIAGNOSIS — J111 Influenza due to unidentified influenza virus with other respiratory manifestations: Secondary | ICD-10-CM

## 2016-07-13 DIAGNOSIS — R05 Cough: Secondary | ICD-10-CM | POA: Insufficient documentation

## 2016-07-13 DIAGNOSIS — R509 Fever, unspecified: Secondary | ICD-10-CM | POA: Insufficient documentation

## 2016-07-13 LAB — POCT RAPID STREP A: Streptococcus, Group A Screen (Direct): NEGATIVE

## 2016-07-13 MED ORDER — ALBUTEROL SULFATE HFA 108 (90 BASE) MCG/ACT IN AERS: 2.0000 | INHALATION_SPRAY | Freq: Four times a day (QID) | RESPIRATORY_TRACT | 2 refills | Status: DC | PRN

## 2016-07-13 MED ORDER — IPRATROPIUM-ALBUTEROL 0.5-2.5 (3) MG/3ML IN SOLN
3.0000 mL | Freq: Once | RESPIRATORY_TRACT | Status: AC
  Administered 2016-07-13: 3 mL via RESPIRATORY_TRACT

## 2016-07-13 MED ORDER — IPRATROPIUM-ALBUTEROL 0.5-2.5 (3) MG/3ML IN SOLN
RESPIRATORY_TRACT | Status: AC
  Filled 2016-07-13: qty 3

## 2016-07-13 MED ORDER — OSELTAMIVIR PHOSPHATE 75 MG PO CAPS: 75.0000 mg | ORAL_CAPSULE | Freq: Two times a day (BID) | ORAL | 0 refills | Status: AC

## 2016-07-13 MED ORDER — AZITHROMYCIN 250 MG PO TABS: 250.0000 mg | ORAL_TABLET | Freq: Every day | ORAL | 0 refills | Status: AC

## 2016-07-13 NOTE — ED Provider Notes (Signed)
CSN: [id number]     Arrival date & time 07/13/16  1638 History   First MD Initiated Contact with Patient 07/13/16 1801     Chief Complaint  Patient presents with  . Cough  . Fever   (Consider location/radiation/quality/duration/timing/severity/associated sxs/prior Treatment) Patient is a 31 year old female, is here for fever, body aches, chills, fatigue, coughing and trouble breathing. She also endorses chest pain during cough. She denies wheezing. Symptoms started all of a sudden last night. Fever highest is 102.6 at home. 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       Past Medical History:  Diagnosis Date  . Anxiety   . Depression   . HA (headache)   . Medical history non-contributory    Past Surgical History:  Procedure Laterality Date  . COLPOSCOPY W/ BIOPSY / CURETTAGE    . IUD REMOVAL    . NO PAST SURGERIES     Family History  Problem Relation Age of Onset  . Diabetes Mother   . Hypertension Mother   . Cancer Mother   . Diabetes Father   . Cancer Paternal Grandmother     liver & lung   Social History  Substance Use Topics  . Smoking status: Former Smoker    Packs/day: 0.25    Years: 1.00    Quit date: 05/03/2005  . Smokeless tobacco: Never Used  . Alcohol use No   OB History    Gravida Para Term Preterm AB Living   5 4 4   1 4    SAB TAB Ectopic Multiple Live Births   1       4     Review of Systems  Constitutional: Positive for chills, fatigue and fever.  HENT: Positive for congestion, ear pain, sinus pressure, sneezing and sore throat.   Respiratory: Positive for cough. Negative for shortness of breath and wheezing.   Cardiovascular: Positive for chest pain. Negative for palpitations.  Gastrointestinal: Positive for nausea. Negative for abdominal pain and vomiting.  Neurological: Positive for dizziness and headaches.    Allergies  Patient has no known  allergies.  Home Medications   Prior to Admission medications   Medication Sig Start Date End Date Taking? Authorizing Provider  albuterol (PROVENTIL HFA;VENTOLIN HFA) 108 (90 Base) MCG/ACT inhaler Inhale 2 puffs into the lungs every 6 (six) hours as needed for wheezing or shortness of breath. 07/13/16   Lucia Estelle, NP  azithromycin (ZITHROMAX) 250 MG tablet Take 1 tablet (250 mg total) by mouth daily. Take first 2 tablets together, then 1 every day until finished. 07/13/16 07/18/16  Lucia Estelle, NP  escitalopram (LEXAPRO) 5 MG tablet Take 5 mg by mouth daily.    Historical Provider, MD  LO LOESTRIN FE 1 MG-10 MCG / 10 MCG tablet Take 1 tablet by mouth daily. Start taking pills on 1st day of period. 05/22/16   Brock Bad, MD  oseltamivir (TAMIFLU) 75 MG capsule Take 1 capsule (75 mg total) by mouth every 12 (twelve) hours. 07/13/16 07/18/16  Lucia Estelle, NP  PAZEO 0.7 % SOLN Place 1 drop into both eyes daily. 01/01/16   Historical Provider, MD  RESTASIS MULTIDOSE 0.05 % ophthalmic emulsion Place 1 drop into both eyes daily. 01/01/16   Historical Provider, MD   Meds Ordered and Administered this Visit   Medications  ipratropium-albuterol (DUONEB) 0.5-2.5 (3) MG/3ML nebulizer solution 3 mL (3 mLs Nebulization Given 07/13/16 1832)  BP 124/70 (BP Location: Right Arm)   Pulse 112   Temp 100.3 F (37.9 C) (Oral)   Resp 18   LMP 06/12/2016 (Within Days)   SpO2 100%  No data found.   Physical Exam  Constitutional: She is oriented to person, place, and time. She appears well-developed and well-nourished.  HENT:  Head: Normocephalic.  Right Ear: External ear normal.  Left Ear: External ear normal.  Nose: Nose normal.  Mouth/Throat: Oropharynx is clear and moist. No oropharyngeal exudate.  Tonsils unremarkable.   Eyes: Conjunctivae are normal. Pupils are equal, round, and reactive to light.  Neck: Normal range of motion.  Cardiovascular: Normal rate, regular rhythm and normal heart sounds.    Pulmonary/Chest: Effort normal.  Lung sounds are diminished, no wheezing or crackles.   Abdominal: Soft. Bowel sounds are normal. She exhibits no distension. There is no tenderness.  Lymphadenopathy:    She has no cervical adenopathy.  Neurological: She is alert and oriented to person, place, and time.  Skin: Skin is warm and dry.  Psychiatric: She has a normal mood and affect.  Nursing note and vitals reviewed.   Urgent Care Course     Procedures (including critical care time)  Labs Review Labs Reviewed  POCT RAPID STREP A    Imaging Review Dg Chest 2 View  Result Date: 07/13/2016 CLINICAL DATA:  Cough, myalgias and fever.  Onset yesterday. EXAM: CHEST  2 VIEW COMPARISON:  02/22/2016 . FINDINGS: Mild linear opacities in the right middle lobe, without confluent consolidation. The left lung is clear. The pulmonary vasculature is normal. There is no pleural effusion. Hilar and mediastinal contours are unremarkable and unchanged. Heart size is normal. IMPRESSION: Right middle lobe linear opacities. Cannot exclude early infectious infiltrate although this could be atelectatic. Electronically Signed   By: Ellery Plunkaniel R Mitchell M.D.   On: 07/13/2016 18:39     MDM   1. Influenza-like illness   2. Community acquired pneumonia of right middle lobe of lung (HCC)    Rapid strep negative. Chest x-ray shows right middle lobe linear opacities and cannot rule out early infectious infiltrates. Will treat empirically with Z-Pak. Albuterol inhaler given as needed for wheezing. Also suspecting that patient has influenza. We'll treat empirically with Tamiflu twice a day 5 days. Patient informed that she must follow-up with the primary care doctor next week for reevaluation. Return precautions discussed.         Lucia EstelleFeng Damon Hargrove, NP 07/13/16 [phone]

## 2016-07-13 NOTE — ED Triage Notes (Signed)
Pt  Reports    Symptoms    Of  Body    Aches   Chills   And  Fever  With  Onset  Of  Symptoms   Starting  Yesterday   Pt     Is  Masked   And  Is  In  A  Private  Room    She  Is  Alert  And  Oriented

## 2016-07-16 LAB — CULTURE, GROUP A STREP (THRC)

## 2017-07-18 ENCOUNTER — Other Ambulatory Visit: Payer: Self-pay | Admitting: Obstetrics

## 2017-07-18 DIAGNOSIS — Z30011 Encounter for initial prescription of contraceptive pills: Secondary | ICD-10-CM

## 2017-07-18 DIAGNOSIS — Z3009 Encounter for other general counseling and advice on contraception: Secondary | ICD-10-CM

## 2017-07-29 ENCOUNTER — Encounter: Payer: Self-pay | Admitting: Obstetrics

## 2017-07-29 ENCOUNTER — Other Ambulatory Visit (HOSPITAL_COMMUNITY)
Admission: RE | Admit: 2017-07-29 | Discharge: 2017-07-29 | Disposition: A | Discharge status: Home / Self Care | Payer: Medicaid Other | Source: Ambulatory Visit | Attending: Obstetrics | Admitting: Obstetrics

## 2017-07-29 ENCOUNTER — Ambulatory Visit (INDEPENDENT_AMBULATORY_CARE_PROVIDER_SITE_OTHER): Payer: Medicaid Other | Admitting: Obstetrics

## 2017-07-29 VITALS — BP 134/85 | HR 78 | Ht 62.0 in | Wt 164.0 lb

## 2017-07-29 DIAGNOSIS — Z309 Encounter for contraceptive management, unspecified: Secondary | ICD-10-CM | POA: Diagnosis not present

## 2017-07-29 DIAGNOSIS — N393 Stress incontinence (female) (male): Secondary | ICD-10-CM

## 2017-07-29 DIAGNOSIS — Z01419 Encounter for gynecological examination (general) (routine) without abnormal findings: Secondary | ICD-10-CM

## 2017-07-29 DIAGNOSIS — Z3041 Encounter for surveillance of contraceptive pills: Secondary | ICD-10-CM

## 2017-07-29 DIAGNOSIS — N898 Other specified noninflammatory disorders of vagina: Secondary | ICD-10-CM

## 2017-07-29 DIAGNOSIS — N930 Postcoital and contact bleeding: Secondary | ICD-10-CM

## 2017-07-29 DIAGNOSIS — N3281 Overactive bladder: Secondary | ICD-10-CM

## 2017-07-29 DIAGNOSIS — N939 Abnormal uterine and vaginal bleeding, unspecified: Secondary | ICD-10-CM

## 2017-07-29 LAB — POCT URINALYSIS DIPSTICK
Bilirubin, UA: NEGATIVE
Glucose, UA: NEGATIVE
Ketones, UA: NEGATIVE
Leukocytes, UA: NEGATIVE
Nitrite, UA: NEGATIVE
Protein, UA: NEGATIVE
Spec Grav, UA: 1.015 (ref 1.010–1.025)
Urobilinogen, UA: 0.2 E.U./dL
pH, UA: 6 (ref 5.0–8.0)

## 2017-07-29 LAB — POCT URINE PREGNANCY: Preg Test, Ur: NEGATIVE

## 2017-07-29 MED ORDER — LEVONORGESTREL-ETHINYL ESTRAD 0.15-30 MG-MCG PO TABS
1.0000 | ORAL_TABLET | Freq: Every day | ORAL | 11 refills | Status: DC
Start: 2017-07-29 — End: 2018-08-03

## 2017-07-29 NOTE — Progress Notes (Signed)
Subjective:        Andrea Burns is a 32 y.o. female here for a routine exam.  Current complaints: No periods on OCP's.  Bleeding after intercourse.  Denies pelvic pain.    Personal health questionnaire:  Is patient [name], have a family history of breast and/or ovarian cancer: no Is there a family history of uterine cancer diagnosed at age < 5350, gastrointestinal cancer, urinary tract cancer, family member who is a Personnel officerLynch syndrome-associated carrier: no Is the patient overweight and hypertensive, family history of diabetes, personal history of gestational diabetes, preeclampsia or PCOS: no Is patient over 7255, have PCOS,  family history of premature CHD under age 31, diabetes, smoke, have hypertension or peripheral artery disease:  no At any time, has a partner hit, kicked or otherwise hurt or frightened you?: no Over the past 2 weeks, have you felt down, depressed or hopeless?: no Over the past 2 weeks, have you felt little interest or pleasure in doing things?:no   Gynecologic History No LMP recorded. Patient is not currently having periods (Reason: Oral contraceptives). Contraception: OCP (estrogen/progesterone) Last Pap: 2016. Results were: ASCUS with positive HPV Last mammogram: n/a. Results were: n/a  Obstetric History OB History  Gravida Para Term Preterm AB Living  5 4 4   1 4   SAB TAB Ectopic Multiple Live Births  1       4    # Outcome Date GA Lbr Len/2nd Weight Sex Delivery Anes PTL Lv  5 Term 05/10/13 4812w3d 35:30 / 00:36 6 lb 15.3 oz (3.155 kg) M Vag-Spont Local  LIV  4 Term 04/23/09 5087w0d  8 lb (3.629 kg) M Vag-Spont None  LIV     Birth Comments: No complications  3 Term 02/28/07 5887w0d  7 lb 5 oz (3.317 kg) M Vag-Spont None  LIV     Birth Comments: No complications  2 Term 05/16/04 5823w0d  7 lb 11 oz (3.487 kg) F Vag-Spont None  LIV     Birth Comments: No complications  1 SAB               Past Medical History:  Diagnosis Date  . Anxiety   .  Depression   . HA (headache)   . Medical history non-contributory     Past Surgical History:  Procedure Laterality Date  . COLPOSCOPY W/ BIOPSY / CURETTAGE    . IUD REMOVAL    . NO PAST SURGERIES       Current Outpatient Medications:  .  albuterol (PROVENTIL HFA;VENTOLIN HFA) 108 (90 Base) MCG/ACT inhaler, Inhale 2 puffs into the lungs every 6 (six) hours as needed for wheezing or shortness of breath., Disp: 1 Inhaler, Rfl: 2 .  escitalopram (LEXAPRO) 5 MG tablet, Take 5 mg by mouth daily., Disp: , Rfl:  .  LO LOESTRIN FE 1 MG-10 MCG / 10 MCG tablet, TAKE 1 TABLET BY MOUTH EVERY DAY. START ON FIRST DAY OF PERIOD, Disp: 84 tablet, Rfl: 3 .  PAZEO 0.7 % SOLN, Place 1 drop into both eyes daily., Disp: , Rfl: 3 .  RESTASIS MULTIDOSE 0.05 % ophthalmic emulsion, Place 1 drop into both eyes daily., Disp: , Rfl: 3 No Known Allergies  Social History   Tobacco Use  . Smoking status: Former Smoker    Packs/day: 0.25    Years: 1.00    Pack years: 0.25    Last attempt to quit: 05/03/2005    Years since quitting: 12.2  . Smokeless tobacco:  Never Used  Substance Use Topics  . Alcohol use: No    Alcohol/week: 0.0 oz    Family History  Problem Relation Age of Onset  . Diabetes Mother   . Hypertension Mother   . Cancer Mother   . Diabetes Father   . Cancer Paternal Grandmother        liver & lung      Review of Systems  Constitutional: negative for fatigue and weight loss Respiratory: negative for cough and wheezing Cardiovascular: negative for chest pain, fatigue and palpitations Gastrointestinal: negative for abdominal pain and change in bowel habits Musculoskeletal:negative for myalgias Neurological: negative for gait problems and tremors Behavioral/Psych: negative for abusive relationship, depression Endocrine: negative for temperature intolerance    Genitourinary:positive for abnormal menstrual periods and bleeding after intercourse.  Negative for pelvic pain, genital lesions,  hot flashes, sexual problems and vaginal discharge Integument/breast: negative for breast lump, breast tenderness, nipple discharge and skin lesion(s)    Objective:       BP 134/85   Pulse 78   Ht 5\' 2"  (1.575 m)   Wt 164 lb (74.4 kg)   BMI 30.00 kg/m  General:   alert  Skin:   no rash or abnormalities  Lungs:   clear to auscultation bilaterally  Heart:   regular rate and rhythm, S1, S2 normal, no murmur, click, rub or gallop  Breasts:   normal without suspicious masses, skin or nipple changes or axillary nodes  Abdomen:  normal findings: no organomegaly, soft, non-tender and no hernia  Pelvis:  External genitalia: normal general appearance Urinary system: urethral meatus normal and bladder without fullness, nontender Vaginal: normal without tenderness, induration or masses Cervix: normal appearance Adnexa: normal bimanual exam Uterus: anteverted and non-tender, normal size   Lab Review Urine pregnancy test Labs reviewed yes Radiologic studies reviewed yes  50% of 20 min visit spent on counseling and coordination of care.   Assessment:    Healthy female exam.    Plan:    Education reviewed: calcium supplements, depression evaluation, low fat, low cholesterol diet, safe sex/STD prevention, self breast exams and weight bearing exercise. Contraception: OCP (estrogen/progesterone). Follow up in: 3 months.   No orders of the defined types were placed in this encounter.  No orders of the defined types were placed in this encounter.  1. Encounter for routine gynecological examination with Papanicolaou smear of cervix Rx: - Cytology - PAP - POCT urine pregnancy  2. Encounter for surveillance of contraceptive pills Rx: - discontinue Lo Loestrin 24 - levonorgestrel-ethinyl estradiol (NORDETTE) 0.15-30 MG-MCG tablet; Take 1 tablet by mouth daily.  Dispense: 1 Package; Refill: 11  3. Postcoital bleeding - normal exam  4. Abnormal uterine bleeding (AUB) - probable low  dose OCP not strong enough to maintain cycle  5. Vaginal discharge Rx: - Cervicovaginal ancillary only  6. OAB (overactive bladder) Rx: - Urine Culture - POCT Urinalysis Dipstick  7. SUI (stress urinary incontinence, female) - Kegel's exercises recommended   Brock Bad MD

## 2017-07-29 NOTE — Progress Notes (Signed)
Patient is in the office for annual, complains of a lot of bleeding sometimes with intercourse, but pt states that she does not have a menstrual cycle, since her nexplanon was removed in Jan 2018. Pt is currently on BC pills. Last pap 09-21-14

## 2017-07-30 LAB — CERVICOVAGINAL ANCILLARY ONLY
Bacterial vaginitis: NEGATIVE
Candida vaginitis: NEGATIVE
Chlamydia: NEGATIVE
Neisseria Gonorrhea: NEGATIVE
Trichomonas: NEGATIVE

## 2017-07-31 LAB — URINE CULTURE

## 2017-08-05 LAB — CYTOLOGY - PAP
Diagnosis: NEGATIVE
HPV: NOT DETECTED

## 2018-01-24 ENCOUNTER — Emergency Department (HOSPITAL_COMMUNITY)
Admission: EM | Admit: 2018-01-24 | Discharge: 2018-01-24 | Disposition: A | Discharge status: Home / Self Care | Payer: Self-pay | Attending: Emergency Medicine | Admitting: Emergency Medicine

## 2018-01-24 ENCOUNTER — Emergency Department (HOSPITAL_COMMUNITY): Payer: Self-pay

## 2018-01-24 ENCOUNTER — Encounter (HOSPITAL_COMMUNITY): Payer: Self-pay

## 2018-01-24 DIAGNOSIS — R1013 Epigastric pain: Secondary | ICD-10-CM | POA: Insufficient documentation

## 2018-01-24 DIAGNOSIS — N12 Tubulo-interstitial nephritis, not specified as acute or chronic: Secondary | ICD-10-CM | POA: Insufficient documentation

## 2018-01-24 DIAGNOSIS — R0789 Other chest pain: Secondary | ICD-10-CM | POA: Insufficient documentation

## 2018-01-24 DIAGNOSIS — Z79899 Other long term (current) drug therapy: Secondary | ICD-10-CM | POA: Insufficient documentation

## 2018-01-24 DIAGNOSIS — Z87891 Personal history of nicotine dependence: Secondary | ICD-10-CM | POA: Insufficient documentation

## 2018-01-24 LAB — CBC WITH DIFFERENTIAL/PLATELET
Basophils Absolute: 0 10*3/uL (ref 0.0–0.1)
Basophils Relative: 0 %
Eosinophils Absolute: 0.2 10*3/uL (ref 0.0–0.7)
Eosinophils Relative: 1 %
HCT: 41.7 % (ref 36.0–46.0)
Hemoglobin: 14.3 g/dL (ref 12.0–15.0)
Lymphocytes Relative: 11 %
Lymphs Abs: 2.1 10*3/uL (ref 0.7–4.0)
MCH: 30.1 pg (ref 26.0–34.0)
MCHC: 34.3 g/dL (ref 30.0–36.0)
MCV: 87.8 fL (ref 78.0–100.0)
Monocytes Absolute: 1.2 10*3/uL — ABNORMAL HIGH (ref 0.1–1.0)
Monocytes Relative: 6 %
Neutro Abs: 15.7 10*3/uL — ABNORMAL HIGH (ref 1.7–7.7)
Neutrophils Relative %: 82 %
Platelets: 261 10*3/uL (ref 150–400)
RBC: 4.75 MIL/uL (ref 3.87–5.11)
RDW: 12.3 % (ref 11.5–15.5)
WBC: 19.2 10*3/uL — ABNORMAL HIGH (ref 4.0–10.5)

## 2018-01-24 LAB — COMPREHENSIVE METABOLIC PANEL
ALT: 23 U/L (ref 0–44)
AST: 25 U/L (ref 15–41)
Albumin: 3.8 g/dL (ref 3.5–5.0)
Alkaline Phosphatase: 45 U/L (ref 38–126)
Anion gap: 8 (ref 5–15)
BUN: 13 mg/dL (ref 6–20)
CO2: 25 mmol/L (ref 22–32)
Calcium: 9.1 mg/dL (ref 8.9–10.3)
Chloride: 105 mmol/L (ref 98–111)
Creatinine, Ser: 0.79 mg/dL (ref 0.44–1.00)
GFR calc Af Amer: >60 mL/min (ref >60)
GFR calc non Af Amer: >60 mL/min (ref >60)
Glucose, Bld: 97 mg/dL (ref 70–99)
Potassium: 3.6 mmol/L (ref 3.5–5.1)
Sodium: 138 mmol/L (ref 135–145)
Total Bilirubin: 0.5 mg/dL (ref 0.3–1.2)
Total Protein: 7.6 g/dL (ref 6.5–8.1)

## 2018-01-24 LAB — URINALYSIS, ROUTINE W REFLEX MICROSCOPIC
Bilirubin Urine: NEGATIVE
Glucose, UA: NEGATIVE mg/dL
Ketones, ur: NEGATIVE mg/dL
Nitrite: POSITIVE — AB
Protein, ur: 100 mg/dL — AB
Specific Gravity, Urine: 1.027 (ref 1.005–1.030)
WBC, UA: >50 WBC/hpf — ABNORMAL HIGH (ref 0–5)
pH: 5 (ref 5.0–8.0)

## 2018-01-24 LAB — PREGNANCY, URINE: Preg Test, Ur: NEGATIVE

## 2018-01-24 LAB — TROPONIN I: Troponin I: <0.03 ng/mL (ref <0.03)

## 2018-01-24 LAB — LIPASE, BLOOD: Lipase: 37 U/L (ref 11–51)

## 2018-01-24 MED ORDER — SODIUM CHLORIDE 0.9 % IV SOLN
1.0000 g | Freq: Once | INTRAVENOUS | Status: AC
  Administered 2018-01-24: 1 g via INTRAVENOUS
  Filled 2018-01-24: qty 10

## 2018-01-24 MED ORDER — CEPHALEXIN 500 MG PO CAPS: 500.0000 mg | ORAL_CAPSULE | Freq: Four times a day (QID) | ORAL | 0 refills | Status: DC

## 2018-01-24 MED ORDER — ONDANSETRON HCL 4 MG/2ML IJ SOLN
4.0000 mg | Freq: Once | INTRAMUSCULAR | Status: AC
  Administered 2018-01-24: 4 mg via INTRAVENOUS
  Filled 2018-01-24: qty 2

## 2018-01-24 MED ORDER — GI COCKTAIL ~~LOC~~
30.0000 mL | Freq: Once | ORAL | Status: AC
  Administered 2018-01-24: 30 mL via ORAL
  Filled 2018-01-24: qty 30

## 2018-01-24 MED ORDER — ONDANSETRON 4 MG PO TBDP: 4.0000 mg | ORAL_TABLET | Freq: Three times a day (TID) | ORAL | 0 refills | Status: DC | PRN

## 2018-01-24 NOTE — Discharge Instructions (Addendum)
Take antibiotics as prescribed for a kidney infection.  Follow-up with your doctor.  Return to the ED with worsening symptoms including fever, persistent vomiting, worsening pain, inability to urinate or any other concerns.

## 2018-01-24 NOTE — ED Notes (Signed)
Pt tolerating fluids well at this time. Need more urine for a urine culture before d/c. Will notify staff when one can be obtained.

## 2018-01-24 NOTE — ED Notes (Signed)
ED Provider at bedside. 

## 2018-01-24 NOTE — ED Triage Notes (Signed)
Pt reports vomiting, chest pain, abd pain (pelvic) and uti symptoms.  Pt reports vomiting got worse since 3 am.  Pt taking Azo for uti symptoms

## 2018-01-24 NOTE — ED Provider Notes (Signed)
[facility]GENCY DEPART[facility] Provider Note   CSN: [id number] Arrival date & time: 01/24/18  0535     History   Chief Complaint Chief Complaint  Patient presents with  . Multiple complaints    HPI Andrea Burns is a 32 y.o. female.   Patient states she has had UTI symptoms for the past several days for which she has been taking Azo.  She woke up with nausea and vomiting since 3 AM. states she is vomited about 6 or 7 times.  No diarrhea no fever.  Still has burning after urination with frequency and urgency. States she has had some tightness in her chest which she believes is from vomiting.  Admits to a lot of stress at home and using some cocaine yesterday.  Has upper abdominal pain as well as diffuse abdominal pain everywhere in her abdomen and low back.  Denies any vaginal bleeding or discharge.  States she believes she has a UTI again and has had vomiting this morning which she believes is also causing her chest pain.      The history is provided by the patient.    Past Medical History:  Diagnosis Date  . Anxiety   . Depression   . HA (headache)   . Medical history non-contributory     Patient Active Problem List   Diagnosis Date Noted  . Restless leg 12/24/2013  . HA (headache)   . Urinary tract infection, site not specified 11/08/2013  . Migraine headache with aura 11/08/2013  . Abnormal uterine bleeding (AUB) 11/08/2013  . Gastroenteritis, acute 11/08/2013  . Postpartum depression 08/12/2013  . Depressive disorder 08/12/2013  . Active labor 05/10/2013  . Normal delivery 05/10/2013  . Rapid first stage of labor 05/10/2013  . Abnormal genetic test in pregnancy 12/14/2012  . Papanicolaou smear of cervix with low grade squamous intraepithelial lesion (LGSIL) 10/19/2012  . Asymptomatic bacteriuria in pregnancy 09/21/2012  . 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 Supervision of other normal pregnancy 09/17/2012    Past Surgical History:  Procedure Laterality  Date  . COLPOSCOPY W/ BIOPSY / CURETTAGE    . IUD REMOVAL    . NO PAST SURGERIES       OB History    Gravida  5   Para  4   Term  4   Preterm      AB  1   Living  4     SAB  1   TAB      Ectopic      Multiple      Live Births  4            Home Medications    Prior to Admission medications   Medication Sig Start Date End Date Taking? Authorizing Provider  albuterol (PROVENTIL HFA;VENTOLIN HFA) 108 (90 Base) MCG/ACT inhaler Inhale 2 puffs into the lungs every 6 (six) hours as needed for wheezing or shortness of breath. Patient not taking: Reported on 07/29/2017 07/13/16   Lucia EstelleZheng, Feng, NP  escitalopram (LEXAPRO) 5 MG tablet Take 5 mg by mouth daily.    [provider]  levonorgestrel-ethinyl estradiol (NORDETTE) 0.15-30 MG-MCG tablet Take 1 tablet by mouth daily. 07/29/17   Brock BadHarper, Charles A, MD  PAZEO 0.7 % SOLN Place 1 drop into both eyes daily. 01/01/16   [provider]  phentermine 15 MG capsule Take 20.5 mg by mouth every morning.    [provider]  RESTASIS MULTIDOSE 0.05 % ophthalmic emulsion Place 1 drop into  both eyes daily. 01/01/16   [provider]    Family History Family History  Problem Relation Age of Onset  . Diabetes Mother   . Hypertension Mother   . Cancer Mother   . Diabetes Father   . Cancer Paternal Grandmother        liver & lung    Social History Social History   Tobacco Use  . Smoking status: Former Smoker    Packs/day: 0.25    Years: 1.00    Pack years: 0.25    Last attempt to quit: 05/03/2005    Years since quitting: 12.7  . Smokeless tobacco: Never Used  Substance Use Topics  . Alcohol use: No    Alcohol/week: 0.0 standard drinks  . Drug use: Yes    Types: Cocaine     Allergies   Patient has no known allergies.   Review of Systems Review of Systems  Constitutional: Positive for activity change, appetite change and fatigue. Negative for fever.  HENT: Negative for congestion  and rhinorrhea.   Eyes: Negative for visual disturbance.  Respiratory: Positive for chest tightness.   Gastrointestinal: Positive for abdominal pain, diarrhea, nausea and vomiting.  Genitourinary: Positive for dysuria, flank pain, frequency and urgency. Negative for vaginal bleeding and vaginal discharge.  Musculoskeletal: Positive for arthralgias and myalgias. Negative for back pain.  Neurological: Positive for weakness. Negative for headaches.    all other systems are negative except as noted in the HPI and PMH.   Physical Exam Updated Vital Signs BP (!) 147/108 (BP Location: Left Arm)   Pulse 94   Temp 97.8 F (36.6 C) (Oral)   Resp 17   Ht 5\' 3"  (1.6 m)   Wt 83.5 kg   SpO2 98%   BMI 32.59 kg/m   Physical Exam  Constitutional: She is oriented to person, place, and time. She appears well-developed and well-nourished. No distress.  HENT:  Head: Normocephalic and atraumatic.  Mouth/Throat: Oropharynx is clear and moist. No oropharyngeal exudate.  Eyes: Pupils are equal, round, and reactive to light. Conjunctivae and EOM are normal.  Neck: Normal range of motion. Neck supple.  No meningismus.  Cardiovascular: Normal rate, regular rhythm, normal heart sounds and intact distal pulses.  No murmur heard. Pulmonary/Chest: Effort normal and breath sounds normal. No respiratory distress. She exhibits no tenderness.  Abdominal: Soft. There is tenderness. There is no rebound and no guarding.  Epigastric tenderness, no guarding or rebound, abdomen soft  Musculoskeletal: Normal range of motion. She exhibits tenderness. She exhibits no edema.  Paraspinal tenderness bilaterally, no midline tenderness  Neurological: She is alert and oriented to person, place, and time. No cranial nerve deficit. She exhibits normal muscle tone. Coordination normal.  No ataxia on finger to nose bilaterally. No pronator drift. 5/5 strength throughout. CN 2-12 intact.Equal grip strength. Sensation intact.     Skin: Skin is warm.  Psychiatric: She has a normal mood and affect. Her behavior is normal.  Nursing note and vitals reviewed.    ED Treatments / Results  Labs (all labs ordered are listed, but only abnormal results are displayed) Labs Reviewed  URINALYSIS, ROUTINE W REFLEX MICROSCOPIC - Abnormal; Notable for the following components:      Result Value   Color, Urine AMBER (*)    APPearance HAZY (*)    Hgb urine dipstick MODERATE (*)    Protein, ur 100 (*)    Nitrite POSITIVE (*)    Leukocytes, UA TRACE (*)    WBC, UA >  50 (*)    Bacteria, UA RARE (*)    Non Squamous Epithelial 0-5 (*)    All other components within normal limits  CBC WITH DIFFERENTIAL/PLATELET - Abnormal; Notable for the following components:   WBC 19.2 (*)    Neutro Abs 15.7 (*)    Monocytes Absolute 1.2 (*)    All other components within normal limits  URINE CULTURE  PREGNANCY, URINE  COMPREHENSIVE METABOLIC PANEL  LIPASE, BLOOD  TROPONIN I    EKG EKG Interpretation  Date/Time:  Saturday January 24 2018 05:51:31 EDT Ventricular Rate:  87 PR Interval:    QRS Duration: 85 QT Interval:  346 QTC Calculation: 417 R Axis:   22 Text Interpretation:  Sinus rhythm Borderline T wave abnormalities No previous ECGs available Confirmed by Glynn Octaveancour, Arnitra Sokoloski [phone]([id number]) on 01/24/2018 6:10:10 AM   Radiology Dg Chest 2 View  Result Date: 01/24/2018 CLINICAL DATA:  Vomiting, chest pain, abdominal pain, and urinary tract infection symptoms. EXAM: CHEST - 2 VIEW COMPARISON:  07/13/2016 FINDINGS: Normal heart size and pulmonary vascularity. No focal airspace disease or consolidation in the lungs. No blunting of costophrenic angles. No pneumothorax. Mediastinal contours appear intact. IMPRESSION: No active cardiopulmonary disease. Electronically Signed   By: Burman NievesWilliam  Stevens M.D.   On: 01/24/2018 06:40   Ct Renal Stone Study  Result Date: 01/24/2018 CLINICAL DATA:  Vomiting and chest pain with abdominal pain and UTI  symptoms since this morning. EXAM: CT ABDOMEN AND PELVIS WITHOUT CONTRAST TECHNIQUE: Multidetector CT imaging of the abdomen and pelvis was performed following the standard protocol without IV contrast. COMPARISON:  None. FINDINGS: Lower chest: No acute abnormality. Hepatobiliary: Liver, gallbladder and biliary tree are within normal. Pancreas: Normal. Spleen: Normal. Adrenals/Urinary Tract: Adrenal glands are normal. Kidneys are normal in size without hydronephrosis or nephrolithiasis. Ureters and bladder are normal. Stomach/Bowel: Stomach and small bowel are within normal. The appendix is normal and retrocecal in location. Colon is normal Vascular/Lymphatic: Normal. Reproductive: Normal. Other: No free fluid or focal inflammatory change. Musculoskeletal: Normal. IMPRESSION: No acute findings in the abdomen/pelvis. Electronically Signed   By: Elberta Fortisaniel  Boyle M.D.   On: 01/24/2018 07:27    Procedures Procedures (including critical care time)  Medications Ordered in ED Medications  ondansetron (ZOFRAN) injection 4 mg (has no administration in time range)  gi cocktail (Maalox,Lidocaine,Donnatal) (has no administration in time range)  cefTRIAXone (ROCEPHIN) 1 g in sodium chloride 0.9 % 100 mL IVPB (has no administration in time range)     Initial Impression / Assessment and Plan / ED Course  I have reviewed the triage vital signs and the nursing notes.  Pertinent labs & imaging results that were available during my care of the patient were reviewed by me and considered in my medical decision making (see chart for details).    UTI symptoms for several days now with abdominal pain and flank pain.  No fever.  Does have chest pain today after vomiting.  EKG is nonischemic.  Urinalysis consistent with infection, culture sent and Rocephin started.  Troponin negative.  Patient admits to using cocaine about 9 PM yesterday.  Single troponin adequate at this point as her cocaine use was 9 hours ago  We  will treat for pyelonephritis with IV antibiotics.  Low suspicion for ACS, pulmonary embolism, aortic dissection.  Chest x-ray is negative.  CT scan does not show any ureteral stone.  Anticipate discharge home after patient is able to tolerate p.o.  Will treat for pyelonephritis.  Return precautions discussed.  Final Clinical Impressions(s) / ED Diagnoses   Final diagnoses:  Pyelonephritis  Atypical chest pain    ED Discharge Orders         Ordered    cephALEXin (KEFLEX) 500 MG capsule  4 times daily     01/24/18 0747    ondansetron (ZOFRAN ODT) 4 MG disintegrating tablet  Every 8 hours PRN     01/24/18 0747           S[place] Moise, Jeannett Senior, MD 01/24/18 [phone]

## 2018-01-24 NOTE — ED Provider Notes (Deleted)
MSE was initiated and I personally evaluated the patient and placed orders (if any) at  6:39 AM on January 24, 2018.  Patient states she is had UTI symptoms for the past several days which she has been taking Azo.  She woke up with nausea and vomiting since 3 AM. states she is vomited about 6 or 7 times.  No diarrhea no fever.  Still has burning after urination with frequency and urgency. States she has had some tightness in her chest which she believes is from vomiting.  Admits to a lot of stress at home and using some cocaine yesterday.  Moist mucous membranes.  No distress.  Rate and rhythm. Paraspinal lumbar tenderness bilaterally, abdomen soft and nontender.  We will check labs, urinalysis and EKG.  The patient appears stable so that the remainder of the MSE may be completed by another provider.   Glynn Octaveancour, Khadar Monger, MD 01/24/18 [phone]

## 2018-01-25 LAB — URINE CULTURE: Culture: NO GROWTH

## 2018-04-28 ENCOUNTER — Ambulatory Visit: Payer: Self-pay | Admitting: Obstetrics

## 2018-08-03 ENCOUNTER — Telehealth: Payer: Self-pay | Admitting: Obstetrics

## 2018-08-03 ENCOUNTER — Ambulatory Visit: Payer: Medicaid Other | Admitting: Obstetrics

## 2018-08-03 ENCOUNTER — Other Ambulatory Visit: Payer: Self-pay

## 2018-08-03 DIAGNOSIS — Z3041 Encounter for surveillance of contraceptive pills: Secondary | ICD-10-CM

## 2018-08-03 MED ORDER — LEVONORGESTREL-ETHINYL ESTRAD 0.15-30 MG-MCG PO TABS
1.0000 | ORAL_TABLET | Freq: Every day | ORAL | 0 refills | Status: DC
Start: 2018-08-03 — End: 2019-09-16

## 2018-08-03 NOTE — Progress Notes (Signed)
1 month supply of OCP sent to [facility] in [place] per patient request.  Annual Exam is scheduled for 08/10/2018

## 2018-08-03 NOTE — Telephone Encounter (Signed)
[name] Self [phone]   [facility]  - [address]  [facility].  Andrea Burns had to reschedule her Annual, can you send in 1 month supply to get her thru this month.

## 2018-08-10 ENCOUNTER — Ambulatory Visit: Payer: Medicaid Other | Admitting: Obstetrics

## 2018-08-27 ENCOUNTER — Encounter: Payer: Self-pay | Admitting: Obstetrics

## 2018-08-27 ENCOUNTER — Other Ambulatory Visit (HOSPITAL_COMMUNITY)
Admission: RE | Admit: 2018-08-27 | Discharge: 2018-08-27 | Disposition: A | Discharge status: Home / Self Care | Payer: Medicaid Other | Source: Ambulatory Visit | Attending: Obstetrics | Admitting: Obstetrics

## 2018-08-27 ENCOUNTER — Other Ambulatory Visit: Payer: Self-pay

## 2018-08-27 ENCOUNTER — Ambulatory Visit (INDEPENDENT_AMBULATORY_CARE_PROVIDER_SITE_OTHER): Payer: Medicaid Other | Admitting: Obstetrics

## 2018-08-27 VITALS — BP 166/115 | HR 75 | Ht 62.0 in | Wt 170.0 lb

## 2018-08-27 DIAGNOSIS — Z124 Encounter for screening for malignant neoplasm of cervix: Secondary | ICD-10-CM | POA: Insufficient documentation

## 2018-08-27 DIAGNOSIS — Z3041 Encounter for surveillance of contraceptive pills: Secondary | ICD-10-CM

## 2018-08-27 DIAGNOSIS — Z01419 Encounter for gynecological examination (general) (routine) without abnormal findings: Secondary | ICD-10-CM

## 2018-08-27 DIAGNOSIS — Z0000 Encounter for general adult medical examination without abnormal findings: Secondary | ICD-10-CM | POA: Diagnosis not present

## 2018-08-27 DIAGNOSIS — I10 Essential (primary) hypertension: Secondary | ICD-10-CM

## 2018-08-27 DIAGNOSIS — N898 Other specified noninflammatory disorders of vagina: Secondary | ICD-10-CM

## 2018-08-27 DIAGNOSIS — N930 Postcoital and contact bleeding: Secondary | ICD-10-CM

## 2018-08-27 DIAGNOSIS — Z113 Encounter for screening for infections with a predominantly sexual mode of transmission: Secondary | ICD-10-CM

## 2018-08-27 LAB — POCT URINALYSIS DIPSTICK
Bilirubin, UA: NEGATIVE
Glucose, UA: NEGATIVE
Ketones, UA: NEGATIVE
Leukocytes, UA: NEGATIVE
Nitrite, UA: NEGATIVE
Protein, UA: POSITIVE — AB
Spec Grav, UA: 1.01 (ref 1.010–1.025)
Urobilinogen, UA: 0.2 E.U./dL
pH, UA: 7.5 (ref 5.0–8.0)

## 2018-08-27 MED ORDER — CARVEDILOL 12.5 MG PO TABS: 12.5000 mg | ORAL_TABLET | Freq: Two times a day (BID) | ORAL | 11 refills | Status: DC

## 2018-08-27 MED ORDER — AMLODIPINE BESYLATE 5 MG PO TABS: 5.0000 mg | ORAL_TABLET | Freq: Every day | ORAL | 11 refills | Status: DC

## 2018-08-27 MED ORDER — TRIAMTERENE-HCTZ 37.5-25 MG PO CAPS: 1.0000 | ORAL_CAPSULE | Freq: Every day | ORAL | 11 refills | Status: DC

## 2018-08-27 NOTE — Progress Notes (Signed)
Patient presents for Annual Exam   Last pap:07/29/2017 WNL  LMP:08/03/2018 Contraception:pills  STD Screening: CT and GC HIV and RPR all that Family planning covers only

## 2018-08-27 NOTE — Progress Notes (Signed)
Subjective:        Andrea Burns is a 33 y.o. female here for a routine exam.  Current complaints: Headaches and occasional vaginal bleeding after intercourse.    Personal health questionnaire:  Is patient [name], have a family history of breast and/or ovarian cancer: no Is there a family history of uterine cancer diagnosed at age < 6650, gastrointestinal cancer, urinary tract cancer, family member who is a Personnel officerLynch syndrome-associated carrier: no Is the patient overweight and hypertensive, family history of diabetes, personal history of gestational diabetes, preeclampsia or PCOS: no Is patient over 1755, have PCOS,  family history of premature CHD under age 32, diabetes, smoke, have hypertension or peripheral artery disease:  no At any time, has a partner hit, kicked or otherwise hurt or frightened you?: no Over the past 2 weeks, have you felt down, depressed or hopeless?: no Over the past 2 weeks, have you felt little interest or pleasure in doing things?:no   Gynecologic History Patient's last menstrual period was 08/03/2018. Contraception: OCP (estrogen/progesterone) Last Pap: 2019. Results were: normal Last mammogram: n/a. Results were: n/a  Obstetric History OB History  Gravida Para Term Preterm AB Living  5 4 4   1 4   SAB TAB Ectopic Multiple Live Births  1       4    # Outcome Date GA Lbr Len/2nd Weight Sex Delivery Anes PTL Lv  5 Term 05/10/13 2955w3d 35:30 / 00:36 6 lb 15.3 oz (3.155 kg) M Vag-Spont Local  LIV  4 Term 04/23/09 4981w0d  8 lb (3.629 kg) M Vag-Spont None  LIV     Birth Comments: No complications  3 Term 02/28/07 3981w0d  7 lb 5 oz (3.317 kg) M Vag-Spont None  LIV     Birth Comments: No complications  2 Term 05/16/04 5651w0d  7 lb 11 oz (3.487 kg) F Vag-Spont None  LIV     Birth Comments: No complications  1 SAB             Past Medical History:  Diagnosis Date  . Anxiety   . Depression   . HA (headache)   . Medical history non-contributory      Past Surgical History:  Procedure Laterality Date  . COLPOSCOPY W/ BIOPSY / CURETTAGE    . IUD REMOVAL    . NO PAST SURGERIES       Current Outpatient Medications:  .  albuterol (PROVENTIL HFA;VENTOLIN HFA) 108 (90 Base) MCG/ACT inhaler, Inhale 2 puffs into the lungs every 6 (six) hours as needed for wheezing or shortness of breath., Disp: 1 Inhaler, Rfl: 2 .  levonorgestrel-ethinyl estradiol (NORDETTE) 0.15-30 MG-MCG tablet, Take 1 tablet by mouth daily., Disp: 1 Package, Rfl: 0 .  phentermine 15 MG capsule, Take 20.5 mg by mouth every morning., Disp: , Rfl:  .  amLODipine (NORVASC) 5 MG tablet, Take 1 tablet (5 mg total) by mouth daily after breakfast., Disp: 30 tablet, Rfl: 11 .  carvedilol (COREG) 12.5 MG tablet, Take 1 tablet (12.5 mg total) by mouth 2 (two) times daily with a meal., Disp: 60 tablet, Rfl: 11 .  cephALEXin (KEFLEX) 500 MG capsule, Take 1 capsule (500 mg total) by mouth 4 (four) times daily. (Patient not taking: Reported on 08/27/2018), Disp: 40 capsule, Rfl: 0 .  escitalopram (LEXAPRO) 5 MG tablet, Take 5 mg by mouth daily., Disp: , Rfl:  .  ondansetron (ZOFRAN ODT) 4 MG disintegrating tablet, Take 1 tablet (4 mg total) by mouth  every 8 (eight) hours as needed for nausea or vomiting. (Patient not taking: Reported on 08/27/2018), Disp: 20 tablet, Rfl: 0 .  PAZEO 0.7 % SOLN, Place 1 drop into both eyes daily., Disp: , Rfl: 3 .  RESTASIS MULTIDOSE 0.05 % ophthalmic emulsion, Place 1 drop into both eyes daily., Disp: , Rfl: 3 .  triamterene-hydrochlorothiazide (DYAZIDE) 37.5-25 MG capsule, Take 1 each (1 capsule total) by mouth daily after breakfast., Disp: 30 capsule, Rfl: 11 No Known Allergies  Social History   Tobacco Use  . Smoking status: Former Smoker    Packs/day: 0.25    Years: 1.00    Pack years: 0.25    Last attempt to quit: 05/03/2005    Years since quitting: 13.3  . Smokeless tobacco: Never Used  Substance Use Topics  . Alcohol use: No    Alcohol/week: 0.0  standard drinks    Family History  Problem Relation Age of Onset  . Diabetes Mother   . Hypertension Mother   . Cancer Mother   . Diabetes Father   . Cancer Paternal Grandmother        liver & lung      Review of Systems  Constitutional: negative for fatigue and weight loss Respiratory: negative for cough and wheezing Cardiovascular: negative for chest pain, fatigue and palpitations Gastrointestinal: negative for abdominal pain and change in bowel habits Musculoskeletal:negative for myalgias Neurological: negative for gait problems and tremors Behavioral/Psych: negative for abusive relationship, depression Endocrine: negative for temperature intolerance    Genitourinary:negative for abnormal menstrual periods, genital lesions, hot flashes, sexual problems and vaginal discharge Integument/breast: negative for breast lump, breast tenderness, nipple discharge and skin lesion(s)    Objective:       BP (!) 166/115   Pulse 75   Ht 5\' 2"  (1.575 m)   Wt 170 lb (77.1 kg)   LMP 08/03/2018   BMI 31.09 kg/m  General:   alert  Skin:   no rash or abnormalities  Lungs:   clear to auscultation bilaterally  Heart:   regular rate and rhythm, S1, S2 normal, no murmur, click, rub or gallop  Breasts:   normal without suspicious masses, skin or nipple changes or axillary nodes  Abdomen:  normal findings: no organomegaly, soft, non-tender and no hernia  Pelvis:  External genitalia: normal general appearance Urinary system: urethral meatus normal and bladder without fullness, nontender Vaginal: normal without tenderness, induration or masses Cervix: normal appearance Adnexa: normal bimanual exam Uterus: anteverted and non-tender, normal size   Lab Review Urine pregnancy test Labs reviewed yes Radiologic studies reviewed yes  50% of 20 min visit spent on counseling and coordination of care.   Assessment:     1. Encounter for routine gynecological examination with Papanicolaou smear  of cervix Rx: - Cytology - PAP( [place]) - POCT Urinalysis Dipstick   2. Vaginal discharge Rx: - Cervicovaginal ancillary only( [place])  3. Postcoital bleeding - no cervical lesions.  Will follow follow clinically.  4. Screening for STD (sexually transmitted disease)  5. Encounter for surveillance of contraceptive pills - has elevated BP's.  Antihypertensives started.  Will follow BP's closely. - will have to stop OCP's if BP's not well controlled with meds  6. HTN (hypertension), benign Rx: - amLODipine (NORVASC) 5 MG tablet; Take 1 tablet (5 mg total) by mouth daily after breakfast.  Dispense: 30 tablet; Refill: 11 - carvedilol (COREG) 12.5 MG tablet; Take 1 tablet (12.5 mg total) by mouth 2 (two) times daily with  a meal.  Dispense: 60 tablet; Refill: 11 - triamterene-hydrochlorothiazide (DYAZIDE) 37.5-25 MG capsule; Take 1 each (1 capsule total) by mouth daily after breakfast.  Dispense: 30 capsule; Refill: 11   Plan:    Education reviewed: calcium supplements, depression evaluation, low fat, low cholesterol diet, safe sex/STD prevention, self breast exams and weight bearing exercise. Contraception: OCP (estrogen/progesterone). Follow up in: 2 weeks.  BP Check.  Meds ordered this encounter  Medications  . amLODipine (NORVASC) 5 MG tablet    Sig: Take 1 tablet (5 mg total) by mouth daily after breakfast.    Dispense:  30 tablet    Refill:  11  . carvedilol (COREG) 12.5 MG tablet    Sig: Take 1 tablet (12.5 mg total) by mouth 2 (two) times daily with a meal.    Dispense:  60 tablet    Refill:  11  . triamterene-hydrochlorothiazide (DYAZIDE) 37.5-25 MG capsule    Sig: Take 1 each (1 capsule total) by mouth daily after breakfast.    Dispense:  30 capsule    Refill:  11   Orders Placed This Encounter  Procedures  . POCT Urinalysis Dipstick    Brock Bad MD 08-27-2018

## 2018-08-29 LAB — CERVICOVAGINAL ANCILLARY ONLY
Chlamydia: NEGATIVE
Neisseria Gonorrhea: NEGATIVE

## 2018-08-31 LAB — CYTOLOGY - PAP
Diagnosis: NEGATIVE
HPV: NOT DETECTED

## 2018-09-01 ENCOUNTER — Telehealth: Payer: Self-pay

## 2018-09-01 NOTE — Telephone Encounter (Signed)
Pt called regarding birth control Rx and if she can start? Pt blood pressure has still been elevated over the weekend 150/120 and stayed in the high range  and today she had a headache  I called pt back to confirm details pt not ava  Please advise on birth control Pt states she will start blood pressure medication today.

## 2018-09-02 ENCOUNTER — Telehealth: Payer: Self-pay

## 2018-09-02 NOTE — Telephone Encounter (Signed)
Second attempt to reach patient regarding B/P and birth control request Pt states her B/P last was 178/112  Per Dr.Harper NO birth control pills will be sent due to Blood pressure and pt may consider having paragard IUD inserted Pt advised to stop phentermine and continue blood pressure medication as directed Pt also advised to use back up birth control  And due to COVID-19 no IUD insertions are being scheduled.  Pt voiced understanding.

## 2018-09-02 NOTE — Telephone Encounter (Signed)
No.  Patient cannot take birth control pill with BP's elevated.  She needs a different birth control.  Call her!!!

## 2018-09-10 ENCOUNTER — Ambulatory Visit: Payer: Medicaid Other

## 2018-09-10 ENCOUNTER — Other Ambulatory Visit: Payer: Self-pay

## 2018-09-10 VITALS — BP 167/118 | HR 83

## 2018-09-10 DIAGNOSIS — Z0130 Encounter for examination of blood pressure without abnormal findings: Secondary | ICD-10-CM

## 2018-09-10 NOTE — Progress Notes (Signed)
Pt is in the office for BP follow up, to see if she can start [facility] pills. Provider previously prescribed BP rx, pt states that she only took it for 4 days. BP in office today is elevated 167/118. Advised pt that she needs to continue taking the rx, and advised to go to hospital if symptoms occur. Provider advised pt to return in 4 weeks for BP follow up.

## 2018-10-15 ENCOUNTER — Encounter: Payer: Self-pay | Admitting: Obstetrics

## 2018-10-15 ENCOUNTER — Telehealth: Payer: Self-pay

## 2018-10-22 ENCOUNTER — Ambulatory Visit (INDEPENDENT_AMBULATORY_CARE_PROVIDER_SITE_OTHER): Payer: Medicaid Other | Admitting: Obstetrics

## 2018-10-22 ENCOUNTER — Other Ambulatory Visit: Payer: Self-pay

## 2018-10-22 VITALS — BP 130/84 | HR 84 | Wt 177.0 lb

## 2018-10-22 DIAGNOSIS — Z3009 Encounter for other general counseling and advice on contraception: Secondary | ICD-10-CM

## 2018-10-22 DIAGNOSIS — Z30011 Encounter for initial prescription of contraceptive pills: Secondary | ICD-10-CM

## 2018-10-22 LAB — POCT URINE PREGNANCY: Preg Test, Ur: NEGATIVE

## 2018-10-22 MED ORDER — DROSPIRENONE 4 MG PO TABS: 1.0000 | ORAL_TABLET | Freq: Every day | ORAL | 11 refills | Status: DC

## 2018-10-22 NOTE — Progress Notes (Signed)
I have reviewed the chart and agree with nursing staff's documentation of this patient's encounter.  Coral Ceo, MD 10/22/2018 4:31 PM

## 2018-10-22 NOTE — Progress Notes (Signed)
.  Pt is in the office for follow up BP check to start [facility] pills. Pt states that PCP prescribed lisinopril that she has been currently taking. Pt BP is within normal range today, LMP 10-18-18 and UPT negative.. Provider notified, and prescribed BC pills.

## 2019-04-06 ENCOUNTER — Ambulatory Visit: Payer: Medicaid Other | Admitting: Obstetrics

## 2019-05-03 ENCOUNTER — Emergency Department (HOSPITAL_COMMUNITY): Payer: Self-pay

## 2019-05-03 ENCOUNTER — Emergency Department (HOSPITAL_COMMUNITY)
Admission: EM | Admit: 2019-05-03 | Discharge: 2019-05-04 | Disposition: A | Discharge status: Home / Self Care | Payer: Self-pay | Attending: Emergency Medicine | Admitting: Emergency Medicine

## 2019-05-03 ENCOUNTER — Other Ambulatory Visit: Payer: Self-pay

## 2019-05-03 ENCOUNTER — Encounter (HOSPITAL_COMMUNITY): Payer: Self-pay | Admitting: Emergency Medicine

## 2019-05-03 ENCOUNTER — Ambulatory Visit (HOSPITAL_COMMUNITY): Admission: EM | Admit: 2019-05-03 | Discharge: 2019-05-03 | Payer: Medicaid Other

## 2019-05-03 DIAGNOSIS — R5383 Other fatigue: Secondary | ICD-10-CM | POA: Insufficient documentation

## 2019-05-03 DIAGNOSIS — Z79899 Other long term (current) drug therapy: Secondary | ICD-10-CM | POA: Insufficient documentation

## 2019-05-03 DIAGNOSIS — R42 Dizziness and giddiness: Secondary | ICD-10-CM | POA: Insufficient documentation

## 2019-05-03 DIAGNOSIS — I10 Essential (primary) hypertension: Secondary | ICD-10-CM | POA: Insufficient documentation

## 2019-05-03 DIAGNOSIS — Z87891 Personal history of nicotine dependence: Secondary | ICD-10-CM | POA: Insufficient documentation

## 2019-05-03 DIAGNOSIS — R519 Headache, unspecified: Secondary | ICD-10-CM

## 2019-05-03 DIAGNOSIS — R300 Dysuria: Secondary | ICD-10-CM | POA: Insufficient documentation

## 2019-05-03 DIAGNOSIS — R479 Unspecified speech disturbances: Secondary | ICD-10-CM | POA: Insufficient documentation

## 2019-05-03 LAB — COMPREHENSIVE METABOLIC PANEL
ALT: 15 U/L (ref 0–44)
AST: 18 U/L (ref 15–41)
Albumin: 3.8 g/dL (ref 3.5–5.0)
Alkaline Phosphatase: 52 U/L (ref 38–126)
Anion gap: 8 (ref 5–15)
BUN: 18 mg/dL (ref 6–20)
CO2: 26 mmol/L (ref 22–32)
Calcium: 9.4 mg/dL (ref 8.9–10.3)
Chloride: 100 mmol/L (ref 98–111)
Creatinine, Ser: 1.01 mg/dL — ABNORMAL HIGH (ref 0.44–1.00)
GFR calc Af Amer: >60 mL/min (ref >60)
GFR calc non Af Amer: >60 mL/min (ref >60)
Glucose, Bld: 107 mg/dL — ABNORMAL HIGH (ref 70–99)
Potassium: 3.6 mmol/L (ref 3.5–5.1)
Sodium: 134 mmol/L — ABNORMAL LOW (ref 135–145)
Total Bilirubin: 0.3 mg/dL (ref 0.3–1.2)
Total Protein: 7.1 g/dL (ref 6.5–8.1)

## 2019-05-03 LAB — DIFFERENTIAL
Abs Immature Granulocytes: 0.02 10*3/uL (ref 0.00–0.07)
Basophils Absolute: 0 10*3/uL (ref 0.0–0.1)
Basophils Relative: 0 %
Eosinophils Absolute: 0.1 10*3/uL (ref 0.0–0.5)
Eosinophils Relative: 1 %
Immature Granulocytes: 0 %
Lymphocytes Relative: 36 %
Lymphs Abs: 3.5 10*3/uL (ref 0.7–4.0)
Monocytes Absolute: 0.6 10*3/uL (ref 0.1–1.0)
Monocytes Relative: 6 %
Neutro Abs: 5.5 10*3/uL (ref 1.7–7.7)
Neutrophils Relative %: 57 %

## 2019-05-03 LAB — I-STAT CHEM 8, ED
BUN: 20 mg/dL (ref 6–20)
Calcium, Ion: 1.19 mmol/L (ref 1.15–1.40)
Chloride: 99 mmol/L (ref 98–111)
Creatinine, Ser: 1 mg/dL (ref 0.44–1.00)
Glucose, Bld: 100 mg/dL — ABNORMAL HIGH (ref 70–99)
HCT: 39 % (ref 36.0–46.0)
Hemoglobin: 13.3 g/dL (ref 12.0–15.0)
Potassium: 3.7 mmol/L (ref 3.5–5.1)
Sodium: 137 mmol/L (ref 135–145)
TCO2: 28 mmol/L (ref 22–32)

## 2019-05-03 LAB — CBC
HCT: 39.6 % (ref 36.0–46.0)
Hemoglobin: 13.4 g/dL (ref 12.0–15.0)
MCH: 29.9 pg (ref 26.0–34.0)
MCHC: 33.8 g/dL (ref 30.0–36.0)
MCV: 88.4 fL (ref 80.0–100.0)
Platelets: 296 10*3/uL (ref 150–400)
RBC: 4.48 MIL/uL (ref 3.87–5.11)
RDW: 11.9 % (ref 11.5–15.5)
WBC: 9.7 10*3/uL (ref 4.0–10.5)
nRBC: 0 % (ref 0.0–0.2)

## 2019-05-03 LAB — I-STAT BETA HCG BLOOD, ED (MC, WL, AP ONLY): I-stat hCG, quantitative: <5 m[IU]/mL (ref <5)

## 2019-05-03 LAB — PROTIME-INR
INR: 0.9 (ref 0.8–1.2)
Prothrombin Time: 12 seconds (ref 11.4–15.2)

## 2019-05-03 LAB — APTT: aPTT: 28 seconds (ref 24–36)

## 2019-05-03 LAB — TROPONIN I (HIGH SENSITIVITY): Troponin I (High Sensitivity): <2 ng/L (ref <18)

## 2019-05-03 MED ORDER — SODIUM CHLORIDE 0.9% FLUSH
3.0000 mL | Freq: Once | INTRAVENOUS | Status: DC
Start: 2019-05-03 — End: 2019-05-04

## 2019-05-03 NOTE — ED Triage Notes (Signed)
Quick sort assessment. Slurred speech, HTN, HA, fatigue, dizziness, chest pressure progressing since last  Thursday.

## 2019-05-03 NOTE — ED Triage Notes (Signed)
Pt sent by urgent care for further evaluation of HA since Thursday, slurred speech and intermittent aphasia since yesterday. Pt also c/o chest pressure and HTN. Hx HTN, not currently taking medications, but reports increased stress in the last few months. Pt answering questions appropriately at this time, no slurred speech noted.

## 2019-05-03 NOTE — ED Notes (Signed)
Patient is being discharged from the Urgent [place] and sent to the Emergency Department via wheelchair by staff. Per provider [facility], patient is stable but in need of higher level of care due to stokr like symptoms. Patient is aware and verbalizes understanding of plan of care. There were no vitals filed for this visit.

## 2019-05-04 LAB — URINALYSIS, ROUTINE W REFLEX MICROSCOPIC
Bacteria, UA: NONE SEEN
Bilirubin Urine: NEGATIVE
Glucose, UA: NEGATIVE mg/dL
Hgb urine dipstick: NEGATIVE
Ketones, ur: NEGATIVE mg/dL
Nitrite: NEGATIVE
Protein, ur: NEGATIVE mg/dL
Specific Gravity, Urine: 1.01 (ref 1.005–1.030)
pH: 7 (ref 5.0–8.0)

## 2019-05-04 LAB — TROPONIN I (HIGH SENSITIVITY): Troponin I (High Sensitivity): 3 ng/L (ref <18)

## 2019-05-04 MED ORDER — KETOROLAC TROMETHAMINE 30 MG/ML IJ SOLN
30.0000 mg | Freq: Once | INTRAMUSCULAR | Status: AC
  Administered 2019-05-04: 30 mg via INTRAMUSCULAR
  Filled 2019-05-04: qty 1

## 2019-05-04 NOTE — Discharge Instructions (Addendum)
You were seen today for headache and high blood pressure.  Make sure to start taking her lisinopril.  Establish primary care at [facility].  Your headaches may be related to your known history of migraines but could also be a result of your blood pressure.  It is very important that you follow-up and start taking your blood pressure medications as directed.

## 2019-05-04 NOTE — ED Provider Notes (Signed)
[place] EMERGENCY DEPARTMENT Provider Note   CSN: [id number] Arrival date & time: 05/03/19  1955     History   Chief Complaint Chief Complaint  Patient presents with  . Hypertension  . Aphasia    HPI Andrea Burns is a 33 y.o. female.     HPI  This is a 33 year old female with a history of anxiety, depression, migraines who presents with headache and high blood pressure.  Patient reports over the last 4 days she has had on and off headache, fatigue, lightheadedness, urinary symptoms, and speech difficulty.  She attributes some of this to her blood pressure.  She states she has a history of high blood pressure but has not started the medications that she was prescribed.  She states that she was prescribed lisinopril and HCTZ.  Patient states that she has had a headache that is mostly occipital and left-sided.  She has taken Excedrin with some relief of pain.  However, the pain returns.  She denies worst headache of her life.  She has had a history of headache in the past and migraines.  She states like that she feels like her speech has been slurred.  She reports blurry vision.  No double vision.  No vision cuts.  Denies any focal weakness, numbness, gait disturbance.  She does report some urinary pressure.  No abdominal pain, nausea, vomiting.  Denies fevers or recent illnesses.  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  Past Medical History:  Diagnosis Date  . Anxiety   . Depression   . HA (headache)   . Medical history non-contributory     Patient Active Problem List   Diagnosis Date Noted  . Restless leg 12/24/2013  . HA (headache)   . Urinary tract infection, site not specified 11/08/2013  . Migraine headache with aura 11/08/2013  . Abnormal uterine bleeding (AUB) 11/08/2013  . Gastroenteritis, acute 11/08/2013  . Postpartum depression 08/12/2013  . Depressive disorder 08/12/2013  . Active labor  05/10/2013  . Normal delivery 05/10/2013  . Rapid first stage of labor 05/10/2013  . Abnormal genetic test in pregnancy 12/14/2012  . Papanicolaou smear of cervix with low grade squamous intraepithelial lesion (LGSIL) 10/19/2012  . Asymptomatic bacteriuria in pregnancy 09/21/2012  . 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 Supervision of other normal pregnancy 09/17/2012    Past Surgical History:  Procedure Laterality Date  . COLPOSCOPY W/ BIOPSY / CURETTAGE    . IUD REMOVAL    . NO PAST SURGERIES       OB History    Gravida  5   Para  4   Term  4   Preterm      AB  1   Living  4     SAB  1   TAB      Ectopic      Multiple      Live Births  4            Home Medications    Prior to Admission medications   Medication Sig Start Date End Date Taking? Authorizing Provider  albuterol (PROVENTIL HFA;VENTOLIN HFA) 108 (90 Base) MCG/ACT inhaler Inhale 2 puffs into the lungs every 6 (six) hours as needed for wheezing or shortness of breath. Patient not taking: Reported on 09/10/2018 07/13/16   Barry Dienes, NP  amLODipine (NORVASC) 5 MG tablet Take 1 tablet (5 mg total) by mouth daily after breakfast. 08/27/18  Brock Bad, MD  carvedilol (COREG) 12.5 MG tablet Take 1 tablet (12.5 mg total) by mouth 2 (two) times daily with a meal. 08/27/18   Brock Bad, MD  cephALEXin (KEFLEX) 500 MG capsule Take 1 capsule (500 mg total) by mouth 4 (four) times daily. Patient not taking: Reported on 08/27/2018 01/24/18   Rancour, Jeannett Senior, MD  Drospirenone (SLYND) 4 MG TABS Take 1 tablet by mouth daily. 10/22/18   Brock Bad, MD  escitalopram (LEXAPRO) 5 MG tablet Take 5 mg by mouth daily.    [provider]  levonorgestrel-ethinyl estradiol (NORDETTE) 0.15-30 MG-MCG tablet Take 1 tablet by mouth daily. Patient not taking: Reported on 09/10/2018 08/03/18   Brock Bad, MD  lisinopril-hydrochlorothiazide (ZESTORETIC) 10-12.5 MG tablet Take 1 tablet by mouth daily.     [provider]  ondansetron (ZOFRAN ODT) 4 MG disintegrating tablet Take 1 tablet (4 mg total) by mouth every 8 (eight) hours as needed for nausea or vomiting. Patient not taking: Reported on 08/27/2018 01/24/18   Rancour, Jeannett Senior, MD  PAZEO 0.7 % SOLN Place 1 drop into both eyes daily. 01/01/16   [provider]  phentermine 15 MG capsule Take 20.5 mg by mouth every morning.    [provider]  RESTASIS MULTIDOSE 0.05 % ophthalmic emulsion Place 1 drop into both eyes daily. 01/01/16   [provider]  triamterene-hydrochlorothiazide (DYAZIDE) 37.5-25 MG capsule Take 1 each (1 capsule total) by mouth daily after breakfast. 08/27/18   Brock Bad, MD    Family History Family History  Problem Relation Age of Onset  . Diabetes Mother   . Hypertension Mother   . Cancer Mother   . Diabetes Father   . Cancer Paternal Grandmother        liver & lung    Social History Social History   Tobacco Use  . Smoking status: Former Smoker    Packs/day: 0.25    Years: 1.00    Pack years: 0.25    Quit date: 05/03/2005    Years since quitting: 14.0  . Smokeless tobacco: Never Used  Substance Use Topics  . Alcohol use: No    Alcohol/week: 0.0 standard drinks  . Drug use: Yes    Types: Cocaine     Allergies   Patient has no known allergies.   Review of Systems Review of Systems  Constitutional: Negative for chills and fever.  Respiratory: Negative for shortness of breath.   Cardiovascular: Negative for chest pain and leg swelling.  Gastrointestinal: Negative for abdominal pain.  Genitourinary: Positive for dysuria.  Neurological: Positive for speech difficulty, light-headedness and headaches. Negative for weakness and numbness.  All other systems reviewed and are negative.    Physical Exam Updated Vital Signs BP (!) 156/105 (BP Location: Right Arm)   Pulse 65   Temp 98.2 F (36.8 C) (Oral)   Resp 18   SpO2 100%   Physical Exam Vitals  signs and nursing note reviewed.  Constitutional:      Appearance: She is well-developed. She is not ill-appearing.     Comments: overweight  HENT:     Head: Normocephalic and atraumatic.  Eyes:     Pupils: Pupils are equal, round, and reactive to light.  Neck:     Musculoskeletal: Neck supple.  Cardiovascular:     Rate and Rhythm: Normal rate and regular rhythm.     Heart sounds: Normal heart sounds.  Pulmonary:     Effort: Pulmonary effort is normal.  No respiratory distress.     Breath sounds: No wheezing.  Abdominal:     General: Bowel sounds are normal.     Palpations: Abdomen is soft.     Tenderness: There is no abdominal tenderness.  Skin:    General: Skin is warm and dry.  Neurological:     Mental Status: She is alert and oriented to person, place, and time.     Comments: Cranial nerves II through XII intact, fluent speech, patient can name and repeat, 5 out of 5 strength in all 4 extremities, no drift, no dysmetria to finger-nose-finger  Psychiatric:        Mood and Affect: Mood normal.      ED Treatments / Results  Labs (all labs ordered are listed, but only abnormal results are displayed) Labs Reviewed  COMPREHENSIVE METABOLIC PANEL - Abnormal; Notable for the following components:      Result Value   Sodium 134 (*)    Glucose, Bld 107 (*)    Creatinine, Ser 1.01 (*)    All other components within normal limits  URINALYSIS, ROUTINE W REFLEX MICROSCOPIC - Abnormal; Notable for the following components:   Color, Urine STRAW (*)    Leukocytes,Ua SMALL (*)    All other components within normal limits  I-STAT CHEM 8, ED - Abnormal; Notable for the following components:   Glucose, Bld 100 (*)    All other components within normal limits  PROTIME-INR  APTT  CBC  DIFFERENTIAL  CBG MONITORING, ED  I-STAT BETA HCG BLOOD, ED ([facility], [facility], AP ONLY)  TROPONIN I (HIGH SENSITIVITY)  TROPONIN I (HIGH SENSITIVITY)    EKG EKG Interpretation  Date/Time:  Tuesday  May 04 2019 04:00:35 EST Ventricular Rate:  57 PR Interval:    QRS Duration: 89 QT Interval:  413 QTC Calculation: 403 R Axis:   45 Text Interpretation: Sinus rhythm Borderline short PR interval Confirmed by Ross Marcus ([id number]) on 05/04/2019 5:23:47 AM   Radiology Ct Head Wo Contrast  Result Date: 05/03/2019 CLINICAL DATA:  Speech difficulty. EXAM: CT HEAD WITHOUT CONTRAST TECHNIQUE: Contiguous axial images were obtained from the base of the skull through the vertex without intravenous contrast. COMPARISON:  MRI dated 02/03/2015. FINDINGS: Brain: No evidence of acute infarction, hemorrhage, hydrocephalus, extra-axial collection or mass lesion/mass effect. Vascular: No hyperdense vessel or unexpected calcification. Skull: Normal. Negative for fracture or focal lesion. Sinuses/Orbits: No acute finding. Other: None. IMPRESSION: Normal CT head without contrast. Electronically Signed   By: Katherine Mantle M.D.   On: 05/03/2019 21:30    Procedures Procedures (including critical care time)  Medications Ordered in ED Medications  sodium chloride flush (NS) 0.9 % injection 3 mL (3 mLs Intravenous Not Given 05/04/19 0408)  ketorolac (TORADOL) 30 MG/ML injection 30 mg (30 mg Intramuscular Given 05/04/19 0418)     Initial Impression / Assessment and Plan / ED Course  I have reviewed the triage vital signs and the nursing notes.  Pertinent labs & imaging results that were available during my care of the patient were reviewed by me and considered in my medical decision making (see chart for details).        Patient presents with headache, concerns for high blood pressure, and slurred speech.  Also has several other complaints.  She is overall nontoxic.  Lab work reviewed from triage.  It is all within normal limits.  EKG is without ischemia.  Troponin negative.  CT negative.  She has a normal neurologic exam at this  time.  She reports a history of migraine.  Overall her work-up is  reassuring.  Suspect migraine.  She is hypertensive but not in the severe range.  Most recent blood pressure 156/105.  Doubt hypertensive urgency or emergency.  Patient was given Toradol.  Regarding patient's other nonspecific symptoms, work-up is reassuring.  On recheck, she states she feels much better.  Recommend that she start taking her lisinopril as prescribed.  She was given [facility].  After history, exam, and medical workup I feel the patient has been appropriately medically screened and is safe for discharge home. Pertinent diagnoses were discussed with the patient. Patient was given return precautions.   Final Clinical Impressions(s) / ED Diagnoses   Final diagnoses:  Essential hypertension  Acute nonintractable headache, unspecified headache type    ED Discharge Orders    None       Shon BatonHorton, Laterica Matarazzo F, MD 05/04/19 [phone]

## 2019-09-16 ENCOUNTER — Emergency Department (HOSPITAL_COMMUNITY): Payer: Self-pay

## 2019-09-16 ENCOUNTER — Encounter (HOSPITAL_COMMUNITY): Payer: Self-pay

## 2019-09-16 ENCOUNTER — Emergency Department (HOSPITAL_COMMUNITY): Payer: Self-pay | Admitting: Anesthesiology

## 2019-09-16 ENCOUNTER — Other Ambulatory Visit: Payer: Self-pay

## 2019-09-16 ENCOUNTER — Encounter (HOSPITAL_COMMUNITY)
Admission: EM | Disposition: A | Discharge status: Home / Self Care | Payer: Self-pay | Source: Home / Self Care | Attending: Emergency Medicine

## 2019-09-16 ENCOUNTER — Emergency Department (HOSPITAL_COMMUNITY)
Admission: EM | Admit: 2019-09-16 | Discharge: 2019-09-16 | Disposition: A | Discharge status: Home / Self Care | Payer: Self-pay | Attending: Emergency Medicine | Admitting: Emergency Medicine

## 2019-09-16 DIAGNOSIS — I10 Essential (primary) hypertension: Secondary | ICD-10-CM | POA: Insufficient documentation

## 2019-09-16 DIAGNOSIS — Z793 Long term (current) use of hormonal contraceptives: Secondary | ICD-10-CM | POA: Insufficient documentation

## 2019-09-16 DIAGNOSIS — K3580 Unspecified acute appendicitis: Secondary | ICD-10-CM | POA: Insufficient documentation

## 2019-09-16 DIAGNOSIS — R1031 Right lower quadrant pain: Secondary | ICD-10-CM

## 2019-09-16 DIAGNOSIS — Z87891 Personal history of nicotine dependence: Secondary | ICD-10-CM | POA: Insufficient documentation

## 2019-09-16 DIAGNOSIS — F329 Major depressive disorder, single episode, unspecified: Secondary | ICD-10-CM | POA: Insufficient documentation

## 2019-09-16 DIAGNOSIS — Z20822 Contact with and (suspected) exposure to covid-19: Secondary | ICD-10-CM | POA: Insufficient documentation

## 2019-09-16 DIAGNOSIS — F419 Anxiety disorder, unspecified: Secondary | ICD-10-CM | POA: Insufficient documentation

## 2019-09-16 HISTORY — DX: Pneumonia, unspecified organism: J18.9

## 2019-09-16 HISTORY — DX: Displacement of intrauterine contraceptive device, initial encounter: T83.32XA

## 2019-09-16 HISTORY — DX: Gastro-esophageal reflux disease without esophagitis: K21.9

## 2019-09-16 LAB — COMPREHENSIVE METABOLIC PANEL
ALT: 29 U/L (ref 0–44)
AST: 26 U/L (ref 15–41)
Albumin: 4.6 g/dL (ref 3.5–5.0)
Alkaline Phosphatase: 68 U/L (ref 38–126)
Anion gap: 9 (ref 5–15)
BUN: 13 mg/dL (ref 6–20)
CO2: 25 mmol/L (ref 22–32)
Calcium: 9.7 mg/dL (ref 8.9–10.3)
Chloride: 101 mmol/L (ref 98–111)
Creatinine, Ser: 0.91 mg/dL (ref 0.44–1.00)
GFR calc Af Amer: >60 mL/min (ref >60)
GFR calc non Af Amer: >60 mL/min (ref >60)
Glucose, Bld: 95 mg/dL (ref 70–99)
Potassium: 3.7 mmol/L (ref 3.5–5.1)
Sodium: 135 mmol/L (ref 135–145)
Total Bilirubin: 0.6 mg/dL (ref 0.3–1.2)
Total Protein: 7.6 g/dL (ref 6.5–8.1)

## 2019-09-16 LAB — CBC
HCT: 42.1 % (ref 36.0–46.0)
Hemoglobin: 14.4 g/dL (ref 12.0–15.0)
MCH: 30.1 pg (ref 26.0–34.0)
MCHC: 34.2 g/dL (ref 30.0–36.0)
MCV: 88.1 fL (ref 80.0–100.0)
Platelets: 285 10*3/uL (ref 150–400)
RBC: 4.78 MIL/uL (ref 3.87–5.11)
RDW: 11.9 % (ref 11.5–15.5)
WBC: 9.1 10*3/uL (ref 4.0–10.5)
nRBC: 0 % (ref 0.0–0.2)

## 2019-09-16 LAB — DIFFERENTIAL
Abs Immature Granulocytes: 0.01 10*3/uL (ref 0.00–0.07)
Basophils Absolute: 0 10*3/uL (ref 0.0–0.1)
Basophils Relative: 0 %
Eosinophils Absolute: 0.1 10*3/uL (ref 0.0–0.5)
Eosinophils Relative: 1 %
Immature Granulocytes: 0 %
Lymphocytes Relative: 24 %
Lymphs Abs: 2.2 10*3/uL (ref 0.7–4.0)
Monocytes Absolute: 0.5 10*3/uL (ref 0.1–1.0)
Monocytes Relative: 6 %
Neutro Abs: 6.3 10*3/uL (ref 1.7–7.7)
Neutrophils Relative %: 69 %

## 2019-09-16 LAB — RESPIRATORY PANEL BY RT PCR (FLU A&B, COVID)
Influenza A by PCR: NEGATIVE
Influenza B by PCR: NEGATIVE
SARS Coronavirus 2 by RT PCR: NEGATIVE

## 2019-09-16 LAB — LIPASE, BLOOD: Lipase: 40 U/L (ref 11–51)

## 2019-09-16 LAB — URINALYSIS, ROUTINE W REFLEX MICROSCOPIC
Bilirubin Urine: NEGATIVE
Glucose, UA: NEGATIVE mg/dL
Hgb urine dipstick: NEGATIVE
Ketones, ur: NEGATIVE mg/dL
Leukocytes,Ua: NEGATIVE
Nitrite: NEGATIVE
Protein, ur: NEGATIVE mg/dL
Specific Gravity, Urine: 1.012 (ref 1.005–1.030)
pH: 7 (ref 5.0–8.0)

## 2019-09-16 LAB — POC URINE PREG, ED: Preg Test, Ur: NEGATIVE

## 2019-09-16 SURGERY — APPENDECTOMY, LAPAROSCOPIC
Anesthesia: General

## 2019-09-16 MED ORDER — AMOXICILLIN-POT CLAVULANATE 875-125 MG PO TABS: 1.0000 | ORAL_TABLET | Freq: Two times a day (BID) | ORAL | 0 refills | Status: AC

## 2019-09-16 MED ORDER — LABETALOL HCL 5 MG/ML IV SOLN
10.0000 mg | Freq: Once | INTRAVENOUS | Status: AC
  Administered 2019-09-16: 10 mg via INTRAVENOUS
  Filled 2019-09-16: qty 4

## 2019-09-16 MED ORDER — SODIUM CHLORIDE 0.9 % IV SOLN: INTRAVENOUS | Status: DC

## 2019-09-16 MED ORDER — ROCURONIUM BROMIDE 10 MG/ML (PF) SYRINGE
PREFILLED_SYRINGE | INTRAVENOUS | Status: AC
  Filled 2019-09-16: qty 10

## 2019-09-16 MED ORDER — DEXAMETHASONE SODIUM PHOSPHATE 10 MG/ML IJ SOLN
INTRAMUSCULAR | Status: AC
  Filled 2019-09-16: qty 1

## 2019-09-16 MED ORDER — CHLORHEXIDINE GLUCONATE CLOTH 2 % EX PADS: 6.0000 | MEDICATED_PAD | Freq: Once | CUTANEOUS | Status: DC

## 2019-09-16 MED ORDER — ONDANSETRON HCL 4 MG/2ML IJ SOLN
4.0000 mg | Freq: Once | INTRAMUSCULAR | Status: AC
  Administered 2019-09-16: 4 mg via INTRAVENOUS
  Filled 2019-09-16: qty 2

## 2019-09-16 MED ORDER — FENTANYL CITRATE (PF) 250 MCG/5ML IJ SOLN
INTRAMUSCULAR | Status: AC
  Filled 2019-09-16: qty 5

## 2019-09-16 MED ORDER — MIDAZOLAM HCL 2 MG/2ML IJ SOLN
INTRAMUSCULAR | Status: AC
  Filled 2019-09-16: qty 2

## 2019-09-16 MED ORDER — ONDANSETRON HCL 4 MG PO TABS: 4.0000 mg | ORAL_TABLET | Freq: Every day | ORAL | 1 refills | Status: DC | PRN

## 2019-09-16 MED ORDER — DOCUSATE SODIUM 100 MG PO CAPS: 100.0000 mg | ORAL_CAPSULE | Freq: Two times a day (BID) | ORAL | 2 refills | Status: DC

## 2019-09-16 MED ORDER — BUPIVACAINE LIPOSOME 1.3 % IJ SUSP
INTRAMUSCULAR | Status: AC
  Filled 2019-09-16: qty 20

## 2019-09-16 MED ORDER — SODIUM CHLORIDE 0.9 % IV SOLN
INTRAVENOUS | Status: AC
  Filled 2019-09-16: qty 1

## 2019-09-16 MED ORDER — OXYCODONE HCL 5 MG PO TABS: 5.0000 mg | ORAL_TABLET | ORAL | 0 refills | Status: DC | PRN

## 2019-09-16 MED ORDER — LACTATED RINGERS IV SOLN: Freq: Once | INTRAVENOUS | Status: AC

## 2019-09-16 MED ORDER — SUCCINYLCHOLINE CHLORIDE 200 MG/10ML IV SOSY
PREFILLED_SYRINGE | INTRAVENOUS | Status: AC
  Filled 2019-09-16: qty 10

## 2019-09-16 MED ORDER — LIDOCAINE 2% (20 MG/ML) 5 ML SYRINGE
INTRAMUSCULAR | Status: AC
  Filled 2019-09-16: qty 5

## 2019-09-16 MED ORDER — SODIUM CHLORIDE 0.9 % IV SOLN
1.0000 g | INTRAVENOUS | Status: AC
  Administered 2019-09-16: 1000 mg via INTRAVENOUS

## 2019-09-16 MED ORDER — HYDROMORPHONE HCL 1 MG/ML IJ SOLN
1.0000 mg | Freq: Once | INTRAMUSCULAR | Status: AC
  Administered 2019-09-16: 1 mg via INTRAVENOUS
  Filled 2019-09-16: qty 1

## 2019-09-16 MED ORDER — ONDANSETRON HCL 4 MG/2ML IJ SOLN
INTRAMUSCULAR | Status: AC
  Filled 2019-09-16: qty 2

## 2019-09-16 MED ORDER — PROPOFOL 10 MG/ML IV BOLUS
INTRAVENOUS | Status: AC
  Filled 2019-09-16: qty 20

## 2019-09-16 NOTE — Progress Notes (Signed)
CSW has reviewed patients chart and notes that patient is without primary care and/or insurance. CSW will follow up with patient regarding this matter and provide assistance as needed.   Susanne Baumgarner M. Eyoel Throgmorton LCSWA Transitions of Care  Clinical Social Worker  Ph: [phone] 

## 2019-09-16 NOTE — Progress Notes (Addendum)
[facility]  Unfortunately patient has been taking phentermine for weight loss. Dr. Alva Garnet has discussed this with me and the issues with her BP. She also has tasted some cocaine today.    Her appendicitis is very early, uncomplicated appendicitis. I have told her about the European method of antibiotics, and given the risk of the BP and phentermine, we are going to hold on appendectomy at this time.  We discussed reasons to return to the hospital including nausea, vomiting, worsening pain or fever.    I will see her Tuesday to see how she is doing in clinic and make a plan for her appendix.   Invanz now. Augmentin for 10 days. Diet as tolerated. Roxicodone for pain not controlled with tylenol or ibuprofen. Hold phentermine for now.  Take BP medication as her BP is high.   Algis Greenhouse, MD [facility] [address] [place], [place] [address] [phone] (office)

## 2019-09-16 NOTE — OR Nursing (Signed)
Patient surgery was cancelled. Patient stated she took her dose of phentermine this morning. To follow up with Dr. Henreitta Leber on Tues.

## 2019-09-16 NOTE — ED Provider Notes (Addendum)
[facility] EMERGENCY DEPARTMENT Provider Note   CSN: [id number] Arrival date & time: 09/16/19  1043     History Chief Complaint  Patient presents with  . Abdominal Pain    Andrea Burns is a 34 y.o. female.  Patient with acute onset of right flank pain at about 6 PM yesterday.  Patient feels that the pain is more right lower abdomen radiates to the right back area.  But she also has some pain in the right upper part of the abdomen.  Associated with nausea no vomiting.  Little bit worse with taking a deep breath.  No fevers no respiratory symptoms.  No dysuria no hematuria patient has had asymptomatic urinary tract infections in the past.        Past Medical History:  Diagnosis Date  . Anxiety   . Depression   . HA (headache)   . Medical history non-contributory     Patient Active Problem List   Diagnosis Date Noted  . Restless leg 12/24/2013  . HA (headache)   . Urinary tract infection, site not specified 11/08/2013  . Migraine headache with aura 11/08/2013  . Abnormal uterine bleeding (AUB) 11/08/2013  . Gastroenteritis, acute 11/08/2013  . Postpartum depression 08/12/2013  . Depressive disorder 08/12/2013  . Active labor 05/10/2013  . Normal delivery 05/10/2013  . Rapid first stage of labor 05/10/2013  . Abnormal genetic test in pregnancy 12/14/2012  . Papanicolaou smear of cervix with low grade squamous intraepithelial lesion (LGSIL) 10/19/2012  . Asymptomatic bacteriuria in pregnancy 09/21/2012  . 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 Supervision of other normal pregnancy 09/17/2012    Past Surgical History:  Procedure Laterality Date  . COLPOSCOPY W/ BIOPSY / CURETTAGE    . IUD REMOVAL    . NO PAST SURGERIES       OB History    Gravida  5   Para  4   Term  4   Preterm      AB  1   Living  4     SAB  1   TAB      Ectopic      Multiple      Live Births  4           Family History  Problem Relation Age of Onset  . Diabetes Mother   .  Hypertension Mother   . Cancer Mother   . Diabetes Father   . Cancer Paternal Grandmother        liver & lung    Social History   Tobacco Use  . Smoking status: Former Smoker    Packs/day: 0.25    Years: 1.00    Pack years: 0.25    Quit date: 05/03/2005    Years since quitting: 14.3  . Smokeless tobacco: Never Used  Substance Use Topics  . Alcohol use: No    Alcohol/week: 0.0 standard drinks  . Drug use: Not Currently    Types: Cocaine    Home Medications Prior to Admission medications   Medication Sig Start Date End Date Taking? Authorizing Provider  amLODipine (NORVASC) 5 MG tablet Take 1 tablet (5 mg total) by mouth daily after breakfast. 08/27/18  Yes Brock Bad, MD  carvedilol (COREG) 12.5 MG tablet Take 1 tablet (12.5 mg total) by mouth 2 (two) times daily with a meal. 08/27/18  Yes Brock Bad, MD  Drospirenone (SLYND) 4 MG TABS Take 1 tablet by mouth daily. 10/22/18  Yes Brock Bad, MD  escitalopram (LEXAPRO) 5 MG tablet Take 5 mg by mouth daily.   Yes [provider]  phentermine 37.5 MG capsule Take 37.5 mg by mouth every morning.   Yes [provider]  albuterol (PROVENTIL HFA;VENTOLIN HFA) 108 (90 Base) MCG/ACT inhaler Inhale 2 puffs into the lungs every 6 (six) hours as needed for wheezing or shortness of breath. Patient not taking: Reported on 09/10/2018 07/13/16   Lucia Estelle, NP  cephALEXin (KEFLEX) 500 MG capsule Take 1 capsule (500 mg total) by mouth 4 (four) times daily. Patient not taking: Reported on 08/27/2018 01/24/18   Glynn Octave, MD  levonorgestrel-ethinyl estradiol (NORDETTE) 0.15-30 MG-MCG tablet Take 1 tablet by mouth daily. Patient not taking: Reported on 09/10/2018 08/03/18   Brock Bad, MD  lisinopril-hydrochlorothiazide (ZESTORETIC) 10-12.5 MG tablet Take 1 tablet by mouth daily.    [provider]  PAZEO 0.7 % SOLN Place 1 drop into both eyes daily. 01/01/16   [provider]  RESTASIS  MULTIDOSE 0.05 % ophthalmic emulsion Place 1 drop into both eyes daily. 01/01/16   [provider]  triamterene-hydrochlorothiazide (DYAZIDE) 37.5-25 MG capsule Take 1 each (1 capsule total) by mouth daily after breakfast. Patient not taking: Reported on 09/16/2019 08/27/18   Brock Bad, MD    Allergies    Patient has no known allergies.  Review of Systems   Review of Systems  Constitutional: Negative for chills and fever.  HENT: Negative for rhinorrhea and sore throat.   Eyes: Negative for visual disturbance.  Respiratory: Negative for cough and shortness of breath.   Cardiovascular: Negative for chest pain and leg swelling.  Gastrointestinal: Positive for abdominal pain and nausea. Negative for diarrhea and vomiting.  Genitourinary: Positive for flank pain. Negative for dysuria.  Musculoskeletal: Negative for back pain and neck pain.  Skin: Negative for rash.  Neurological: Negative for dizziness, light-headedness and headaches.  Hematological: Does not bruise/bleed easily.  Psychiatric/Behavioral: Negative for confusion.    Physical Exam Updated Vital Signs BP (!) 185/117 (BP Location: Right Arm)   Pulse 85   Temp 98.3 F (36.8 C) (Oral)   Resp 16   Ht 1.575 m (5\' 2" )   Wt 79.4 kg   SpO2 100%   BMI 32.01 kg/m   Physical Exam Vitals and nursing note reviewed.  Constitutional:      General: She is not in acute distress.    Appearance: Normal appearance. She is well-developed.  HENT:     Head: Normocephalic and atraumatic.  Eyes:     Extraocular Movements: Extraocular movements intact.     Conjunctiva/sclera: Conjunctivae normal.     Pupils: Pupils are equal, round, and reactive to light.  Cardiovascular:     Rate and Rhythm: Normal rate and regular rhythm.     Heart sounds: No murmur.  Pulmonary:     Effort: Pulmonary effort is normal. No respiratory distress.     Breath sounds: Normal breath sounds.  Abdominal:     Palpations: Abdomen is soft.      Tenderness: There is abdominal tenderness.     Comments: Tenderness to palpation right lower quadrant.  Some coming in from the lateral aspect of the mid abdomen as well.  No distinct guarding.  Musculoskeletal:        General: Normal range of motion.     Cervical back: Normal range of motion and neck supple.  Skin:    General: Skin is warm and dry.  Neurological:     General: No  focal deficit present.     Mental Status: She is alert and oriented to person, place, and time.     ED Results / Procedures / Treatments   Labs (all labs ordered are listed, but only abnormal results are displayed) Labs Reviewed  URINALYSIS, ROUTINE W REFLEX MICROSCOPIC - Abnormal; Notable for the following components:      Result Value   APPearance HAZY (*)    All other components within normal limits  LIPASE, BLOOD  COMPREHENSIVE METABOLIC PANEL  CBC  POC URINE PREG, ED    EKG None  Radiology DG Chest 2 View  Result Date: 09/16/2019 CLINICAL DATA:  Right upper quadrant pain EXAM: CHEST - 2 VIEW COMPARISON:  01/24/2018 FINDINGS: The heart size and mediastinal contours are within normal limits. Both lungs are clear. The visualized skeletal structures are unremarkable. IMPRESSION: No active cardiopulmonary disease. Electronically Signed   By: Inez Catalina M.D.   On: 09/16/2019 12:27   CT Renal Stone Study  Result Date: 09/16/2019 CLINICAL DATA:  Acute right flank pain. EXAM: CT ABDOMEN AND PELVIS WITHOUT CONTRAST TECHNIQUE: Multidetector CT imaging of the abdomen and pelvis was performed following the standard protocol without IV contrast. COMPARISON:  January 24, 2018. FINDINGS: Lower chest: No acute abnormality. Hepatobiliary: No focal liver abnormality is seen. No gallstones, gallbladder wall thickening, or biliary dilatation. Pancreas: Unremarkable. No pancreatic ductal dilatation or surrounding inflammatory changes. Spleen: Normal in size without focal abnormality. Adrenals/Urinary Tract: Adrenal glands  are unremarkable. Kidneys are normal, without renal calculi, focal lesion, or hydronephrosis. Bladder is unremarkable. Stomach/Bowel: The stomach appears normal. There is no evidence of bowel obstruction. The appendix is retrocecal in position. It measures approximately 9 mm in its distal portion in maximum thickness, which is enlarged compared to prior exam, without definite surrounding inflammation, which is equivocal for appendicitis. Correlation with laboratory and clinical findings is recommended. Vascular/Lymphatic: No significant vascular findings are present. No enlarged abdominal or pelvic lymph nodes. Reproductive: Uterus and bilateral adnexa are unremarkable. Other: No abdominal wall hernia or abnormality. No abdominopelvic ascites. Musculoskeletal: No acute or significant osseous findings. IMPRESSION: 1. The appendix is retrocecal in position. It measures approximately 9 mm in its distal portion in maximum thickness, which is enlarged compared to prior exam, without definite surrounding inflammation, which is equivocal for appendicitis. Correlation with laboratory and clinical findings is recommended. 2. No other abnormality seen in the abdomen or pelvis. Electronically Signed   By: Marijo Conception M.D.   On: 09/16/2019 12:17    Procedures Procedures (including critical care time)  Medications Ordered in ED Medications - No data to display  ED Course  I have reviewed the triage vital signs and the nursing notes.  Pertinent labs & imaging results that were available during my care of the patient were reviewed by me and considered in my medical decision making (see chart for details).    MDM Rules/Calculators/A&P                      Patient's urinalysis normal.  Labs normal.  No leukocytosis.  But CT shows evidence of a retrocecal appendix that is enlarged.  Not able to see any distinct inflammation.  But patient is tender in the appropriate location that this may be  appendicitis.   We will discuss with the general surgeon.  Pregnancy test is negative.    In addition patient has longstanding hypertension.  Is been a little noncompliant with her blood pressure medicines.  Here  she is 185/117.  We will go ahead and give something for the pain.  Also patient pharmacy tech is checking with her pharmacies to see what medication she is supposed to be on.    Final Clinical Impression(s) / ED Diagnoses Final diagnoses:  Right lower quadrant abdominal pain  Essential hypertension    Rx / DC Orders ED Discharge Orders    None       Vanetta Mulders, MD 09/16/19 1253    Vanetta Mulders, MD 09/16/19 1253

## 2019-09-16 NOTE — H&P (Signed)
[facility] History and Physical  Reason for Referral: Early acute appendicitis  Referring Physician:  Dr. Deretha Emory, MD   Chief Complaint    Abdominal Pain      Andrea Burns is a 34 y.o. female.  HPI: Andrea Burns is a 34 yo with HTN who is not on medication as this time as she says her BP has been improved who comes in with RLQ pain and right flank pain that started last night and is associated with some minor nausea. She says that she has never had pain like this before. She is regularly constipated and has a BM every 3-4 days and it is hard, but otherwise she has no major bowel issues. She has had a migration of an IUD in the past and had a laparoscopic retrieval after it migrated.  She says that she has a monogamous relationship with her husband and denies any odor but has some minor discharge at times. She does not have a period now due to her Implanon implant per her report.  She works as a Lawyer at [employer] and is regularly tested for COVID and was negative yesterday.  She says she has gained some weight in the last year due to depression and anxiety. She has a Gyn that she sees regularly.   Past Medical History:  Diagnosis Date  . Anxiety   . Depression   . HA (headache)   . Medical history non-contributory     Past Surgical History:  Procedure Laterality Date  . COLPOSCOPY W/ BIOPSY / CURETTAGE    . IUD REMOVAL    . LAPAROSCOPY ABDOMEN DIAGNOSTIC     Removal of migrated IUD   . NO PAST SURGERIES      Family History  Problem Relation Age of Onset  . Diabetes Mother   . Hypertension Mother   . Cancer Mother   . Diabetes Father   . Cancer Paternal Grandmother        liver & lung    Social History   Tobacco Use  . Smoking status: Former Smoker    Packs/day: 0.25    Years: 1.00    Pack years: 0.25    Quit date: 05/03/2005    Years since quitting: 14.3  . Smokeless tobacco: Never Used  Substance Use Topics  . Alcohol use: No   Alcohol/week: 0.0 standard drinks  . Drug use: Not Currently    Types: Cocaine    Medications: I have reviewed the patient's current medications. Current Facility-Administered Medications  Medication Dose Route Frequency Provider Last Rate Last Admin  . 0.9 %  sodium chloride infusion   Intravenous Continuous Vanetta Mulders, MD 100 mL/hr at 09/16/19 1347 New Bag at 09/16/19 1347  . Chlorhexidine Gluconate Cloth 2 % PADS 6 each  6 each Topical Once [name], MD      . Melene Muller ON 09/17/2019] ertapenem [facility]) 1,000 mg in sodium chloride 0.9 % 100 mL IVPB  1 g Intravenous On Call to OR [name], MD      . labetalol (NORMODYNE) injection 10 mg  10 mg Intravenous Once [name], MD       Current Outpatient Medications  Medication Sig Dispense Refill Last Dose  . amLODipine (NORVASC) 5 MG tablet Take 1 tablet (5 mg total) by mouth daily after breakfast. 30 tablet 11   . carvedilol (COREG) 12.5 MG tablet Take 1 tablet (12.5 mg total) by mouth 2 (two) times daily with a meal.  60 tablet 11   . Drospirenone (SLYND) 4 MG TABS Take 1 tablet by mouth daily. 28 tablet 11 09/16/2019 at Unknown time  . escitalopram (LEXAPRO) 5 MG tablet Take 5 mg by mouth daily.   Past Week at Unknown time  . phentermine 37.5 MG capsule Take 37.5 mg by mouth every morning.   09/16/2019 at Unknown time  . albuterol (PROVENTIL HFA;VENTOLIN HFA) 108 (90 Base) MCG/ACT inhaler Inhale 2 puffs into the lungs every 6 (six) hours as needed for wheezing or shortness of breath. (Patient not taking: Reported on 09/10/2018) 1 Inhaler 2 Not Taking at Unknown time  . cephALEXin (KEFLEX) 500 MG capsule Take 1 capsule (500 mg total) by mouth 4 (four) times daily. (Patient not taking: Reported on 08/27/2018) 40 capsule 0 Completed Course at Unknown time  . levonorgestrel-ethinyl estradiol (NORDETTE) 0.15-30 MG-MCG tablet Take 1 tablet by mouth daily. (Patient not taking: Reported on 09/10/2018) 1 Package 0 Not Taking at  Unknown time  . lisinopril-hydrochlorothiazide (ZESTORETIC) 10-12.5 MG tablet Take 1 tablet by mouth daily.     Marland Kitchen PAZEO 0.7 % SOLN Place 1 drop into both eyes daily.  3 Completed Course at Unknown time  . RESTASIS MULTIDOSE 0.05 % ophthalmic emulsion Place 1 drop into both eyes daily.  3 Completed Course at Unknown time  . triamterene-hydrochlorothiazide (DYAZIDE) 37.5-25 MG capsule Take 1 each (1 capsule total) by mouth daily after breakfast. (Patient not taking: Reported on 09/16/2019) 30 capsule 11 Completed Course at Unknown time   No Known Allergies  ROS:  A comprehensive review of systems was negative except for: Gastrointestinal: positive for abdominal pain, constipation and nausea Right flank pain radiating down to right lower abdomen   Blood pressure (!) 176/114, pulse 77, temperature 98.3 F (36.8 C), temperature source Oral, resp. rate 16, height 5\' 2"  (1.575 m), weight 79.4 kg, SpO2 98 %. Physical Exam Vitals reviewed.  Constitutional:      Appearance: She is well-developed and normal weight.  HENT:     Head: Normocephalic.  Eyes:     Extraocular Movements: Extraocular movements intact.  Cardiovascular:     Rate and Rhythm: Normal rate and regular rhythm.  Pulmonary:     Effort: Pulmonary effort is normal.  Abdominal:     Palpations: Abdomen is soft.     Tenderness: There is abdominal tenderness in the right lower quadrant.     Hernia: There is no hernia in the umbilical area.     Comments: Right flank tenderness  Musculoskeletal:     Comments: Moves all extremities   Skin:    General: Skin is warm and dry.  Neurological:     General: No focal deficit present.     Mental Status: She is alert and oriented to person, place, and time.  Psychiatric:        Mood and Affect: Mood normal.        Behavior: Behavior normal.     Results: Results for orders placed or performed during the hospital encounter of 09/16/19 (from the past 48 hour(s))  Lipase, blood     Status:  None   Collection Time: 09/16/19 11:08 AM  Result Value Ref Range   Lipase 40 11 - 51 U/L    Comment: Performed at [facility], [address], [place], [place] [address]  Comprehensive metabolic panel     Status: None   Collection Time: 09/16/19 11:08 AM  Result Value Ref Range   Sodium 135 135 - 145 mmol/L  Potassium 3.7 3.5 - 5.1 mmol/L   Chloride 101 98 - 111 mmol/L   CO2 25 22 - 32 mmol/L   Glucose, Bld 95 70 - 99 mg/dL    Comment: Glucose reference range applies only to samples taken after fasting for at least 8 hours.   BUN 13 6 - 20 mg/dL   Creatinine, Ser 9.37 0.44 - 1.00 mg/dL   Calcium 9.7 8.9 - 16.9 mg/dL   Total Protein 7.6 6.5 - 8.1 g/dL   Albumin 4.6 3.5 - 5.0 g/dL   AST 26 15 - 41 U/L   ALT 29 0 - 44 U/L   Alkaline Phosphatase 68 38 - 126 U/L   Total Bilirubin 0.6 0.3 - 1.2 mg/dL   GFR calc non Af Amer >60 >60 mL/min   GFR calc Af Amer >60 >60 mL/min   Anion gap 9 5 - 15    Comment: Performed at [facility], [address], [place], [place] [address]  CBC     Status: None   Collection Time: 09/16/19 11:08 AM  Result Value Ref Range   WBC 9.1 4.0 - 10.5 K/uL   RBC 4.78 3.87 - 5.11 MIL/uL   Hemoglobin 14.4 12.0 - 15.0 g/dL   HCT 81.0 17.5 - 10.2 %   MCV 88.1 80.0 - 100.0 fL   MCH 30.1 26.0 - 34.0 pg   MCHC 34.2 30.0 - 36.0 g/dL   RDW 58.5 27.7 - 82.4 %   Platelets 285 150 - 400 K/uL   nRBC 0.0 0.0 - 0.2 %    Comment: Performed at [facility], [address] [place], [place] [address]  Differential     Status: None   Collection Time: 09/16/19 11:08 AM  Result Value Ref Range   Neutrophils Relative % 69 %   Neutro Abs 6.3 1.7 - 7.7 K/uL   Lymphocytes Relative 24 %   Lymphs Abs 2.2 0.7 - 4.0 K/uL   Monocytes Relative 6 %   Monocytes Absolute 0.5 0.1 - 1.0 K/uL   Eosinophils Relative 1 %   Eosinophils Absolute 0.1 0.0 - 0.5 K/uL   Basophils Relative 0 %   Basophils Absolute 0.0 0.0 - 0.1 K/uL   Immature Granulocytes 0 %   Abs Immature Granulocytes  0.01 0.00 - 0.07 K/uL    Comment: Performed at [facility], [address] [place], [place] [address]  POC urine preg, ED     Status: None   Collection Time: 09/16/19 11:16 AM  Result Value Ref Range   Preg Test, Ur NEGATIVE NEGATIVE    Comment:        THE SENSITIVITY OF THIS METHODOLOGY IS >24 mIU/mL   Urinalysis, Routine w reflex microscopic     Status: Abnormal   Collection Time: 09/16/19 11:17 AM  Result Value Ref Range   Color, Urine YELLOW YELLOW   APPearance HAZY (A) CLEAR   Specific Gravity, Urine 1.012 1.005 - 1.030   pH 7.0 5.0 - 8.0   Glucose, UA NEGATIVE NEGATIVE mg/dL   Hgb urine dipstick NEGATIVE NEGATIVE   Bilirubin Urine NEGATIVE NEGATIVE   Ketones, ur NEGATIVE NEGATIVE mg/dL   Protein, ur NEGATIVE NEGATIVE mg/dL   Nitrite NEGATIVE NEGATIVE   Leukocytes,Ua NEGATIVE NEGATIVE    Comment: Performed at [facility], [address], [place], [place] [address]   Personally reviewed CT and reviewed with Dr. Tyron Russell Radiology dilated appendix with thickened walls today, possibly some haziness at tip, larger by about 3-26mm today compared  to last CT in 2019  DG Chest 2 View  Result Date: 09/16/2019 CLINICAL DATA:  Right upper quadrant pain EXAM: CHEST - 2 VIEW COMPARISON:  01/24/2018 FINDINGS: The heart size and mediastinal contours are within normal limits. Both lungs are clear. The visualized skeletal structures are unremarkable. IMPRESSION: No active cardiopulmonary disease. Electronically Signed   By: Alcide Clever M.D.   On: 09/16/2019 12:27   CT Renal Stone Study  Result Date: 09/16/2019 CLINICAL DATA:  Acute right flank pain. EXAM: CT ABDOMEN AND PELVIS WITHOUT CONTRAST TECHNIQUE: Multidetector CT imaging of the abdomen and pelvis was performed following the standard protocol without IV contrast. COMPARISON:  January 24, 2018. FINDINGS: Lower chest: No acute abnormality. Hepatobiliary: No focal liver abnormality is seen. No gallstones, gallbladder wall thickening, or biliary  dilatation. Pancreas: Unremarkable. No pancreatic ductal dilatation or surrounding inflammatory changes. Spleen: Normal in size without focal abnormality. Adrenals/Urinary Tract: Adrenal glands are unremarkable. Kidneys are normal, without renal calculi, focal lesion, or hydronephrosis. Bladder is unremarkable. Stomach/Bowel: The stomach appears normal. There is no evidence of bowel obstruction. The appendix is retrocecal in position. It measures approximately 9 mm in its distal portion in maximum thickness, which is enlarged compared to prior exam, without definite surrounding inflammation, which is equivocal for appendicitis. Correlation with laboratory and clinical findings is recommended. Vascular/Lymphatic: No significant vascular findings are present. No enlarged abdominal or pelvic lymph nodes. Reproductive: Uterus and bilateral adnexa are unremarkable. Other: No abdominal wall hernia or abnormality. No abdominopelvic ascites. Musculoskeletal: No acute or significant osseous findings. IMPRESSION: 1. The appendix is retrocecal in position. It measures approximately 9 mm in its distal portion in maximum thickness, which is enlarged compared to prior exam, without definite surrounding inflammation, which is equivocal for appendicitis. Correlation with laboratory and clinical findings is recommended. 2. No other abnormality seen in the abdomen or pelvis. Electronically Signed   By: Lupita Raider M.D.   On: 09/16/2019 12:17    Assessment & Plan:  Andrea Burns is a 34 y.o. female with what appears to be early dilated appendicitis. We discussed that her appendix is larger but that it could be completely normal and without inflammation. We discussed the options of laparoscopic appendectomy versus po trial and home versus observation in the hospital and discussion of surgery tomorrow. We discussed that if we do surgery we will remove the appendix even if it is normal. We discussed the risk of surgery  including but not limited to bleeding, infection, injury to other organs, removal of normal appendix, and she opted to proceed with surgery.   -Rapid COVID done -EKG with sinus rhythm -Labetalol given for BP -Will likely go home after surgery -Will plan to be out of work until 4/19 given her job   All questions were answered to the satisfaction of the patient.   [name] 09/16/2019, 2:59 PM

## 2019-09-16 NOTE — Discharge Instructions (Signed)
Stop Phentermine for now and do not take until we discuss surgery. Take your antibiotic as prescribed.  In [place] appendicitis is treated with antibiotics and people succeed with this treatment 60% of the time.  Take zofran for nausea if needed. Diet as tolerated. Tylenol and ibuprofen for pain and Roxicodone for breakthrough pain.  Take your Blood pressure medication as your blood pressure was high. Check your blood pressure daily.   I will see you Tuesday @ 9:15Am at my clinic to check on you and see how you are doing.  Return to the ED with worsening pain not controlled with medication, nausea/vomiting, or high fevers > 101.    Appendicitis, Adult  The appendix is a tube in the body that is shaped like a finger. It is attached to the large intestine. Appendicitis means that this tube is swollen (inflamed). If this is not treated, the tube can tear (rupture). This can lead to a life-threatening infection. This condition can also cause pus to build up in the appendix (abscess). What are the causes? This condition may be caused by something that blocks the appendix. These include:  A ball of poop (stool).  Lymph glands that are bigger than normal. Sometimes the cause is not known. What increases the risk? You are more likely to develop this condition if you are between 44 and 56 years of age. What are the signs or symptoms? Symptoms of this condition include:  Pain around the belly button (navel). ? The pain moves toward the lower right belly (abdomen). ? The pain can get worse with time. ? The pain can get worse if you cough. ? The pain can get worse if you move suddenly.  Tenderness in the lower right belly.  Feeling sick to your stomach (nauseous).  Throwing up (vomiting).  Not feeling hungry (loss of appetite).  A fever.  Having trouble pooping (constipation).  Watery poop (diarrhea).  Not feeling well. How is this treated? Most often, this condition is treated  by taking out the appendix (appendectomy). There are two ways to do this:  Open surgery. For this method, the appendix is taken out through a large cut (incision). The cut is made in the lower right belly. This surgery may be used if: ? You have scars from another surgery. ? You have a bleeding condition. ? You are pregnant and will be having your baby soon. ? You have a condition that makes it hard to do the other type of surgery.  Laparoscopic surgery. For this method, the appendix is taken out through small cuts. Often, this surgery: ? Causes less pain. ? Causes fewer problems. ? Is easier to heal from. If your appendix tears and pus forms:  A drain may be put into the sore. The drain will be used to get rid of the pus.  You may get an antibiotic medicine through an IV line.  Your appendix may or may not need to be taken out. Follow these instructions at home: If you had surgery, follow instructions from your doctor on how to care for yourself at home and how to take care of your cut from surgery. Medicines  Take over-the-counter and prescription medicines only as told by your doctor.  If you were prescribed an antibiotic medicine, take it as told by your doctor. Do not stop taking the antibiotic even if you start to feel better. Eating and drinking Follow instructions from your doctor about what you cannot eat or drink. You may go back  to your diet slowly if:  You no longer feel sick to your stomach.  You have stopped throwing up. General instructions  Do not use any products that contain nicotine or tobacco, such as cigarettes, e-cigarettes, and chewing tobacco. If you need help quitting, ask your doctor.  Do not drive or use heavy machinery while taking prescription pain medicine.  Ask your doctor if the medicine you are taking can cause trouble pooping. You may need to take steps to prevent or treat trouble pooping: ? Drink enough fluid to keep your pee (urine) pale  yellow. ? Take over-the-counter or prescription medicines. ? Eat foods that are high in fiber. These include beans, whole grains, and fresh fruits and vegetables. ? Limit foods that are high in fat and sugar. These include fried or sweet foods.  Keep all follow-up visits as told by your doctor. This is important. Contact a doctor if:  There is pus, blood, or a lot of fluid coming from your cut or cuts from surgery.  You are sick to your stomach or you throw up. Get help right away if:  You have pain in your belly, and the pain is getting worse.  You have a fever.  You have chills.  You are very tired.  You have muscle pain.  You are short of breath. Summary  Appendicitis is swelling of the appendix. The appendix is a tube that is shaped like a finger. It is joined to the large intestine.  This condition may be caused by something that blocks the appendix. This can lead to an infection.  This condition is usually treated by taking out the appendix. This information is not intended to replace advice given to you by your health care provider. Make sure you discuss any questions you have with your health care provider. Document Revised: 11/12/2017 Document Reviewed: 11/12/2017 Elsevier Patient Education  [employer].    Hypertension, Adult Hypertension is another name for high blood pressure. High blood pressure forces your heart to work harder to pump blood. This can cause problems over time. There are two numbers in a blood pressure reading. There is a top number (systolic) over a bottom number (diastolic). It is best to have a blood pressure that is below 120/80. Healthy choices can help lower your blood pressure, or you may need medicine to help lower it. What are the causes? The cause of this condition is not known. Some conditions may be related to high blood pressure. What increases the risk?  Smoking.  Having type 2 diabetes mellitus, high cholesterol, or  both.  Not getting enough exercise or physical activity.  Being overweight.  Having too much fat, sugar, calories, or salt (sodium) in your diet.  Drinking too much alcohol.  Having long-term (chronic) kidney disease.  Having a family history of high blood pressure.  Age. Risk increases with age.  Race. You may be at higher risk if you are African American.  Gender. Men are at higher risk than women before age 70. After age 74, women are at higher risk than men.  Having obstructive sleep apnea.  Stress. What are the signs or symptoms?  High blood pressure may not cause symptoms. Very high blood pressure (hypertensive crisis) may cause: ? Headache. ? Feelings of worry or nervousness (anxiety). ? Shortness of breath. ? Nosebleed. ? A feeling of being sick to your stomach (nausea). ? Throwing up (vomiting). ? Changes in how you see. ? Very bad chest pain. ? Seizures. How  is this treated?  This condition is treated by making healthy lifestyle changes, such as: ? Eating healthy foods. ? Exercising more. ? Drinking less alcohol.  Your health care provider may prescribe medicine if lifestyle changes are not enough to get your blood pressure under control, and if: ? Your top number is above 130. ? Your bottom number is above 80.  Your personal target blood pressure may vary. Follow these instructions at home: Eating and drinking   If told, follow the DASH eating plan. To follow this plan: ? Fill one half of your plate at each meal with fruits and vegetables. ? Fill one fourth of your plate at each meal with whole grains. Whole grains include whole-wheat pasta, brown rice, and whole-grain bread. ? Eat or drink low-fat dairy products, such as skim milk or low-fat yogurt. ? Fill one fourth of your plate at each meal with low-fat (lean) proteins. Low-fat proteins include fish, chicken without skin, eggs, beans, and tofu. ? Avoid fatty meat, cured and processed meat, or  chicken with skin. ? Avoid pre-made or processed food.  Eat less than 1,500 mg of salt each day.  Do not drink alcohol if: ? Your doctor tells you not to drink. ? You are pregnant, may be pregnant, or are planning to become pregnant.  If you drink alcohol: ? Limit how much you use to:  0-1 drink a day for women.  0-2 drinks a day for men. ? Be aware of how much alcohol is in your drink. In the [place] one drink equals one 12 oz bottle of beer (355 mL), one 5 oz glass of wine (148 mL), or one 1 oz glass of hard liquor (44 mL). Lifestyle   Work with your doctor to stay at a healthy weight or to lose weight. Ask your doctor what the best weight is for you.  Get at least 30 minutes of exercise most days of the week. This may include walking, swimming, or biking.  Get at least 30 minutes of exercise that strengthens your muscles (resistance exercise) at least 3 days a week. This may include lifting weights or doing Pilates.  Do not use any products that contain nicotine or tobacco, such as cigarettes, e-cigarettes, and chewing tobacco. If you need help quitting, ask your doctor.  Check your blood pressure at home as told by your doctor.  Keep all follow-up visits as told by your doctor. This is important. Medicines  Take over-the-counter and prescription medicines only as told by your doctor. Follow directions carefully.  Do not skip doses of blood pressure medicine. The medicine does not work as well if you skip doses. Skipping doses also puts you at risk for problems.  Ask your doctor about side effects or reactions to medicines that you should watch for. Contact a doctor if you:  Think you are having a reaction to the medicine you are taking.  Have headaches that keep coming back (recurring).  Feel dizzy.  Have swelling in your ankles.  Have trouble with your vision. Get help right away if you:  Get a very bad headache.  Start to feel mixed up (confused).  Feel weak  or numb.  Feel faint.  Have very bad pain in your: ? Chest. ? Belly (abdomen).  Throw up more than once.  Have trouble breathing. Summary  Hypertension is another name for high blood pressure.  High blood pressure forces your heart to work harder to pump blood.  For most people, a normal  blood pressure is less than 120/80.  Making healthy choices can help lower blood pressure. If your blood pressure does not get lower with healthy choices, you may need to take medicine. This information is not intended to replace advice given to you by your health care provider. Make sure you discuss any questions you have with your health care provider. Document Revised: 02/04/2018 Document Reviewed: 02/04/2018 Elsevier Patient Education  2020 Elsevier Inc.  

## 2019-09-16 NOTE — Progress Notes (Signed)
Assumed care of patient at 59. Pt resting. Nad. A/o. No obvious signs or symptoms of pain noted. Dr. Henreitta Leber talking with pt now. Able to d/c patient after antibiotics finished. invanz iv in progress.

## 2019-09-16 NOTE — ED Triage Notes (Signed)
Pt reports right lower abdominal pain that radiates to right back. Pain is sharp. Nauseated. Pt reports BP is high and has not taken lisinopril for one week

## 2019-09-16 NOTE — Anesthesia Preprocedure Evaluation (Deleted)
Anesthesia Evaluation  Patient identified by MRN, date of birth, ID band Patient awake    Reviewed: Allergy & Precautions, NPO status , Patient's Chart, lab work & pertinent test results  Airway Mallampati: II  TM Distance: >3 FB Neck ROM: Full    Dental   Pulmonary pneumonia, resolved, former smoker,           Cardiovascular Exercise Tolerance: Good hypertension (patient's blood pressure high in ER, Received labetalol10 mg), Pt. on medications      Neuro/Psych  Headaches, PSYCHIATRIC DISORDERS Anxiety Depression Restless leg syndrome    GI/Hepatic GERD  Medicated,(+)     substance abuse (last took yesterday)  cocaine use, Nausea, abdominal pain   Endo/Other  negative endocrine ROS  Renal/GU negative Renal ROS     Musculoskeletal   Abdominal   Peds negative pediatric ROS (+)  Hematology negative hematology ROS (+)   Anesthesia Other Findings Patient is on weight losing drug, took phentermine this morning. Risks discussed with patient and Dr. Henreitta Leber. Procedure will be rescheduled after optimizing her medical condition.  Reproductive/Obstetrics                          Anesthesia Physical Anesthesia Plan  ASA: III  Anesthesia Plan:    Post-op Pain Management:    Induction:   PONV Risk Score and Plan: 4 or greater  Airway Management Planned:   Additional Equipment:   Intra-op Plan:   Post-operative Plan: Extubation in OR  Informed Consent:     Dental advisory given  Plan Discussed with: CRNA and Surgeon  Anesthesia Plan Comments:        Anesthesia Quick Evaluation

## 2019-09-21 ENCOUNTER — Ambulatory Visit (INDEPENDENT_AMBULATORY_CARE_PROVIDER_SITE_OTHER): Payer: Self-pay | Admitting: General Surgery

## 2019-09-21 ENCOUNTER — Encounter: Payer: Self-pay | Admitting: General Surgery

## 2019-09-21 ENCOUNTER — Other Ambulatory Visit: Payer: Self-pay

## 2019-09-21 VITALS — BP 169/117 | HR 69 | Temp 97.9°F | Resp 12 | Ht 62.0 in | Wt 181.0 lb

## 2019-09-21 DIAGNOSIS — K3580 Unspecified acute appendicitis: Secondary | ICD-10-CM

## 2019-09-21 MED ORDER — OXYCODONE HCL 5 MG PO TABS: 5.0000 mg | ORAL_TABLET | ORAL | 0 refills | Status: DC | PRN

## 2019-09-21 NOTE — Progress Notes (Signed)
[facility] Note   HPI:  34 y.o. Female presents to clinic for follow-up evaluation after being seen for acute appendicitis but unable to have surgery due to phentermine. She is doing fair. She is on antibiotics and has some soreness in the right lower quadrant. she is having Bms. She has some nausea but no vomiting. She says she is not eating a lot but is staying hydrated. She has plans to travel to [place] later this week.   Review of Systems:  No fever or chills Soreness RLQ  All other review of systems: otherwise negative   Vital Signs:  BP (!) 169/117   Pulse 69   Temp 97.9 F (36.6 C) (Oral)   Resp 12   Ht 5\' 2"  (1.575 m)   Wt 181 lb (82.1 kg)   SpO2 99%   BMI 33.11 kg/m    Physical Exam:  Physical Exam Vitals reviewed.  Constitutional:      Appearance: She is normal weight.  HENT:     Head: Normocephalic.  Cardiovascular:     Rate and Rhythm: Normal rate.  Pulmonary:     Effort: Pulmonary effort is normal.  Abdominal:     General: There is no distension.     Palpations: Abdomen is soft.     Tenderness: There is abdominal tenderness in the right lower quadrant.  Skin:    General: Skin is warm and dry.  Neurological:     General: No focal deficit present.     Mental Status: She is alert and oriented to person, place, and time.     Assessment:  34 y.o. yo Female with acute appendicitis that we are treating with antibiotics given her taking phentermine and need to avoid surgery due to issues with arrhythmias/ BP fluctuations.   Plan:  Take antibiotic as prescribed. Pain medication as needed. Use ibuprofen and tylenol and narcotic for break through pain.  Call or Go to the ED with worsening fever, pain or inability to tolerate oral intake.  Hold the phenteramine.  Make sure you take breaks every few hours during your trip.  Ok to return to work next week  Plan for laparoscopic appendectomy in 4-6 weeks after inflammation resolves.   Future  Appointments  Date Time Provider Department Center  10/07/2019  1:00 PM 10/09/2019, MD [facility] None     All of the above recommendations were discussed with the patient, and all of patient's questions were answered to her expressed satisfaction.  [name], MD [facility] [address] [place], [place] [phone] (office)

## 2019-09-21 NOTE — Patient Instructions (Addendum)
Take antibiotic as prescribed. Pain medication as needed. Use ibuprofen and tylenol and narcotic for break through pain.  Call or Go to the ED with worsening fever, pain or inability to tolerate oral intake.  Hold the phenteramine.  Plan for laparoscopic appendectomy in about 4 weeks.  Make sure you take breaks every few hours during your trip.

## 2019-10-05 ENCOUNTER — Encounter (HOSPITAL_COMMUNITY): Payer: Self-pay | Admitting: *Deleted

## 2019-10-05 ENCOUNTER — Other Ambulatory Visit: Payer: Self-pay

## 2019-10-05 ENCOUNTER — Emergency Department (HOSPITAL_COMMUNITY): Payer: Medicaid Other

## 2019-10-05 ENCOUNTER — Emergency Department (HOSPITAL_COMMUNITY)
Admission: EM | Admit: 2019-10-05 | Discharge: 2019-10-06 | Disposition: A | Discharge status: Home / Self Care | Payer: Medicaid Other | Attending: Emergency Medicine | Admitting: Emergency Medicine

## 2019-10-05 DIAGNOSIS — F1410 Cocaine abuse, uncomplicated: Secondary | ICD-10-CM | POA: Insufficient documentation

## 2019-10-05 DIAGNOSIS — R1084 Generalized abdominal pain: Secondary | ICD-10-CM | POA: Insufficient documentation

## 2019-10-05 DIAGNOSIS — Z87891 Personal history of nicotine dependence: Secondary | ICD-10-CM | POA: Insufficient documentation

## 2019-10-05 DIAGNOSIS — K5900 Constipation, unspecified: Secondary | ICD-10-CM | POA: Insufficient documentation

## 2019-10-05 DIAGNOSIS — Z79899 Other long term (current) drug therapy: Secondary | ICD-10-CM | POA: Insufficient documentation

## 2019-10-05 DIAGNOSIS — Z20822 Contact with and (suspected) exposure to covid-19: Secondary | ICD-10-CM | POA: Insufficient documentation

## 2019-10-05 MED ORDER — FENTANYL CITRATE (PF) 100 MCG/2ML IJ SOLN
50.0000 ug | Freq: Once | INTRAMUSCULAR | Status: AC
  Administered 2019-10-06: 50 ug via INTRAVENOUS
  Filled 2019-10-05: qty 2

## 2019-10-05 MED ORDER — ONDANSETRON HCL 4 MG/2ML IJ SOLN
4.0000 mg | Freq: Once | INTRAMUSCULAR | Status: AC
  Administered 2019-10-06: 4 mg via INTRAVENOUS
  Filled 2019-10-05: qty 2

## 2019-10-05 MED ORDER — SODIUM CHLORIDE 0.9 % IV BOLUS
1000.0000 mL | Freq: Once | INTRAVENOUS | Status: AC
  Administered 2019-10-06: 1000 mL via INTRAVENOUS

## 2019-10-05 NOTE — ED Triage Notes (Addendum)
Pt with appendicitis and unable to have surgery due to medication.  Pt c/o HA since this morning.  Nausea. Pt states she has to be off the medication Phentermine for weeks to be able to have surgery.  Pt states she is unable to take the pain.

## 2019-10-05 NOTE — ED Provider Notes (Signed)
[facility] EMERGENCY DEPARTMENT Provider Note   CSN: [id number] Arrival date & time: 10/05/19  2030   Time seen 11:25 PM  History Chief Complaint  Patient presents with  . Abdominal Pain    Andrea Burns is a 34 y.o. female.  HPI   Patient states she was seen in the ED on April 8 and diagnosed with appendicitis.  She was going to have surgery however she had been on phentermine and they elected to treat her with antibiotics for about 3 weeks due to blood pressure instability because of phentermine.  She was seen by the surgeon on 413.  She was doing okay.  She had been prescribed Augmentin to take for 10 days.  She states her next appointment is on April 29.  She states she started getting worsening pain on the 26.  She has had nausea and vomited twice today.  She denies diarrhea but states she is constipated and she had to force herself to have a BM this morning that was hard but without balls.  She denies that straining makes her pain worse.  She denies fever but states she was having cold sweats today.  She states she feels tired and has no strength.  She also states she is having some left upper and left lower chest pain today.  She is coughing up green sputum and having rhinorrhea but denies sore throat.  She states she has had pneumonia before and this feels the same.  She denies being around anybody with Covid or taking the Covid vaccine.  PCP Patient, No Pcp Per   Past Medical History:  Diagnosis Date  . Anxiety   . Depression   . GERD (gastroesophageal reflux disease)   . HA (headache)   . 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 Medical history non-contributory   . Pneumonia    2013    Patient Active Problem List   Diagnosis Date Noted  . Acute appendicitis   . Restless leg 12/24/2013  . HA (headache)   . Urinary tract infection, site not specified 11/08/2013  . Migraine headache with aura 11/08/2013  . Abnormal uterine  bleeding (AUB) 11/08/2013  . Gastroenteritis, acute 11/08/2013  . Postpartum depression 08/12/2013  . Depressive disorder 08/12/2013  . Active labor 05/10/2013  . Normal delivery 05/10/2013  . Rapid first stage of labor 05/10/2013  . Abnormal genetic test in pregnancy 12/14/2012  . Papanicolaou smear of cervix with low grade squamous intraepithelial lesion (LGSIL) 10/19/2012  . Asymptomatic bacteriuria in pregnancy 09/21/2012  . 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 Supervision of other normal pregnancy 09/17/2012    Past Surgical History:  Procedure Laterality Date  . COLPOSCOPY W/ BIOPSY / CURETTAGE    . IUD REMOVAL    . LAPAROSCOPY ABDOMEN DIAGNOSTIC     Removal of migrated IUD   . NO PAST SURGERIES       OB History    Gravida  5   Para  4   Term  4   Preterm      AB  1   Living  4     SAB  1   TAB      Ectopic      Multiple      Live Births  4           Family History  Problem Relation Age of Onset  . Diabetes Mother   . Hypertension Mother   . Cancer Mother   .  Diabetes Father   . Cancer Paternal Grandmother        liver & lung    Social History   Tobacco Use  . Smoking status: Former Smoker    Packs/day: 0.25    Years: 1.00    Pack years: 0.25    Quit date: 05/03/2005    Years since quitting: 14.4  . Smokeless tobacco: Never Used  Substance Use Topics  . Alcohol use: No    Alcohol/week: 0.0 standard drinks  . Drug use: Not Currently    Types: Cocaine    Comment: last used 3 to 4 months ago    Home Medications Prior to Admission medications   Medication Sig Start Date End Date Taking? Authorizing Provider  docusate sodium (COLACE) 100 MG capsule Take 1 capsule (100 mg total) by mouth 2 (two) times daily. 09/16/19 09/15/20  Virl Cagey, MD  Drospirenone (SLYND) 4 MG TABS Take 1 tablet by mouth daily. Patient taking differently: Take 4 mg by mouth every evening.  10/22/18   Shelly Bombard, MD  lisinopril-hydrochlorothiazide  (ZESTORETIC) 10-12.5 MG tablet Take 1 tablet by mouth daily.    [provider]  ondansetron (ZOFRAN) 4 MG tablet Take 1 tablet (4 mg total) by mouth daily as needed for nausea or vomiting. 09/16/19 09/15/20  Virl Cagey, MD  oxyCODONE (ROXICODONE) 5 MG immediate release tablet Take 1 tablet (5 mg total) by mouth every 4 (four) hours as needed for severe pain or breakthrough pain. 09/21/19 09/20/20  Virl Cagey, MD    Allergies    Patient has no known allergies.  Review of Systems   Review of Systems  All other systems reviewed and are negative.   Physical Exam Updated Vital Signs BP (!) 147/83   Pulse 63   Temp 98 F (36.7 C) (Oral)   Resp 20   Ht 5\' 2"  (1.575 m)   Wt 81.2 kg   LMP  (LMP Unknown)   SpO2 99%   BMI 32.74 kg/m   Physical Exam Vitals and nursing note reviewed.  Constitutional:      Appearance: Normal appearance. She is obese.  HENT:     Head: Normocephalic and atraumatic.     Right Ear: External ear normal.     Left Ear: External ear normal.     Nose: Nose normal.     Mouth/Throat:     Mouth: Mucous membranes are dry.  Eyes:     Extraocular Movements: Extraocular movements intact.     Conjunctiva/sclera: Conjunctivae normal.     Pupils: Pupils are equal, round, and reactive to light.  Cardiovascular:     Rate and Rhythm: Normal rate and regular rhythm.  Pulmonary:     Effort: Pulmonary effort is normal. No respiratory distress.     Breath sounds: Normal breath sounds.  Abdominal:     General: Abdomen is flat. Bowel sounds are normal.     Palpations: Abdomen is soft.     Tenderness: There is generalized abdominal tenderness. There is no guarding or rebound.     Comments: Does not appear painful when changing positions.  She continues to talk when I palpate her abdomen without apparent distress.  Musculoskeletal:        General: Normal range of motion.     Cervical back: Normal range of motion.  Skin:    General: Skin is warm and  dry.     Findings: No erythema.  Neurological:     General: No focal  deficit present.     Mental Status: She is alert and oriented to person, place, and time.     Cranial Nerves: No cranial nerve deficit.  Psychiatric:        Mood and Affect: Mood normal.        Behavior: Behavior normal.        Thought Content: Thought content normal.     ED Results / Procedures / Treatments   Labs (all labs ordered are listed, but only abnormal results are displayed) Results for orders placed or performed during the hospital encounter of 10/05/19  Respiratory Panel by RT PCR (Flu A&B, Covid) - Nasopharyngeal Swab   Specimen: Nasopharyngeal Swab  Result Value Ref Range   SARS Coronavirus 2 by RT PCR NEGATIVE NEGATIVE   Influenza A by PCR NEGATIVE NEGATIVE   Influenza B by PCR NEGATIVE NEGATIVE  Comprehensive metabolic panel  Result Value Ref Range   Sodium 136 135 - 145 mmol/L   Potassium 3.5 3.5 - 5.1 mmol/L   Chloride 101 98 - 111 mmol/L   CO2 26 22 - 32 mmol/L   Glucose, Bld 95 70 - 99 mg/dL   BUN 11 6 - 20 mg/dL   Creatinine, Ser 4.090.91 0.44 - 1.00 mg/dL   Calcium 9.3 8.9 - 81.110.3 mg/dL   Total Protein 7.4 6.5 - 8.1 g/dL   Albumin 4.1 3.5 - 5.0 g/dL   AST 22 15 - 41 U/L   ALT 28 0 - 44 U/L   Alkaline Phosphatase 71 38 - 126 U/L   Total Bilirubin 0.4 0.3 - 1.2 mg/dL   GFR calc non Af Amer >60 >60 mL/min   GFR calc Af Amer >60 >60 mL/min   Anion gap 9 5 - 15  CBC with Differential  Result Value Ref Range   WBC 13.8 (H) 4.0 - 10.5 K/uL   RBC 4.57 3.87 - 5.11 MIL/uL   Hemoglobin 13.7 12.0 - 15.0 g/dL   HCT 91.440.6 78.236.0 - 95.646.0 %   MCV 88.8 80.0 - 100.0 fL   MCH 30.0 26.0 - 34.0 pg   MCHC 33.7 30.0 - 36.0 g/dL   RDW 21.311.9 08.611.5 - 57.815.5 %   Platelets 303 150 - 400 K/uL   nRBC 0.0 0.0 - 0.2 %   Neutrophils Relative % 67 %   Neutro Abs 9.2 (H) 1.7 - 7.7 K/uL   Lymphocytes Relative 25 %   Lymphs Abs 3.4 0.7 - 4.0 K/uL   Monocytes Relative 6 %   Monocytes Absolute 0.8 0.1 - 1.0 K/uL    Eosinophils Relative 2 %   Eosinophils Absolute 0.3 0.0 - 0.5 K/uL   Basophils Relative 0 %   Basophils Absolute 0.1 0.0 - 0.1 K/uL   Immature Granulocytes 0 %   Abs Immature Granulocytes 0.05 0.00 - 0.07 K/uL  Urinalysis, Routine w reflex microscopic  Result Value Ref Range   Color, Urine YELLOW YELLOW   APPearance HAZY (A) CLEAR   Specific Gravity, Urine 1.016 1.005 - 1.030   pH 6.0 5.0 - 8.0   Glucose, UA NEGATIVE NEGATIVE mg/dL   Hgb urine dipstick NEGATIVE NEGATIVE   Bilirubin Urine NEGATIVE NEGATIVE   Ketones, ur NEGATIVE NEGATIVE mg/dL   Protein, ur NEGATIVE NEGATIVE mg/dL   Nitrite NEGATIVE NEGATIVE   Leukocytes,Ua SMALL (A) NEGATIVE   RBC / HPF 0-5 0 - 5 RBC/hpf   WBC, UA 0-5 0 - 5 WBC/hpf   Bacteria, UA NONE SEEN NONE SEEN   Squamous Epithelial /  LPF 21-50 0 - 5   Mucus PRESENT    Hyaline Casts, UA PRESENT   Urine rapid drug screen (hosp performed)  Result Value Ref Range   Opiates NONE DETECTED NONE DETECTED   Cocaine POSITIVE (A) NONE DETECTED   Benzodiazepines NONE DETECTED NONE DETECTED   Amphetamines NONE DETECTED NONE DETECTED   Tetrahydrocannabinol NONE DETECTED NONE DETECTED   Barbiturates NONE DETECTED NONE DETECTED   Laboratory interpretation all normal except for positive UDS    EKG None  Radiology DG Chest Port 1 View  Result Date: 10/06/2019 CLINICAL DATA:  Cough and body aches EXAM: PORTABLE CHEST 1 VIEW COMPARISON:  September 16, 2019 FINDINGS: The heart size and mediastinal contours are within normal limits. Both lungs are clear. The visualized skeletal structures are unremarkable. IMPRESSION: No active disease. Electronically Signed   By: Jonna Clark M.D.   On: 10/06/2019 00:20    CT scan abdomen/pelvis for/20 01/2011 Impression interval improved appearance of the findings of acute appendicitis from prior exam.  No periappendiceal loculated fluid collections or free air.  No other acute intra-abdominal pelvic pathology to explain the patient's  symptoms.  DG Chest 2 View  Result Date: 09/16/2019 CLINICAL DATA:  Right upper quadrant pain  IMPRESSION: No active cardiopulmonary disease. Electronically Signed   By: Alcide Clever M.D.   On: 09/16/2019 12:27   CT Renal Stone Study  Result Date: 09/16/2019 CLINICAL DATA:  Acute right flank pain.IMPRESSION: 1. The appendix is retrocecal in position. It measures approximately 9 mm in its distal portion in maximum thickness, which is enlarged compared to prior exam, without definite surrounding inflammation, which is equivocal for appendicitis. Correlation with laboratory and clinical findings is recommended. 2. No other abnormality seen in the abdomen or pelvis. Electronically Signed   By: Lupita Raider M.D.   On: 09/16/2019 12:17   Procedures Procedures (including critical care time)  Medications Ordered in ED Medications  sodium chloride 0.9 % bolus 1,000 mL (1,000 mLs Intravenous New Bag/Given (Non-Interop) 10/06/19 0002)  fentaNYL (SUBLIMAZE) injection 50 mcg (50 mcg Intravenous Given 10/06/19 0002)  ondansetron (ZOFRAN) injection 4 mg (4 mg Intravenous Given 10/06/19 0001)  iohexol (OMNIPAQUE) 300 MG/ML solution 100 mL (100 mLs Intravenous Contrast Given 10/06/19 0040)    ED Course  I have reviewed the triage vital signs and the nursing notes.  Pertinent labs & imaging results that were available during my care of the patient were reviewed by me and considered in my medical decision making (see chart for details).    MDM Rules/Calculators/A&P                     Patient was given IV fluids and given fentanyl IV for pain and Zofran for nausea.  CT abdomen pelvis was ordered.  Also due to her complaints of cough and she was noted to have cough during my exam chest x-ray was done.  She was swabbed to check her for Covid because her symptoms were suspicious for that.  Patient CT shows her symptoms have improved.  When I look at her CT however I see she has diffuse stool throughout her  colon.  She will be treated for constipation.  Patient is questioning whether she has vitamin D deficiency, she was advised to follow-up with her primary care doctor for that.  When told her UDS shows cocaine patient did not object.  Final Clinical Impression(s) / ED Diagnoses Final diagnoses:  Generalized abdominal pain  Constipation, unspecified constipation  type  Cocaine abuse (HCC)    Rx / DC Orders ED Discharge Orders    None    OTC miralax  Plan discharge  Devoria Albe, MD, Concha Pyo, MD 10/06/19 [phone]

## 2019-10-06 LAB — COMPREHENSIVE METABOLIC PANEL
ALT: 28 U/L (ref 0–44)
AST: 22 U/L (ref 15–41)
Albumin: 4.1 g/dL (ref 3.5–5.0)
Alkaline Phosphatase: 71 U/L (ref 38–126)
Anion gap: 9 (ref 5–15)
BUN: 11 mg/dL (ref 6–20)
CO2: 26 mmol/L (ref 22–32)
Calcium: 9.3 mg/dL (ref 8.9–10.3)
Chloride: 101 mmol/L (ref 98–111)
Creatinine, Ser: 0.91 mg/dL (ref 0.44–1.00)
GFR calc Af Amer: >60 mL/min (ref >60)
GFR calc non Af Amer: >60 mL/min (ref >60)
Glucose, Bld: 95 mg/dL (ref 70–99)
Potassium: 3.5 mmol/L (ref 3.5–5.1)
Sodium: 136 mmol/L (ref 135–145)
Total Bilirubin: 0.4 mg/dL (ref 0.3–1.2)
Total Protein: 7.4 g/dL (ref 6.5–8.1)

## 2019-10-06 LAB — RESPIRATORY PANEL BY RT PCR (FLU A&B, COVID)
Influenza A by PCR: NEGATIVE
Influenza B by PCR: NEGATIVE
SARS Coronavirus 2 by RT PCR: NEGATIVE

## 2019-10-06 LAB — URINALYSIS, ROUTINE W REFLEX MICROSCOPIC
Bacteria, UA: NONE SEEN
Bilirubin Urine: NEGATIVE
Glucose, UA: NEGATIVE mg/dL
Hgb urine dipstick: NEGATIVE
Ketones, ur: NEGATIVE mg/dL
Nitrite: NEGATIVE
Protein, ur: NEGATIVE mg/dL
Specific Gravity, Urine: 1.016 (ref 1.005–1.030)
pH: 6 (ref 5.0–8.0)

## 2019-10-06 LAB — CBC WITH DIFFERENTIAL/PLATELET
Abs Immature Granulocytes: 0.05 10*3/uL (ref 0.00–0.07)
Basophils Absolute: 0.1 10*3/uL (ref 0.0–0.1)
Basophils Relative: 0 %
Eosinophils Absolute: 0.3 10*3/uL (ref 0.0–0.5)
Eosinophils Relative: 2 %
HCT: 40.6 % (ref 36.0–46.0)
Hemoglobin: 13.7 g/dL (ref 12.0–15.0)
Immature Granulocytes: 0 %
Lymphocytes Relative: 25 %
Lymphs Abs: 3.4 10*3/uL (ref 0.7–4.0)
MCH: 30 pg (ref 26.0–34.0)
MCHC: 33.7 g/dL (ref 30.0–36.0)
MCV: 88.8 fL (ref 80.0–100.0)
Monocytes Absolute: 0.8 10*3/uL (ref 0.1–1.0)
Monocytes Relative: 6 %
Neutro Abs: 9.2 10*3/uL — ABNORMAL HIGH (ref 1.7–7.7)
Neutrophils Relative %: 67 %
Platelets: 303 10*3/uL (ref 150–400)
RBC: 4.57 MIL/uL (ref 3.87–5.11)
RDW: 11.9 % (ref 11.5–15.5)
WBC: 13.8 10*3/uL — ABNORMAL HIGH (ref 4.0–10.5)
nRBC: 0 % (ref 0.0–0.2)

## 2019-10-06 LAB — RAPID URINE DRUG SCREEN, HOSP PERFORMED
Amphetamines: NOT DETECTED
Barbiturates: NOT DETECTED
Benzodiazepines: NOT DETECTED
Cocaine: POSITIVE — AB
Opiates: NOT DETECTED
Tetrahydrocannabinol: NOT DETECTED

## 2019-10-06 MED ORDER — IOHEXOL 300 MG/ML  SOLN
100.0000 mL | Freq: Once | INTRAMUSCULAR | Status: AC | PRN
  Administered 2019-10-06: 100 mL via INTRAVENOUS

## 2019-10-06 NOTE — Discharge Instructions (Addendum)
Get miralax and put one dose or 17 g in 8 ounces of water,  take 1 dose every 30 minutes for 2-3 hours or until you  get good results and then once or twice daily to prevent constipation.  ° °

## 2019-10-07 ENCOUNTER — Ambulatory Visit: Payer: Self-pay | Admitting: General Surgery

## 2019-10-14 ENCOUNTER — Other Ambulatory Visit: Payer: Self-pay

## 2019-10-14 ENCOUNTER — Ambulatory Visit (INDEPENDENT_AMBULATORY_CARE_PROVIDER_SITE_OTHER): Payer: Self-pay | Admitting: General Surgery

## 2019-10-14 ENCOUNTER — Encounter: Payer: Self-pay | Admitting: General Surgery

## 2019-10-14 VITALS — BP 152/95 | HR 80 | Temp 98.1°F | Resp 12 | Ht 62.0 in | Wt 181.0 lb

## 2019-10-14 DIAGNOSIS — K3580 Unspecified acute appendicitis: Secondary | ICD-10-CM

## 2019-10-14 MED ORDER — AMOXICILLIN-POT CLAVULANATE 875-125 MG PO TABS: 1.0000 | ORAL_TABLET | Freq: Two times a day (BID) | ORAL | 0 refills | Status: DC

## 2019-10-14 MED ORDER — OXYCODONE HCL 5 MG PO TABS: 5.0000 mg | ORAL_TABLET | ORAL | 0 refills | Status: DC | PRN

## 2019-10-14 NOTE — Patient Instructions (Signed)
Appendicitis, Adult  The appendix is a tube in the body that is shaped like a finger. It is attached to the large intestine. Appendicitis means that this tube is swollen (inflamed). If this is not treated, the tube can tear (rupture). This can lead to a life-threatening infection. This condition can also cause pus to build up in the appendix (abscess). What are the causes? This condition may be caused by something that blocks the appendix. These include:  A ball of poop (stool).  Lymph glands that are bigger than normal. Sometimes the cause is not known. What increases the risk? You are more likely to develop this condition if you are between 10 and 30 years of age. What are the signs or symptoms? Symptoms of this condition include:  Pain around the belly button (navel). ? The pain moves toward the lower right belly (abdomen). ? The pain can get worse with time. ? The pain can get worse if you cough. ? The pain can get worse if you move suddenly.  Tenderness in the lower right belly.  Feeling sick to your stomach (nauseous).  Throwing up (vomiting).  Not feeling hungry (loss of appetite).  A fever.  Having trouble pooping (constipation).  Watery poop (diarrhea).  Not feeling well. How is this treated? Most often, this condition is treated by taking out the appendix (appendectomy). There are two ways to do this:  Open surgery. For this method, the appendix is taken out through a large cut (incision). The cut is made in the lower right belly. This surgery may be used if: ? You have scars from another surgery. ? You have a bleeding condition. ? You are pregnant and will be having your baby soon. ? You have a condition that makes it hard to do the other type of surgery.  Laparoscopic surgery. For this method, the appendix is taken out through small cuts. Often, this surgery: ? Causes less pain. ? Causes fewer problems. ? Is easier to heal from. If your appendix tears and  pus forms:  A drain may be put into the sore. The drain will be used to get rid of the pus.  You may get an antibiotic medicine through an IV line.  Your appendix may or may not need to be taken out. Follow these instructions at home: If you had surgery, follow instructions from your doctor on how to care for yourself at home and how to take care of your cut from surgery. Medicines  Take over-the-counter and prescription medicines only as told by your doctor.  If you were prescribed an antibiotic medicine, take it as told by your doctor. Do not stop taking the antibiotic even if you start to feel better. Eating and drinking Follow instructions from your doctor about what you cannot eat or drink. You may go back to your diet slowly if:  You no longer feel sick to your stomach.  You have stopped throwing up. General instructions  Do not use any products that contain nicotine or tobacco, such as cigarettes, e-cigarettes, and chewing tobacco. If you need help quitting, ask your doctor.  Do not drive or use heavy machinery while taking prescription pain medicine.  Ask your doctor if the medicine you are taking can cause trouble pooping. You may need to take steps to prevent or treat trouble pooping: ? Drink enough fluid to keep your pee (urine) pale yellow. ? Take over-the-counter or prescription medicines. ? Eat foods that are high in fiber. These include beans,   whole grains, and fresh fruits and vegetables. ? Limit foods that are high in fat and sugar. These include fried or sweet foods.  Keep all follow-up visits as told by your doctor. This is important. Contact a doctor if:  There is pus, blood, or a lot of fluid coming from your cut or cuts from surgery.  You are sick to your stomach or you throw up. Get help right away if:  You have pain in your belly, and the pain is getting worse.  You have a fever.  You have chills.  You are very tired.  You have muscle  pain.  You are short of breath. Summary  Appendicitis is swelling of the appendix. The appendix is a tube that is shaped like a finger. It is joined to the large intestine.  This condition may be caused by something that blocks the appendix. This can lead to an infection.  This condition is usually treated by taking out the appendix. This information is not intended to replace advice given to you by your health care provider. Make sure you discuss any questions you have with your health care provider. Document Revised: 11/12/2017 Document Reviewed: 11/12/2017 Elsevier Patient Education  [employer].   Laparoscopic Appendectomy, Adult  A laparoscopic appendectomy is a surgery to take out the appendix. The appendix is a finger-like structure that is attached to the large intestine. In this surgery, the appendix is removed through three small incisions with the help of a thin, lighted tube that has a camera (laparoscope). This procedure may be done to prevent an inflamed appendix from bursting (rupturing). It may also be done to treat the infection from an appendix that has already ruptured. It is usually done right after inflammation of the appendix (appendicitis) is diagnosed. This is a minimally invasive surgery. It usually results in less pain, fewer problems, and a quicker recovery than surgery done through a large incision. Tell a health care provider about:  Any allergies you have.  All medicines you are taking, including vitamins, herbs, eye drops, creams, and over-the-counter medicines.  Use of steroids (by mouth or creams).  Any problems you or family members have had with anesthetic medicines.  Any blood disorders you have.  Any surgeries you have had.  Any medical conditions you have.  Whether you are pregnant or may be pregnant. What are the risks? Generally, this is a safe procedure. However, problems may occur, including:  Infection.  Bleeding.  Allergic  reactions to medicines.  Damage to other structures or organs.  The formation of pus (abscesses).  Long-lasting pain or scarring at the incision sites or inside the abdomen.  Blood clots in the legs. What happens before the procedure? Eating and drinking restrictions Follow instructions from your health care provider about eating and drinking restrictions. You may be asked not to eat or drink as soon as the diagnosis of appendicitis is made. Medicines Ask your health care provider about:  Changing or stopping your regular medicines. This is especially important if you are taking diabetes medicines or blood thinners.  Taking medicines such as aspirin and ibuprofen. These medicines can thin your blood. Do not take these medicines unless your health care provider tells you to take them.  Taking over-the-counter medicines, vitamins, herbs, and supplements. General instructions  Plan to have someone take you home from the hospital.  If you will be going home right after the procedure, plan to have someone with you for 24 hours.  You may  be given antibiotic medicine to help prevent infection or to treat existing inflammation or infection.  Ask your health care provider how your surgical site will be marked or identified.  Ask your health care provider what steps will be taken to help prevent infection. These may include: ? Removing hair at the surgery site. ? Washing skin with a germ-killing soap. ? Taking antibiotic medicine. What happens during the procedure?   An IV will be inserted into one of your veins.  You will be given one or more of the following: ? A medicine to help you relax (sedative). ? A medicine to numb the area (local anesthetic). ? A medicine to make you fall asleep (general anesthetic).  A thin, flexible tube (catheter) may be put into your bladder to drain urine.  A tube may be passed through your nose and into your stomach (NG tube, or nasogastric tube)  to drain any stomach contents.  Your surgeon will make three small incisions near your belly button (navel).  Air-like gas will be used to fill your abdomen. The gas will make your abdomen expand. This helps the surgeon see clearly and gives him or her more room to work.  A laparoscope will be passed through one of the incisions.  Other long, thin surgical instruments will be passed through the other incisions.  The appendix will be located and removed through one of the incisions.  The abdomen may be washed out to remove bacteria.  The incisions will be closed with stitches (sutures), staples, or adhesive strips.  A bandage (dressing) may be used to cover the incisions.  If a tube was inserted into your bladder or stomach, it will be removed. The procedure may vary among health care providers and hospitals. What happens after the procedure?  Your blood pressure, heart rate, breathing rate, and blood oxygen level will be monitored until you leave the hospital.  You will be given medicines as needed to control pain.  Do not drive for 24 hours if you were given a sedative during your procedure.  If your appendix did not rupture, you may be able to go home the same day after your surgery.  If your appendix ruptured: ? You will get antibiotic medicine through an IV line. ? You may be sent home with a temporary drain. Summary  A laparoscopic appendectomy is a surgery to take out the appendix. The appendix is removed through three small incisions with the help of a thin, lighted tube that has a camera.  This is a safe procedure, but there are some risks, including bleeding, infection, allergic reaction to medicines, or damage to other organs.  You may be asked not to eat or drink as soon as a diagnosis of appendicitis is made.  After the procedure, your blood pressure, heart rate, breathing rate, and blood oxygen level will be monitored until you leave the hospital. This  information is not intended to replace advice given to you by your health care provider. Make sure you discuss any questions you have with your health care provider. Document Revised: 11/27/2017 Document Reviewed: 11/27/2017 Elsevier Patient Education  2020 [employer].

## 2019-10-14 NOTE — Progress Notes (Signed)
[facility] History and Physical  Reason for Referral: Acute appendicitis   Chief Complaint    Follow-up      Andrea Burns is a 34 y.o. female.  HPI: Andrea Burns is a 34 yo who presented last month with early acute appendicitis and was on phentermine at that time so we could not proceed with surgery. She has been holding this and has completed a round of antibiotics. She denies any fevers or chills but continues to have lower abdominal pain and pain going into her right side. She went to the ED and had a repeat image that demonstrated resolution of her appendicitis. She says she is eating but is constipated at times. She says that when she has a BM she has lower abdominal pain. She also is complaining of leg pain and shaking.   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 She contracts as a Lawyer.  She is worried her pain is getting worse again and another infection is starting.   Past Medical History:  Diagnosis Date  . Anxiety   . Depression   . GERD (gastroesophageal reflux disease)   . HA (headache)   . 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 Medical history non-contributory   . Pneumonia    2013    Past Surgical History:  Procedure Laterality Date  . COLPOSCOPY W/ BIOPSY / CURETTAGE    . IUD REMOVAL    . LAPAROSCOPY ABDOMEN DIAGNOSTIC     Removal of migrated IUD   . NO PAST SURGERIES      Family History  Problem Relation Age of Onset  . Diabetes Mother   . Hypertension Mother   . Cancer Mother   . Diabetes Father   . Cancer Paternal Grandmother        liver & lung    Social History   Tobacco Use  . Smoking status: Former Smoker    Packs/day: 0.25    Years: 1.00    Pack years: 0.25    Quit date: 05/03/2005    Years since quitting: 14.4  . Smokeless tobacco: Never Used  Substance Use Topics  . Alcohol use: No    Alcohol/week: 0.0 standard drinks  . Drug use: Not Currently    Types: Cocaine     Comment: last used 3 to 4 months ago    Medications: I have reviewed the patient's current medications. Allergies as of 10/14/2019   No Known Allergies     Medication List       Accurate as of Oct 14, 2019  1:32 PM. If you have any questions, ask your nurse or doctor.        STOP taking these medications   oxyCODONE 5 MG immediate release tablet Commonly known as: Roxicodone Stopped by: [name], MD     TAKE these medications   docusate sodium 100 MG capsule Commonly known as: Colace Take 1 capsule (100 mg total) by mouth 2 (two) times daily.   Drospirenone 4 MG Tabs Commonly known as: Slynd Take 1 tablet by mouth daily. What changed:   how much to take  when to take this   lisinopril-hydrochlorothiazide 10-12.5 MG tablet Commonly known as: ZESTORETIC Take 1 tablet by mouth daily.   ondansetron 4 MG tablet Commonly known as: Zofran Take 1 tablet (4 mg total) by mouth daily as needed for nausea or vomiting.        ROS:  A  comprehensive review of systems was negative except for: Gastrointestinal: positive for abdominal pain and constipation  Blood pressure (!) 152/95, pulse 80, temperature 98.1 F (36.7 C), temperature source Oral, resp. rate 12, height 5\' 2"  (1.575 m), weight 181 lb (82.1 kg), SpO2 94 %. Physical Exam Vitals reviewed.  Constitutional:      Appearance: She is normal weight.  HENT:     Head: Normocephalic.     Nose: Nose normal.     Mouth/Throat:     Mouth: Mucous membranes are moist.  Eyes:     Extraocular Movements: Extraocular movements intact.     Pupils: Pupils are equal, round, and reactive to light.  Cardiovascular:     Rate and Rhythm: Normal rate and regular rhythm.  Pulmonary:     Effort: Pulmonary effort is normal.     Breath sounds: Normal breath sounds.  Abdominal:     General: There is no distension.     Palpations: Abdomen is soft.     Tenderness: There is abdominal tenderness.  Musculoskeletal:         General: No swelling. Normal range of motion.     Cervical back: Normal range of motion. No rigidity.  Skin:    General: Skin is warm and dry.  Neurological:     General: No focal deficit present.     Mental Status: She is alert and oriented to person, place, and time.  Psychiatric:        Mood and Affect: Mood normal.        Behavior: Behavior normal.        Thought Content: Thought content normal.        Judgment: Judgment normal.     Results: Personally reviewed CT- appendix now like 6 mm in size and no stranding, improved from prior, remains retrocecal  CLINICAL DATA:  Recent diagnosis of appendicitis on 09/16/2019, however has not been able to have surgery  EXAM: CT ABDOMEN AND PELVIS WITH CONTRAST  TECHNIQUE: Multidetector CT imaging of the abdomen and pelvis was performed using the standard protocol following bolus administration of intravenous contrast.  CONTRAST:  11/16/2019 OMNIPAQUE IOHEXOL 300 MG/ML  SOLN  COMPARISON:  September 16, 2019  FINDINGS: Lower chest: The visualized heart size within normal limits. No pericardial fluid/thickening.  No hiatal hernia.  The visualized portions of the lungs are clear.  Hepatobiliary: The liver is normal in density without focal abnormality.The main portal vein is patent. No evidence of calcified gallstones, gallbladder wall thickening or biliary dilatation.  Pancreas: Unremarkable. No pancreatic ductal dilatation or surrounding inflammatory changes.  Spleen: Normal in size without focal abnormality.  Adrenals/Urinary Tract: Both adrenal glands appear normal. The kidneys and collecting system appear normal without evidence of urinary tract calculus or hydronephrosis. Bladder is unremarkable.  Stomach/Bowel: The stomach, small bowel, and colon are normal in appearance. No inflammatory changes, wall thickening, or obstructive findings.There is improved inflammatory changes seen around the appendix which is  mildly prominent measuring up to 7 mm. Scattered small lymph nodes are present in the right lower quadrant. No periappendiceal loculated fluid collections or free air.  Vascular/Lymphatic: There are no enlarged mesenteric, retroperitoneal, or pelvic lymph nodes. No significant vascular findings are present.  Reproductive: The uterus and adnexa are unremarkable. Small amount of physiologic free fluid in the deep pelvis.  Other: No evidence of abdominal wall mass or hernia.  Musculoskeletal: No acute or significant osseous findings.  IMPRESSION: Interval improved appearance of the findings of acute appendicitis from the  prior exam. No periappendiceal loculated fluid collections or free air.  No other acute intra-abdominopelvic pathology to explain the patient's symptoms.   Electronically Signed   By: Prudencio Pair M.D.   On: 10/06/2019 01:17  Assessment & Plan:  Andrea Burns is a 35 y.o. female with continued pain and a recent history of acute appendicitis. She had no appendicolith but is still with pain and potentially a smoldering case of appendicitis. Given this we discussed management with antibiotics alone and failure rates of 40%+ and we discussed laparoscopic appendectomy. She has a history of a migrated IUD but no other surgery.   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     Virl Cagey 10/14/2019, 1:32 PM

## 2019-10-15 ENCOUNTER — Encounter (HOSPITAL_COMMUNITY)
Admission: RE | Admit: 2019-10-15 | Discharge: 2019-10-15 | Disposition: A | Discharge status: Home / Self Care | Payer: Medicaid Other | Source: Ambulatory Visit | Attending: General Surgery | Admitting: General Surgery

## 2019-10-15 ENCOUNTER — Other Ambulatory Visit (HOSPITAL_COMMUNITY)
Admission: RE | Admit: 2019-10-15 | Discharge: 2019-10-15 | Disposition: A | Discharge status: Home / Self Care | Payer: Medicaid Other | Source: Ambulatory Visit | Attending: General Surgery | Admitting: General Surgery

## 2019-10-15 DIAGNOSIS — Z20822 Contact with and (suspected) exposure to covid-19: Secondary | ICD-10-CM | POA: Insufficient documentation

## 2019-10-15 DIAGNOSIS — Z01812 Encounter for preprocedural laboratory examination: Secondary | ICD-10-CM | POA: Insufficient documentation

## 2019-10-15 NOTE — H&P (Signed)
[facility] History and Physical  Reason for Referral: Acute appendicitis   Chief Complaint    Follow-up      Andrea Burns is a 34 y.o. female.  HPI: Andrea Burns is a 34 yo who presented last month with early acute appendicitis and was on phentermine at that time so we could not proceed with surgery. She has been holding this and has completed a round of antibiotics. She denies any fevers or chills but continues to have lower abdominal pain and pain going into her right side. She went to the ED and had a repeat image that demonstrated resolution of her appendicitis. She says she is eating but is constipated at times. She says that when she has a BM she has lower abdominal pain. She also is complaining of leg pain and shaking.   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 She contracts as a CNA.  She is worried her pain is getting worse again and another infection is starting.   Past Medical History:  Diagnosis Date  . Anxiety   . Depression   . GERD (gastroesophageal reflux disease)   . HA (headache)   . 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 Medical history non-contributory   . Pneumonia    2013    Past Surgical History:  Procedure Laterality Date  . COLPOSCOPY W/ BIOPSY / CURETTAGE    . IUD REMOVAL    . LAPAROSCOPY ABDOMEN DIAGNOSTIC     Removal of migrated IUD   . NO PAST SURGERIES      Family History  Problem Relation Age of Onset  . Diabetes Mother   . Hypertension Mother   . Cancer Mother   . Diabetes Father   . Cancer Paternal Grandmother        liver & lung    Social History   Tobacco Use  . Smoking status: Former Smoker    Packs/day: 0.25    Years: 1.00    Pack years: 0.25    Quit date: 05/03/2005    Years since quitting: 14.4  . Smokeless tobacco: Never Used  Substance Use Topics  . Alcohol use: No    Alcohol/week: 0.0 standard drinks  . Drug use: Not Currently    Types: Cocaine     Comment: last used 3 to 4 months ago    Medications: I have reviewed the patient's current medications. Allergies as of 10/14/2019   No Known Allergies     Medication List       Accurate as of Oct 14, 2019  1:32 PM. If you have any questions, ask your nurse or doctor.        STOP taking these medications   oxyCODONE 5 MG immediate release tablet Commonly known as: Roxicodone Stopped by: Spring [place] C Brayon Bielefeld, MD     TAKE these medications   docusate sodium 100 MG capsule Commonly known as: Colace Take 1 capsule (100 mg total) by mouth 2 (two) times daily.   Drospirenone 4 MG Tabs Commonly known as: Slynd Take 1 tablet by mouth daily. What changed:   how much to take  when to take this   lisinopril-hydrochlorothiazide 10-12.5 MG tablet Commonly known as: ZESTORETIC Take 1 tablet by mouth daily.   ondansetron 4 MG tablet Commonly known as: Zofran Take 1 tablet (4 mg total) by mouth daily as needed for nausea or vomiting.        ROS:  A   comprehensive review of systems was negative except for: Gastrointestinal: positive for abdominal pain and constipation  Blood pressure (!) 152/95, pulse 80, temperature 98.1 F (36.7 C), temperature source Oral, resp. rate 12, height 5' 2" (1.575 m), weight 181 lb (82.1 kg), SpO2 94 %. Physical Exam Vitals reviewed.  Constitutional:      Appearance: She is normal weight.  HENT:     Head: Normocephalic.     Nose: Nose normal.     Mouth/Throat:     Mouth: Mucous membranes are moist.  Eyes:     Extraocular Movements: Extraocular movements intact.     Pupils: Pupils are equal, round, and reactive to light.  Cardiovascular:     Rate and Rhythm: Normal rate and regular rhythm.  Pulmonary:     Effort: Pulmonary effort is normal.     Breath sounds: Normal breath sounds.  Abdominal:     General: There is no distension.     Palpations: Abdomen is soft.     Tenderness: There is abdominal tenderness.  Musculoskeletal:         General: No swelling. Normal range of motion.     Cervical back: Normal range of motion. No rigidity.  Skin:    General: Skin is warm and dry.  Neurological:     General: No focal deficit present.     Mental Status: She is alert and oriented to person, place, and time.  Psychiatric:        Mood and Affect: Mood normal.        Behavior: Behavior normal.        Thought Content: Thought content normal.        Judgment: Judgment normal.     Results: Personally reviewed CT- appendix now like 6 mm in size and no stranding, improved from prior, remains retrocecal  CLINICAL DATA:  Recent diagnosis of appendicitis on 09/16/2019, however has not been able to have surgery  EXAM: CT ABDOMEN AND PELVIS WITH CONTRAST  TECHNIQUE: Multidetector CT imaging of the abdomen and pelvis was performed using the standard protocol following bolus administration of intravenous contrast.  CONTRAST:  100mL OMNIPAQUE IOHEXOL 300 MG/ML  SOLN  COMPARISON:  September 16, 2019  FINDINGS: Lower chest: The visualized heart size within normal limits. No pericardial fluid/thickening.  No hiatal hernia.  The visualized portions of the lungs are clear.  Hepatobiliary: The liver is normal in density without focal abnormality.The main portal vein is patent. No evidence of calcified gallstones, gallbladder wall thickening or biliary dilatation.  Pancreas: Unremarkable. No pancreatic ductal dilatation or surrounding inflammatory changes.  Spleen: Normal in size without focal abnormality.  Adrenals/Urinary Tract: Both adrenal glands appear normal. The kidneys and collecting system appear normal without evidence of urinary tract calculus or hydronephrosis. Bladder is unremarkable.  Stomach/Bowel: The stomach, small bowel, and colon are normal in appearance. No inflammatory changes, wall thickening, or obstructive findings.There is improved inflammatory changes seen around the appendix which is  mildly prominent measuring up to 7 mm. Scattered small lymph nodes are present in the right lower quadrant. No periappendiceal loculated fluid collections or free air.  Vascular/Lymphatic: There are no enlarged mesenteric, retroperitoneal, or pelvic lymph nodes. No significant vascular findings are present.  Reproductive: The uterus and adnexa are unremarkable. Small amount of physiologic free fluid in the deep pelvis.  Other: No evidence of abdominal wall mass or hernia.  Musculoskeletal: No acute or significant osseous findings.  IMPRESSION: Interval improved appearance of the findings of acute appendicitis from the   prior exam. No periappendiceal loculated fluid collections or free air.  No other acute intra-abdominopelvic pathology to explain the patient's symptoms.   Electronically Signed   By: Bindu  Avutu M.D.   On: 10/06/2019 01:17  Assessment & Plan:  Tamia A [place]a is a 34 y.o. female with continued pain and a recent history of acute appendicitis. She had no appendicolith but is still with pain and potentially a smoldering case of appendicitis. Given this we discussed management with antibiotics alone and failure rates of 40%+ and we discussed laparoscopic appendectomy. She has a history of a migrated IUD but no other surgery.   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     Rachyl Wuebker C Kieren Ricci 10/14/2019, 1:32 PM       

## 2019-10-16 LAB — SARS CORONAVIRUS 2 (TAT 6-24 HRS): SARS Coronavirus 2: NEGATIVE

## 2019-10-18 ENCOUNTER — Ambulatory Visit (HOSPITAL_COMMUNITY): Payer: Self-pay | Admitting: Anesthesiology

## 2019-10-18 ENCOUNTER — Encounter (HOSPITAL_COMMUNITY)
Admission: RE | Disposition: A | Discharge status: Home / Self Care | Payer: Self-pay | Source: Home / Self Care | Attending: General Surgery

## 2019-10-18 ENCOUNTER — Ambulatory Visit (HOSPITAL_COMMUNITY)
Admission: RE | Admit: 2019-10-18 | Discharge: 2019-10-18 | Disposition: A | Discharge status: Home / Self Care | Payer: Self-pay | Attending: General Surgery | Admitting: General Surgery

## 2019-10-18 DIAGNOSIS — Z87891 Personal history of nicotine dependence: Secondary | ICD-10-CM | POA: Insufficient documentation

## 2019-10-18 DIAGNOSIS — Z79899 Other long term (current) drug therapy: Secondary | ICD-10-CM | POA: Insufficient documentation

## 2019-10-18 DIAGNOSIS — K3580 Unspecified acute appendicitis: Secondary | ICD-10-CM | POA: Insufficient documentation

## 2019-10-18 DIAGNOSIS — I10 Essential (primary) hypertension: Secondary | ICD-10-CM | POA: Insufficient documentation

## 2019-10-18 DIAGNOSIS — K5900 Constipation, unspecified: Secondary | ICD-10-CM | POA: Insufficient documentation

## 2019-10-18 HISTORY — PX: LAPAROSCOPIC APPENDECTOMY: SHX408

## 2019-10-18 LAB — PREGNANCY, URINE: Preg Test, Ur: NEGATIVE

## 2019-10-18 LAB — RAPID URINE DRUG SCREEN, HOSP PERFORMED
Amphetamines: NOT DETECTED
Barbiturates: NOT DETECTED
Benzodiazepines: NOT DETECTED
Cocaine: NOT DETECTED
Opiates: NOT DETECTED
Tetrahydrocannabinol: NOT DETECTED

## 2019-10-18 SURGERY — APPENDECTOMY, LAPAROSCOPIC
Anesthesia: General | Site: Abdomen

## 2019-10-18 MED ORDER — LABETALOL HCL 5 MG/ML IV SOLN
INTRAVENOUS | Status: AC
  Filled 2019-10-18: qty 4

## 2019-10-18 MED ORDER — PROPOFOL 10 MG/ML IV BOLUS
INTRAVENOUS | Status: AC
  Filled 2019-10-18: qty 60

## 2019-10-18 MED ORDER — SUGAMMADEX SODIUM 200 MG/2ML IV SOLN
INTRAVENOUS | Status: DC | PRN
  Administered 2019-10-18: 200 mg via INTRAVENOUS

## 2019-10-18 MED ORDER — ONDANSETRON HCL 4 MG/2ML IJ SOLN
INTRAMUSCULAR | Status: AC
  Filled 2019-10-18: qty 2

## 2019-10-18 MED ORDER — BUPIVACAINE LIPOSOME 1.3 % IJ SUSP
INTRAMUSCULAR | Status: DC | PRN
  Administered 2019-10-18: 20 mL

## 2019-10-18 MED ORDER — SODIUM CHLORIDE 0.9 % IV SOLN
2.0000 g | INTRAVENOUS | Status: AC
  Administered 2019-10-18: 2 g via INTRAVENOUS
  Filled 2019-10-18: qty 2

## 2019-10-18 MED ORDER — MEPERIDINE HCL 50 MG/ML IJ SOLN: 6.2500 mg | INTRAMUSCULAR | Status: DC | PRN

## 2019-10-18 MED ORDER — HYDROMORPHONE HCL 1 MG/ML IJ SOLN
0.2500 mg | INTRAMUSCULAR | Status: DC | PRN
  Administered 2019-10-18 (×3): 0.5 mg via INTRAVENOUS
  Filled 2019-10-18 (×3): qty 0.5

## 2019-10-18 MED ORDER — BUPIVACAINE LIPOSOME 1.3 % IJ SUSP
INTRAMUSCULAR | Status: AC
  Filled 2019-10-18: qty 10

## 2019-10-18 MED ORDER — CHLORHEXIDINE GLUCONATE CLOTH 2 % EX PADS: 6.0000 | MEDICATED_PAD | Freq: Once | CUTANEOUS | Status: DC

## 2019-10-18 MED ORDER — DEXAMETHASONE SODIUM PHOSPHATE 4 MG/ML IJ SOLN
INTRAMUSCULAR | Status: DC | PRN
  Administered 2019-10-18: 4 mg via INTRAVENOUS

## 2019-10-18 MED ORDER — ONDANSETRON HCL 4 MG/2ML IJ SOLN
INTRAMUSCULAR | Status: DC | PRN
  Administered 2019-10-18: 4 mg via INTRAVENOUS

## 2019-10-18 MED ORDER — MIDAZOLAM HCL 2 MG/2ML IJ SOLN
INTRAMUSCULAR | Status: AC
  Filled 2019-10-18: qty 2

## 2019-10-18 MED ORDER — SODIUM CHLORIDE 0.9 % IR SOLN
Status: DC | PRN
  Administered 2019-10-18: 1

## 2019-10-18 MED ORDER — PROMETHAZINE HCL 25 MG/ML IJ SOLN: 6.2500 mg | INTRAMUSCULAR | Status: DC | PRN

## 2019-10-18 MED ORDER — FENTANYL CITRATE (PF) 100 MCG/2ML IJ SOLN
INTRAMUSCULAR | Status: DC | PRN
  Administered 2019-10-18: 100 ug via INTRAVENOUS
  Administered 2019-10-18: 150 ug via INTRAVENOUS

## 2019-10-18 MED ORDER — DEXAMETHASONE SODIUM PHOSPHATE 10 MG/ML IJ SOLN
INTRAMUSCULAR | Status: AC
  Filled 2019-10-18: qty 1

## 2019-10-18 MED ORDER — OXYCODONE HCL 5 MG PO TABS: 5.0000 mg | ORAL_TABLET | ORAL | 0 refills | Status: DC | PRN

## 2019-10-18 MED ORDER — LIDOCAINE 2% (20 MG/ML) 5 ML SYRINGE
INTRAMUSCULAR | Status: AC
  Filled 2019-10-18: qty 5

## 2019-10-18 MED ORDER — PROPOFOL 10 MG/ML IV BOLUS
INTRAVENOUS | Status: DC | PRN
  Administered 2019-10-18: 200 mg via INTRAVENOUS

## 2019-10-18 MED ORDER — MIDAZOLAM HCL 2 MG/2ML IJ SOLN
2.0000 mg | Freq: Once | INTRAMUSCULAR | Status: AC
  Administered 2019-10-18: 2 mg via INTRAVENOUS

## 2019-10-18 MED ORDER — FENTANYL CITRATE (PF) 250 MCG/5ML IJ SOLN
INTRAMUSCULAR | Status: AC
  Filled 2019-10-18: qty 5

## 2019-10-18 MED ORDER — LIDOCAINE HCL (CARDIAC) PF 100 MG/5ML IV SOSY
PREFILLED_SYRINGE | INTRAVENOUS | Status: DC | PRN
  Administered 2019-10-18: 80 mg via INTRAVENOUS

## 2019-10-18 MED ORDER — ROCURONIUM BROMIDE 100 MG/10ML IV SOLN
INTRAVENOUS | Status: DC | PRN
  Administered 2019-10-18: 40 mg via INTRAVENOUS

## 2019-10-18 MED ORDER — LACTATED RINGERS IV SOLN: Freq: Once | INTRAVENOUS | Status: AC

## 2019-10-18 MED ORDER — LABETALOL HCL 5 MG/ML IV SOLN
INTRAVENOUS | Status: DC | PRN
  Administered 2019-10-18: 5 mg via INTRAVENOUS

## 2019-10-18 MED ORDER — FENTANYL CITRATE (PF) 100 MCG/2ML IJ SOLN
INTRAMUSCULAR | Status: AC
  Filled 2019-10-18: qty 2

## 2019-10-18 MED ORDER — LABETALOL HCL 5 MG/ML IV SOLN
10.0000 mg | INTRAVENOUS | Status: AC | PRN
  Administered 2019-10-18 (×2): 5 mg via INTRAVENOUS
  Filled 2019-10-18: qty 4

## 2019-10-18 SURGICAL SUPPLY — 56 items
ADH SKN CLS APL DERMABOND .7 (GAUZE/BANDAGES/DRESSINGS) ×1
APL PRP STRL LF DISP 70% ISPRP (MISCELLANEOUS) ×1
APL PRP STRL LF ISPRP CHG 10.5 (MISCELLANEOUS) ×1
APPLICATOR CHLORAPREP 10.5 ORG (MISCELLANEOUS) ×1 IMPLANT
BAG RETRIEVAL 10 (BASKET) ×1
BLADE SURG 15 STRL LF DISP TIS (BLADE) ×1 IMPLANT
BLADE SURG 15 STRL SS (BLADE) ×2
CHLORAPREP W/TINT 26 (MISCELLANEOUS) ×2 IMPLANT
CLOTH BEACON ORANGE TIMEOUT ST (SAFETY) ×2 IMPLANT
COVER LIGHT HANDLE STERIS (MISCELLANEOUS) ×4 IMPLANT
COVER WAND RF STERILE (DRAPES) ×2 IMPLANT
CUTTER FLEX LINEAR 45M (STAPLE) ×2 IMPLANT
DERMABOND ADVANCED (GAUZE/BANDAGES/DRESSINGS) ×1
DERMABOND ADVANCED .7 DNX12 (GAUZE/BANDAGES/DRESSINGS) ×1 IMPLANT
ELECT REM PT RETURN 9FT ADLT (ELECTROSURGICAL) ×2
ELECTRODE REM PT RTRN 9FT ADLT (ELECTROSURGICAL) ×1 IMPLANT
GLOVE BIO SURGEON STRL SZ 6.5 (GLOVE) ×2 IMPLANT
GLOVE BIOGEL M 6.5 STRL (GLOVE) ×1 IMPLANT
GLOVE BIOGEL PI IND STRL 6.5 (GLOVE) ×1 IMPLANT
GLOVE BIOGEL PI IND STRL 7.0 (GLOVE) ×3 IMPLANT
GLOVE BIOGEL PI INDICATOR 6.5 (GLOVE) ×2
GLOVE BIOGEL PI INDICATOR 7.0 (GLOVE) ×2
GLOVE ECLIPSE 6.5 STRL STRAW (GLOVE) ×1 IMPLANT
GOWN STRL REUS W/TWL LRG LVL3 (GOWN DISPOSABLE) ×5 IMPLANT
INST SET LAPROSCOPIC AP (KITS) ×2 IMPLANT
KIT TURNOVER KIT A (KITS) ×2 IMPLANT
MANIFOLD NEPTUNE II (INSTRUMENTS) ×2 IMPLANT
NDL HYPO 18GX1.5 BLUNT FILL (NEEDLE) ×1 IMPLANT
NDL INSUFFLATION 14GA 120MM (NEEDLE) ×1 IMPLANT
NEEDLE HYPO 18GX1.5 BLUNT FILL (NEEDLE) ×2 IMPLANT
NEEDLE HYPO 22GX1.5 SAFETY (NEEDLE) ×2 IMPLANT
NEEDLE INSUFFLATION 14GA 120MM (NEEDLE) ×2 IMPLANT
NS IRRIG 1000ML POUR BTL (IV SOLUTION) ×2 IMPLANT
PACK LAP CHOLE LZT030E (CUSTOM PROCEDURE TRAY) ×2 IMPLANT
PAD ARMBOARD 7.5X6 YLW CONV (MISCELLANEOUS) ×2 IMPLANT
PENCIL HANDSWITCHING (ELECTRODE) ×1 IMPLANT
RELOAD 45 VASCULAR/THIN (ENDOMECHANICALS) IMPLANT
RELOAD STAPLE 45 2.5 WHT GRN (ENDOMECHANICALS) IMPLANT
RELOAD STAPLE 45 3.5 BLU ETS (ENDOMECHANICALS) IMPLANT
RELOAD STAPLE TA45 3.5 REG BLU (ENDOMECHANICALS) ×2 IMPLANT
SET BASIN LINEN APH (SET/KITS/TRAYS/PACK) ×2 IMPLANT
SET TUBE IRRIG SUCTION NO TIP (IRRIGATION / IRRIGATOR) IMPLANT
SET TUBE SMOKE EVAC HIGH FLOW (TUBING) ×2 IMPLANT
SHEARS HARMONIC ACE PLUS 36CM (ENDOMECHANICALS) ×2 IMPLANT
SUT MNCRL AB 4-0 PS2 18 (SUTURE) ×3 IMPLANT
SUT VICRYL 0 UR6 27IN ABS (SUTURE) ×2 IMPLANT
SYR 20ML LL LF (SYRINGE) ×4 IMPLANT
SYS BAG RETRIEVAL 10MM (BASKET) ×1
SYSTEM BAG RETRIEVAL 10MM (BASKET) ×1 IMPLANT
TRAY FOLEY W/BAG SLVR 16FR (SET/KITS/TRAYS/PACK) ×2
TRAY FOLEY W/BAG SLVR 16FR ST (SET/KITS/TRAYS/PACK) ×1 IMPLANT
TROCAR ENDO BLADELESS 11MM (ENDOMECHANICALS) ×2 IMPLANT
TROCAR ENDO BLADELESS 12MM (ENDOMECHANICALS) ×2 IMPLANT
TROCAR XCEL NON-BLD 5MMX100MML (ENDOMECHANICALS) ×2 IMPLANT
WARMER LAPAROSCOPE (MISCELLANEOUS) ×2 IMPLANT
YANKAUER SUCT 12FT TUBE ARGYLE (SUCTIONS) ×2 IMPLANT

## 2019-10-18 NOTE — Discharge Instructions (Signed)
PLEASE WEAR TEAL EXPAREL BRACELET UNTIL Friday MAY 14TH. 2021. DO NOT RECEIVE ANY FURTHER NUMBING MEDICATIONS WITHOUT CONSULTING A PHYSICIAN UNTIL Friday MAY 14TH.      Discharge Laparoscopic Surgery Instructions:  Common Complaints: Right shoulder pain is common after laparoscopic surgery. This is secondary to the gas used in the surgery being trapped under the diaphragm.  Walk to help your body absorb the gas. This will improve in a few days. Pain at the port sites are common, especially the larger port sites. This will improve with time.  Some nausea is common and poor appetite. The main goal is to stay hydrated the first few days after surgery.   Diet/ Activity: Diet as tolerated. You may not have an appetite, but it is important to stay hydrated. Drink 64 ounces of water a day. Your appetite will return with time.  Shower per your regular routine daily.  Do not take hot showers. Take warm showers that are less than 10 minutes. Rest and listen to your body, but do not remain in bed all day.  Walk everyday for at least 15-20 minutes. Deep cough and move around every 1-2 hours in the first few days after surgery.  Do not lift > 10 lbs, perform excessive bending, pushing, pulling, squatting for 1 week after surgery.  Do not pick at the dermabond glue on your incision sites.  This glue film will remain in place for 1-2 weeks and will start to peel off.  Do not place lotions or balms on your incision unless instructed to specifically by Dr. Henreitta Leber.  You can return to work in the next week and slowly increase your time and scheduling.   Pain Expectations and Narcotics: -After surgery you will have pain associated with your incisions and this is normal. The pain is muscular and nerve pain, and will get better with time. -You are encouraged and expected to take non narcotic medications like tylenol and ibuprofen (when able) to treat pain as multiple modalities can aid with pain  treatment. -Narcotics are only used when pain is severe or there is breakthrough pain. -You are not expected to have a pain score of 0 after surgery, as we cannot prevent pain. A pain score of 3-4 that allows you to be functional, move, walk, and tolerate some activity is the goal. The pain will continue to improve over the days after surgery and is dependent on your surgery. -Due to [place] law, we are only able to give a certain amount of pain medication to treat post operative pain, and we only give additional narcotics on a patient by patient basis.  -For most laparoscopic surgery, studies have shown that the majority of patients only need 10-15 narcotic pills, and for open surgeries most patients only need 15-20.   -Having appropriate expectations of pain and knowledge of pain management with non narcotics is important as we do not want anyone to become addicted to narcotic pain medication.  -Using ice packs in the first 48 hours and heating pads after 48 hours, wearing an abdominal binder (when recommended), and using over the counter medications are all ways to help with pain management.   -Simple acts like meditation and mindfulness practices after surgery can also help with pain control and research has proven the benefit of these practices.  Medication: Take tylenol and ibuprofen as needed for pain control, alternating every 4-6 hours.  Example:  Tylenol 1000mg  @ 6am, 12noon, 6pm, (Do not exceed 4000mg  of tylenol a day).  Ibuprofen 800mg  @ 9am, 3pm, 9pm, 3am (Do not exceed 3600mg  of ibuprofen a day).  Take Roxicodone for breakthrough pain every 4 hours.  Take Colace for constipation related to narcotic pain medication. If you do not have a bowel movement in 2 days, take Miralax over the counter.  Drink plenty of water to also prevent constipation.   Contact Information: If you have questions or concerns, please call our office, [phone], Monday- Thursday 8AM-5PM and Friday  8AM-12Noon.  If it is after hours or on the weekend, please call [facility], [phone], and ask to speak to the surgeon on call for Dr. Sunday at [facility].    Laparoscopic Appendectomy, Adult, Care After This sheet gives you information about how to care for yourself after your procedure. Your doctor may also give you more specific instructions. If you have problems or questions, contact your doctor. What can I expect after the procedure? After the procedure, it is common to have:  Little energy for normal activities.  Mild pain in the area where the cuts from surgery (incisions) were made.  Trouble pooping (constipation). This can be caused by: ? Pain medicine. ? A lack of activity. Follow these instructions at home: Medicines  Take over-the-counter and prescription medicines only as told by your doctor.  If you were prescribed an antibiotic medicine, take it as told by your doctor. Do not stop taking it even if you start to feel better.  Do not drive or use heavy machinery while taking prescription pain medicine.  Ask your doctor if the medicine you are taking can cause trouble pooping. You may need to take steps to prevent or treat trouble pooping: ? Drink enough fluid to keep your pee (urine) pale yellow. ? Take over-the-counter or prescription medicines. ? Eat foods that are high in fiber. These include beans, whole grains, and fresh fruits and vegetables. ? Limit foods that are high in fat and sugar. These include fried or sweet foods. Incision care   Follow instructions from your doctor about how to take care of your cuts from surgery. Make sure you: ? Wash your hands with soap and water before and after you change your bandage (dressing). If you cannot use soap and water, use hand sanitizer. ? Change your bandage as told by your doctor. ? Leave stitches (sutures), skin glue, or skin tape (adhesive) strips in place. They may need to stay in place for 2 weeks  or longer. If tape strips get loose and curl up, you may trim the loose edges. Do not remove tape strips completely unless your doctor says it is okay.  Check your cuts from surgery every day for signs of infection. Check for: ? Redness, swelling, or pain. ? Fluid or blood. ? Warmth. ? Pus or a bad smell. Bathing  Keep your cuts from surgery clean and dry. Clean them as told by your doctor. To do this: 1. Gently wash the cuts with soap and water. 2. Rinse the cuts with water to remove all soap. 3. Pat the cuts dry with a clean towel. Do not rub the cuts.  Do not take baths, swim, or use a hot tub for 2 weeks, or until your doctor says it is okay. You may take showers after 48 hours. Activity   Do not drive for 24 hours if you were given a medicine to help you relax (sedative) during your procedure.  Rest after the procedure. Return to your normal activities as told by your doctor.  Ask your doctor what activities are safe for you.  For 1-2 weeks, or for as long as told by your doctor: ? Do not lift anything that is heavier than 10 lb (4.5 kg), or the limit that you are told. ? Do not play contact sports for 2-4 weeks. General instructions  If you were sent home with a drain, follow instructions from your doctor on how to care for it.  Take deep breaths. This helps to keep your lungs from getting an infection (pneumonia).  Keep all follow-up visits as told by your doctor. This is important. Contact a doctor if:  You have redness, swelling, or pain around a cut from surgery.  You have fluid or blood coming from a cut.  Your cut feels warm to the touch.  You have pus or a bad smell coming from a cut or a bandage.  The edges of a cut break open after the stitches have been taken out.  You have pain in your shoulders that gets worse.  You feel dizzy or you pass out (faint).  You have shortness of breath.  You keep feeling sick to your stomach (nauseous).  You keep  throwing up (vomiting).  You get watery poop (diarrhea) or you cannot control your poop.  You lose your appetite.  You have swelling or pain in your legs.  You get a rash. Get help right away if:  You have a fever.  You have trouble breathing.  You have sharp pains in your chest. Summary  After the procedure, it is common to have low energy, mild pain, and trouble pooping.  Infection is a common problem after this procedure. Follow your doctor's instructions about caring for yourself after the procedure.  Rest after the procedure. Return to your normal activities as told by your doctor.  Contact your doctor if you see signs of infection around your cuts from surgery, or you get short of breath. Get help right away if you have a fever, chest pain, or trouble breathing. This information is not intended to replace advice given to you by your health care provider. Make sure you discuss any questions you have with your health care provider. Document Revised: 11/27/2017 Document Reviewed: 11/27/2017 Elsevier Patient Education  2020 [place] Anesthesia, Adult, Care After This sheet gives you information about how to care for yourself after your procedure. Your health care provider may also give you more specific instructions. If you have problems or questions, contact your health care provider. What can I expect after the procedure? After the procedure, the following side effects are common:  Pain or discomfort at the IV site.  Nausea.  Vomiting.  Sore throat.  Trouble concentrating.  Feeling cold or chills.  Weak or tired.  Sleepiness and fatigue.  Soreness and body aches. These side effects can affect parts of the body that were not involved in surgery. Follow these instructions at home:  For at least 24 hours after the procedure:  Have a responsible adult stay with you. It is important to have someone help care for you until you are awake and  alert.  Rest as needed.  Do not: ? Participate in activities in which you could fall or become injured. ? Drive. ? Use heavy machinery. ? Drink alcohol. ? Take sleeping pills or medicines that cause drowsiness. ? Make important decisions or sign legal documents. ? Take care of children on your own. Eating and drinking  Follow any instructions  from your health care provider about eating or drinking restrictions.  When you feel hungry, start by eating small amounts of foods that are soft and easy to digest (bland), such as toast. Gradually return to your regular diet.  Drink enough fluid to keep your urine pale yellow.  If you vomit, rehydrate by drinking water, juice, or clear broth. General instructions  If you have sleep apnea, surgery and certain medicines can increase your risk for breathing problems. Follow instructions from your health care provider about wearing your sleep device: ? Anytime you are sleeping, including during daytime naps. ? While taking prescription pain medicines, sleeping medicines, or medicines that make you drowsy.  Return to your normal activities as told by your health care provider. Ask your health care provider what activities are safe for you.  Take over-the-counter and prescription medicines only as told by your health care provider.  If you smoke, do not smoke without supervision.  Keep all follow-up visits as told by your health care provider. This is important. Contact a health care provider if:  You have nausea or vomiting that does not get better with medicine.  You cannot eat or drink without vomiting.  You have pain that does not get better with medicine.  You are unable to pass urine.  You develop a skin rash.  You have a fever.  You have redness around your IV site that gets worse. Get help right away if:  You have difficulty breathing.  You have chest pain.  You have blood in your urine or stool, or you vomit  blood. Summary  After the procedure, it is common to have a sore throat or nausea. It is also common to feel tired.  Have a responsible adult stay with you for the first 24 hours after general anesthesia. It is important to have someone help care for you until you are awake and alert.  When you feel hungry, start by eating small amounts of foods that are soft and easy to digest (bland), such as toast. Gradually return to your regular diet.  Drink enough fluid to keep your urine pale yellow.  Return to your normal activities as told by your health care provider. Ask your health care provider what activities are safe for you. This information is not intended to replace advice given to you by your health care provider. Make sure you discuss any questions you have with your health care provider. Document Revised: 05/30/2017 Document Reviewed: 01/10/2017 Elsevier Patient Education  2020 [employer].

## 2019-10-18 NOTE — Interval H&P Note (Signed)
History and Physical Interval Note:  10/18/2019 7:17 AM  Andrea Burns  has presented today for surgery, with the diagnosis of Appendicitis.  The various methods of treatment have been discussed with the patient and family. After consideration of risks, benefits and other options for treatment, the patient has consented to  Procedure(s): APPENDECTOMY LAPAROSCOPIC (N/A) as a surgical intervention.  The patient's history has been reviewed, patient examined, no change in status, stable for surgery.  I have reviewed the patient's chart and labs.  Questions were answered to the patient's satisfaction.    No changes. BP up. Has been off phenteremine.  [name]

## 2019-10-18 NOTE — Anesthesia Postprocedure Evaluation (Signed)
Anesthesia Post Note  Patient: Andrea Burns  Procedure(s) Performed: APPENDECTOMY LAPAROSCOPIC (N/A Abdomen)  Patient location during evaluation: PACU Anesthesia Type: General Level of consciousness: awake and alert Pain management: pain level controlled Vital Signs Assessment: post-procedure vital signs reviewed and stable Respiratory status: spontaneous breathing, nonlabored ventilation and respiratory function stable Cardiovascular status: stable Postop Assessment: no apparent nausea or vomiting Anesthetic complications: no     Last Vitals:  Vitals:   10/18/19 0652 10/18/19 0656  BP: (!) 156/103 (!) 160/99  Pulse: 71   Resp: 20   Temp: 36.9 C   SpO2: 99%     Last Pain:  Vitals:   10/18/19 0652  TempSrc: Oral                 Ryder Chesmore Hristova

## 2019-10-18 NOTE — Op Note (Signed)
[facility]  Date of Surgery: 10/18/2019  Admit Date: 10/18/2019   Performing Service: General  Surgeon(s) and Role:    * [name], MD - Primary   Pre-operative Diagnosis: History of Acute Appendicitis  Post-operative Diagnosis: Acute Appendicitis  Procedure Performed: Interval Laparoscopic Appendectomy   Surgeon: Leatrice Jewels. Henreitta Leber, MD   Assistant: No qualified resident was available.   Anesthesia: General   Findings:  The appendix was found to be dilated but no signs of continued inflammation. There were not signs of necrosis. There was not perforation. There was not abscess formation.   Estimated Blood Loss: Minimal   Specimens:  ID Type Source Tests Collected by Time Destination  1 : APPENDIX Tissue PATH Appendix SURGICAL PATHOLOGY [name], MD 10/18/2019 [phone]      Complications: None; patient tolerated the procedure well.   Disposition: PACU - hemodynamically stable.   Condition: stable   Indications: The patient presented last month with a history of acute onset of right-sided abdominal pain. A CT revealed findings consistent with acute appendicitis but she has been taking phentermine for weight loss and due to issues related to surgery we opted for antibiotic management. I saw her back in the clinic and she was continuing to have some RLQ pain and had been to the ED again. Repeat imaging showed less inflammation and less dilated appendix.  Given her continued symptoms, we opted to take out her appendix as she has been off the phentermine since her diagnosis.  Procedure Details  Prior to the procedure, the risks, benefits, complications, treatment options, and expected outcomes were discussed with the patient and/or family, including but not limited to the risk of bleeding, infection, finding of a normal appendix, and the need for conversion to an open procedure. There was concurrence with the proposed plan and informed consent was  obtained. The patient was taken to the operating room, identified as Andrea Burns and the procedure verified as Laproscopic Appendectomy.    The patient was placed in the supine position and general anesthesia was induced, along with placement of orogastric tube, SCD's, and a Foley catheter. The abdomen was prepped and draped in a sterile fashion. The abdomen was entered with Veress technique in the infraumbilical incision. Intraperitoneal placement was confirmed with saline drop, low entry pressures, and easy insufflation. A 11 mm optiview trocar was placed under direct visualization with a 0 degree scope. The 10 mm 0 degree scope was placed in the abdomen and no evidence of injury was identified. A 12 mm port was placed in the left lower quadrant of the abdomen after skin incision with trocar placement under direct vision. A careful evaluation of the entire abdomen was carried out. An additional 5 mm port was placed in the suprapubic area under direct vision.  The patient was placed in Trendelenburg and left lateral decubitus position. The small intestines were retracted in the cephalad and left lateral direction away from the pelvis and right lower quadrant. The patient was found to have a dilated appendix. There was not evidence of perforation.   The appendix was carefully dissected. A window was made in the mesoappendix at the base of the appendix. The appendix was divided at its base using another tan endo-GIA stapler. Minimal appendiceal stump was left in place. The mesoappendix was taken with the harmonic energy device. The appendix was placed within an Endocatch specimen bag. There was no evidence of bleeding, leakage, or complication after division of the appendix.  Any  remaining blood or pus was suctioned out from the abdomen, hemostasis was confirmed. The endocatch bag was removed via the 12 mm port, then the abdomen desufflated. The appendix was passed off the field as a specimen.   The  the 12 mm and 10 mm port sites were closed with a 0 Vicryl suture. The trocar site skin wounds were closed using subcuticular 4-0 Monocryl suture and dermabond. The patient was then awakened from general anesthesia, extubated, and taken to PACU for recovery.   Instrument, sponge, and needle counts were correct at the conclusion of the case.   Curlene Labrum, MD [facility] [address] [place], [place] [address] [phone](EBRAXE)

## 2019-10-18 NOTE — Anesthesia Procedure Notes (Signed)
Procedure Name: Intubation Date/Time: 10/18/2019 7:44 AM Performed by: Hewitt Blade, CRNA Pre-anesthesia Checklist: Patient identified, Emergency Drugs available, Suction available and Patient being monitored Patient Re-evaluated:Patient Re-evaluated prior to induction Oxygen Delivery Method: Circle system utilized Preoxygenation: Pre-oxygenation with 100% oxygen Induction Type: IV induction Ventilation: Mask ventilation without difficulty Laryngoscope Size: Mac and 3 Grade View: Grade I Tube type: Oral Tube size: 7.0 mm Number of attempts: 1 Airway Equipment and Method: Stylet Placement Confirmation: ETT inserted through vocal cords under direct vision,  positive ETCO2 and breath sounds checked- equal and bilateral Secured at: 21 cm Tube secured with: Tape Dental Injury: Teeth and Oropharynx as per pre-operative assessment

## 2019-10-18 NOTE — Progress Notes (Signed)
[facility]  Notified husband surgery completed. Additional Rx for Roxicodone 5 mg q 4 PRN (10 tablets) sent to [facility].   Algis Greenhouse, MD [facility] [address] [place] [place], [place] [address] [phone] (office)

## 2019-10-18 NOTE — Anesthesia Preprocedure Evaluation (Signed)
Anesthesia Evaluation  Patient identified by MRN, date of birth, ID band Patient awake    Reviewed: Allergy & Precautions, NPO status , Patient's Chart, lab work & pertinent test results  History of Anesthesia Complications Negative for: history of anesthetic complications  Airway Mallampati: III  TM Distance: >3 FB Neck ROM: Full    Dental  (+) Teeth Intact, Dental Advisory Given Fillings :   Pulmonary pneumonia, former smoker,    Pulmonary exam normal breath sounds clear to auscultation       Cardiovascular METS: 3 - Mets hypertension (non complaint with medication), Normal cardiovascular exam Rhythm:Regular Rate:Normal     Neuro/Psych  Headaches, PSYCHIATRIC DISORDERS (not on meds) Anxiety Depression    GI/Hepatic GERD  Controlled,(+)     substance abuse (used 1 week ago, urine drug screen - negative )  cocaine use,   Endo/Other  negative endocrine ROS  Renal/GU negative Renal ROS     Musculoskeletal   Abdominal   Peds  Hematology negative hematology ROS (+)   Anesthesia Other Findings Gets chest pain when patient is anxious.  Reproductive/Obstetrics                            Anesthesia Physical Anesthesia Plan  ASA: III  Anesthesia Plan: General   Post-op Pain Management:    Induction: Intravenous  PONV Risk Score and Plan: 4 or greater and Ondansetron, Dexamethasone, Midazolam and Scopolamine patch - Pre-op  Airway Management Planned: Oral ETT  Additional Equipment:   Intra-op Plan:   Post-operative Plan: Extubation in OR  Informed Consent: I have reviewed the patients History and Physical, chart, labs and discussed the procedure including the risks, benefits and alternatives for the proposed anesthesia with the patient or authorized representative who has indicated his/her understanding and acceptance.     Dental advisory given  Plan Discussed with: CRNA and  Surgeon  Anesthesia Plan Comments: (Risk of heart attack and stroke was explained to the patient.)       Anesthesia Quick Evaluation

## 2019-10-18 NOTE — Transfer of Care (Signed)
Immediate Anesthesia Transfer of Care Note  Patient: Andrea Burns  Procedure(s) Performed: APPENDECTOMY LAPAROSCOPIC (N/A Abdomen)  Patient Location: PACU  Anesthesia Type:General  Level of Consciousness: awake  Airway & Oxygen Therapy: Patient Spontanous Breathing  Post-op Assessment: Report given to RN and Post -op Vital signs reviewed and stable  Post vital signs: Reviewed and stable  Last Vitals:  Vitals Value Taken Time  BP 160/102 10/18/19 0900  Temp    Pulse 77 10/18/19 0912  Resp 25 10/18/19 0912  SpO2 100 % 10/18/19 0912  Vitals shown include unvalidated device data.  Last Pain:  Vitals:   10/18/19 0652  TempSrc: Oral         Complications: No apparent anesthesia complications

## 2019-10-19 LAB — SURGICAL PATHOLOGY

## 2019-10-19 NOTE — Addendum Note (Signed)
Addendum  created 10/19/19 1110 by Moshe Salisbury, CRNA   Charge Capture section accepted, Visit diagnoses modified

## 2019-10-21 ENCOUNTER — Telehealth (INDEPENDENT_AMBULATORY_CARE_PROVIDER_SITE_OTHER): Payer: Self-pay

## 2019-10-21 DIAGNOSIS — K3580 Unspecified acute appendicitis: Secondary | ICD-10-CM

## 2019-10-21 NOTE — Telephone Encounter (Signed)
[facility]  Patient s/p laparoscopic appendectomy reports a rash POD 9-10? Very unusual. She had been prescribed antibiotics preop but only started these post op. Maybe the Augmentin?   Told her to use mild soaps and detergent, benadryl and lotion as needed. Stop antibiotic.  If needs to be seen in person call the office.   Algis Greenhouse, MD [facility] [address] [place] [place], [place] [address] [phone] (office)

## 2019-10-21 NOTE — Telephone Encounter (Signed)
Patient called and states that she has started developing a rash. It started out as just a few small red bumps around her bra line about 2 days ago. She now reports that it is all over her abdomen and back and down her thighs. She reports that it is a little itchy. She denies any fever, chills, or shortness of breath. She has been taking her Amoxicillin as prescribed. She reports that she has only need to take 2 doses of her Oxycodone.   Will notify Dr Henreitta Leber as to what the patient should do.

## 2019-11-02 ENCOUNTER — Telehealth (INDEPENDENT_AMBULATORY_CARE_PROVIDER_SITE_OTHER): Payer: Self-pay | Admitting: General Surgery

## 2019-11-02 DIAGNOSIS — K3580 Unspecified acute appendicitis: Secondary | ICD-10-CM

## 2019-11-02 NOTE — Progress Notes (Signed)
[facility]  I am calling the patient for post operative evaluation due to the current COVID 19 pandemic.  The patient had a laparoscopic appendectomy on 10/18/2019. The patient reports that they are doing ok. The are tolerating a diet, having good pain control, and having some irregular BMs at times. I recommended probiotic / yogurt to restore her gut flora.  The patient has no other concerns.  Her incisions healing well.   Diet and activity as tolerated.  She is going to return to work Thursday.   Will see the patient PRN.   FINAL MICROSCOPIC DIAGNOSIS:   A. APPENDIX, APPENDECTOMY:  - Acute appendicitis.   Algis Greenhouse, MD [facility] [address] [place] [place], [place] [address] [phone] (office)

## 2020-01-17 ENCOUNTER — Ambulatory Visit: Payer: Medicaid Other | Admitting: Obstetrics

## 2020-01-31 ENCOUNTER — Encounter: Payer: Self-pay | Admitting: Obstetrics

## 2020-01-31 ENCOUNTER — Other Ambulatory Visit: Payer: Self-pay

## 2020-01-31 ENCOUNTER — Ambulatory Visit (INDEPENDENT_AMBULATORY_CARE_PROVIDER_SITE_OTHER): Payer: Medicaid Other | Admitting: Obstetrics

## 2020-01-31 ENCOUNTER — Other Ambulatory Visit (HOSPITAL_COMMUNITY)
Admission: RE | Admit: 2020-01-31 | Discharge: 2020-01-31 | Disposition: A | Discharge status: Home / Self Care | Payer: Medicaid Other | Source: Ambulatory Visit | Attending: Obstetrics | Admitting: Obstetrics

## 2020-01-31 VITALS — BP 138/92 | Ht 62.0 in | Wt 180.8 lb

## 2020-01-31 DIAGNOSIS — Z3202 Encounter for pregnancy test, result negative: Secondary | ICD-10-CM

## 2020-01-31 DIAGNOSIS — K5901 Slow transit constipation: Secondary | ICD-10-CM

## 2020-01-31 DIAGNOSIS — Z113 Encounter for screening for infections with a predominantly sexual mode of transmission: Secondary | ICD-10-CM

## 2020-01-31 DIAGNOSIS — N898 Other specified noninflammatory disorders of vagina: Secondary | ICD-10-CM | POA: Diagnosis not present

## 2020-01-31 DIAGNOSIS — Z01419 Encounter for gynecological examination (general) (routine) without abnormal findings: Secondary | ICD-10-CM | POA: Diagnosis not present

## 2020-01-31 DIAGNOSIS — Z30011 Encounter for initial prescription of contraceptive pills: Secondary | ICD-10-CM | POA: Diagnosis not present

## 2020-01-31 LAB — POCT URINE PREGNANCY: Preg Test, Ur: NEGATIVE

## 2020-01-31 MED ORDER — DOCUSATE SODIUM 100 MG PO CAPS: 100.0000 mg | ORAL_CAPSULE | Freq: Two times a day (BID) | ORAL | 2 refills | Status: DC

## 2020-01-31 MED ORDER — SLYND 4 MG PO TABS: 1.0000 | ORAL_TABLET | Freq: Every day | ORAL | 11 refills | Status: DC

## 2020-01-31 MED ORDER — MILK OF MAGNESIA 7.75 % PO SUSP: 30.0000 mL | Freq: Every evening | ORAL | 5 refills | Status: DC | PRN

## 2020-01-31 NOTE — Progress Notes (Signed)
Last pap: 08/27/2018 Pt. Request UPT  Pt. Request STI testing

## 2020-01-31 NOTE — Progress Notes (Signed)
Subjective:        Andrea Burns is a 34 y.o. female here for a routine exam.  Current complaints: Some discomfort from recent appendectomy..    Personal health questionnaire:  Is patient Andrea Burns, have a family history of breast and/or ovarian cancer: no Is there a family history of uterine cancer diagnosed at age < 56, gastrointestinal cancer, urinary tract cancer, family member who is a Personnel officer syndrome-associated carrier: no Is the patient overweight and hypertensive, family history of diabetes, personal history of gestational diabetes, preeclampsia or PCOS: yes Is patient over 70, have PCOS,  family history of premature CHD under age 62, diabetes, smoke, have hypertension or peripheral artery disease:  no At any time, has a partner hit, kicked or otherwise hurt or frightened you?: no Over the past 2 weeks, have you felt down, depressed or hopeless?: no Over the past 2 weeks, have you felt little interest or pleasure in doing things?:no   Gynecologic History Patient's last menstrual period was 01/25/2020 (approximate). Contraception: oral progesterone-only contraceptive Last Pap: 08-27-2018. Results were: normal Last mammogram: n/a. Results were: n/a  Obstetric History OB History  Gravida Para Term Preterm AB Living  5 4 4   1 4   SAB TAB Ectopic Multiple Live Births  1       4    # Outcome Date GA Lbr Len/2nd Weight Sex Delivery Anes PTL Lv  5 Term 05/10/13 [redacted]w[redacted]d 35:30 / 00:36 6 lb 15.3 oz (3.155 kg) M Vag-Spont Local  LIV  4 Term 04/23/09 [redacted]w[redacted]d  8 lb (3.629 kg) M Vag-Spont None  LIV     Birth Comments: No complications  3 Term 02/28/07 [redacted]w[redacted]d  7 lb 5 oz (3.317 kg) M Vag-Spont None  LIV     Birth Comments: No complications  2 Term 05/16/04 [redacted]w[redacted]d  7 lb 11 oz (3.487 kg) F Vag-Spont None  LIV     Birth Comments: No complications  1 SAB             Past Medical History:  Diagnosis Date  . Anxiety   . Depression   . GERD (gastroesophageal reflux disease)   .  HA (headache)   . 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 Medical history non-contributory   . Pneumonia    2013    Past Surgical History:  Procedure Laterality Date  . COLPOSCOPY W/ BIOPSY / CURETTAGE    . IUD REMOVAL    . LAPAROSCOPIC APPENDECTOMY N/A 10/18/2019   Procedure: APPENDECTOMY LAPAROSCOPIC;  Surgeon: 12/18/2019, MD;  Location: [facility];  Service: General;  Laterality: N/A;  . LAPAROSCOPY ABDOMEN DIAGNOSTIC     Removal of migrated IUD   . NO PAST SURGERIES       Current Outpatient Medications:  .  docusate sodium (COLACE) 100 MG capsule, Take 1 capsule (100 mg total) by mouth 2 (two) times daily., Disp: 60 capsule, Rfl: 2 .  Drospirenone (SLYND) 4 MG TABS, Take 1 tablet by mouth daily., Disp: 28 tablet, Rfl: 11 .  magnesium hydroxide (MILK OF MAGNESIA) 400 MG/5ML suspension, Take 30 mLs by mouth at bedtime as needed for mild constipation or moderate constipation., Disp: 355 mL, Rfl: 5 .  ondansetron (ZOFRAN) 4 MG tablet, Take 1 tablet (4 mg total) by mouth daily as needed for nausea or vomiting. (Patient not taking: Reported on 01/31/2020), Disp: 30 tablet, Rfl: 1 .  oxyCODONE (ROXICODONE) 5 MG immediate release tablet, Take 1  tablet (5 mg total) by mouth every 4 (four) hours as needed for severe pain or breakthrough pain. (Patient not taking: Reported on 01/31/2020), Disp: 10 tablet, Rfl: 0 .  polyethylene glycol (MIRALAX / GLYCOLAX) 17 g packet, Take 17 g by mouth daily as needed for moderate constipation. (Patient not taking: Reported on 01/31/2020), Disp: , Rfl:  No Known Allergies  Social History   Tobacco Use  . Smoking status: Former Smoker    Packs/day: 0.25    Years: 1.00    Pack years: 0.25    Quit date: 05/03/2005    Years since quitting: 14.7  . Smokeless tobacco: Never Used  Substance Use Topics  . Alcohol use: No    Alcohol/week: 0.0 standard drinks    Family History  Problem Relation Age of Onset  . Diabetes  Mother   . Hypertension Mother   . Cancer Mother   . Diabetes Father   . Cancer Paternal Grandmother        liver & lung      Review of Systems  Constitutional: negative for fatigue and weight loss Respiratory: negative for cough and wheezing Cardiovascular: negative for chest pain, fatigue and palpitations Gastrointestinal: negative for abdominal pain and change in bowel habits Musculoskeletal:negative for myalgias Neurological: negative for gait problems and tremors Behavioral/Psych: negative for abusive relationship, depression Endocrine: negative for temperature intolerance    Genitourinary:negative for abnormal menstrual periods, genital lesions, hot flashes, sexual problems and vaginal discharge Integument/breast: negative for breast lump, breast tenderness, nipple discharge and skin lesion(s)    Objective:       BP (!) 138/92   Ht 5\' 2"  (1.575 m)   Wt 180 lb 12.8 oz (82 kg)   LMP 01/25/2020 (Approximate)   BMI 33.07 kg/m  General:   alert  Skin:   no rash or abnormalities  Lungs:   clear to auscultation bilaterally  Heart:   regular rate and rhythm, S1, S2 normal, no murmur, click, rub or gallop  Breasts:   normal without suspicious masses, skin or nipple changes or axillary nodes  Abdomen:  normal findings: no organomegaly, soft, non-tender and no hernia  Pelvis:  External genitalia: normal general appearance Urinary system: urethral meatus normal and bladder without fullness, nontender Vaginal: normal without tenderness, induration or masses Cervix: normal appearance Adnexa: normal bimanual exam Uterus: anteverted and non-tender, normal size   Lab Review Urine pregnancy test Labs reviewed yes Radiologic studies reviewed no  50% of 20 min visit spent on counseling and coordination of care.   Assessment:    1. Encounter for annual routine gynecological examination Rx: - POCT urine pregnancy - Cytology - PAP  2. Vaginal discharge Rx: - Cervicovaginal  ancillary only  3. Screen for STD (sexually transmitted disease) Rx: - HIV Antibody (routine testing w rflx) - Hepatitis B surface antigen - RPR - Hepatitis C antibody  4. Encounter for initial prescription of contraceptive pills Rx: - Drospirenone (SLYND) 4 MG TABS; Take 1 tablet by mouth daily.  Dispense: 28 tablet; Refill: 11  5. Constipation by delayed colonic transit Rx: - docusate sodium (COLACE) 100 MG capsule; Take 1 capsule (100 mg total) by mouth 2 (two) times daily.  Dispense: 60 capsule; Refill: 2 - magnesium hydroxide (MILK OF MAGNESIA) 400 MG/5ML suspension; Take 30 mLs by mouth at bedtime as needed for mild constipation or moderate constipation.  Dispense: 355 mL; Refill: 5   Plan:    Education reviewed: calcium supplements, depression evaluation, low fat, low cholesterol diet,  safe sex/STD prevention, self breast exams, skin cancer screening and weight bearing exercise. Contraception: oral progesterone-only contraceptive. Follow up in: 1 year.   Meds ordered this encounter  Medications  . Drospirenone (SLYND) 4 MG TABS    Sig: Take 1 tablet by mouth daily.    Dispense:  28 tablet    Refill:  11  . docusate sodium (COLACE) 100 MG capsule    Sig: Take 1 capsule (100 mg total) by mouth 2 (two) times daily.    Dispense:  60 capsule    Refill:  2  . magnesium hydroxide (MILK OF MAGNESIA) 400 MG/5ML suspension    Sig: Take 30 mLs by mouth at bedtime as needed for mild constipation or moderate constipation.    Dispense:  355 mL    Refill:  5   Orders Placed This Encounter  Procedures  . HIV Antibody (routine testing w rflx)  . Hepatitis B surface antigen  . RPR  . Hepatitis C antibody  . POCT urine pregnancy    Brock Bad, MD 01/31/2020 12:45 PM

## 2020-02-01 LAB — CERVICOVAGINAL ANCILLARY ONLY
Bacterial Vaginitis (gardnerella): NEGATIVE
Candida Glabrata: NEGATIVE
Candida Vaginitis: POSITIVE — AB
Chlamydia: NEGATIVE
Comment: NEGATIVE
Comment: NEGATIVE
Comment: NEGATIVE
Comment: NEGATIVE
Comment: NEGATIVE
Comment: NORMAL
Neisseria Gonorrhea: NEGATIVE
Trichomonas: NEGATIVE

## 2020-02-01 LAB — HEPATITIS C ANTIBODY: Hep C Virus Ab: <0.1 s/co ratio (ref 0.0–0.9)

## 2020-02-01 LAB — RPR: RPR Ser Ql: NONREACTIVE

## 2020-02-01 LAB — HEPATITIS B SURFACE ANTIGEN: Hepatitis B Surface Ag: NEGATIVE

## 2020-02-01 LAB — HIV ANTIBODY (ROUTINE TESTING W REFLEX): HIV Screen 4th Generation wRfx: NONREACTIVE

## 2020-02-02 ENCOUNTER — Other Ambulatory Visit: Payer: Self-pay | Admitting: Obstetrics

## 2020-02-02 LAB — CYTOLOGY - PAP
Comment: NEGATIVE
Diagnosis: REACTIVE
High risk HPV: NEGATIVE

## 2020-04-24 ENCOUNTER — Emergency Department (HOSPITAL_COMMUNITY): Payer: Self-pay

## 2020-04-24 ENCOUNTER — Other Ambulatory Visit: Payer: Self-pay

## 2020-04-24 ENCOUNTER — Emergency Department (HOSPITAL_COMMUNITY)
Admission: EM | Admit: 2020-04-24 | Discharge: 2020-04-24 | Disposition: A | Discharge status: Home / Self Care | Payer: Self-pay | Attending: Emergency Medicine | Admitting: Emergency Medicine

## 2020-04-24 ENCOUNTER — Encounter (HOSPITAL_COMMUNITY): Payer: Self-pay

## 2020-04-24 DIAGNOSIS — R1030 Lower abdominal pain, unspecified: Secondary | ICD-10-CM | POA: Insufficient documentation

## 2020-04-24 DIAGNOSIS — O26891 Other specified pregnancy related conditions, first trimester: Secondary | ICD-10-CM | POA: Insufficient documentation

## 2020-04-24 DIAGNOSIS — Z79899 Other long term (current) drug therapy: Secondary | ICD-10-CM | POA: Insufficient documentation

## 2020-04-24 DIAGNOSIS — Z87891 Personal history of nicotine dependence: Secondary | ICD-10-CM | POA: Insufficient documentation

## 2020-04-24 DIAGNOSIS — Z3A01 Less than 8 weeks gestation of pregnancy: Secondary | ICD-10-CM | POA: Insufficient documentation

## 2020-04-24 LAB — CBC WITH DIFFERENTIAL/PLATELET
Abs Immature Granulocytes: 0.04 10*3/uL (ref 0.00–0.07)
Basophils Absolute: 0.1 10*3/uL (ref 0.0–0.1)
Basophils Relative: 1 %
Eosinophils Absolute: 0.3 10*3/uL (ref 0.0–0.5)
Eosinophils Relative: 3 %
HCT: 38.9 % (ref 36.0–46.0)
Hemoglobin: 13.1 g/dL (ref 12.0–15.0)
Immature Granulocytes: 0 %
Lymphocytes Relative: 26 %
Lymphs Abs: 2.4 10*3/uL (ref 0.7–4.0)
MCH: 30 pg (ref 26.0–34.0)
MCHC: 33.7 g/dL (ref 30.0–36.0)
MCV: 89.2 fL (ref 80.0–100.0)
Monocytes Absolute: 0.5 10*3/uL (ref 0.1–1.0)
Monocytes Relative: 6 %
Neutro Abs: 6.2 10*3/uL (ref 1.7–7.7)
Neutrophils Relative %: 64 %
Platelets: 190 10*3/uL (ref 150–400)
RBC: 4.36 MIL/uL (ref 3.87–5.11)
RDW: 12.2 % (ref 11.5–15.5)
WBC: 9.5 10*3/uL (ref 4.0–10.5)
nRBC: 0 % (ref 0.0–0.2)

## 2020-04-24 LAB — URINALYSIS, ROUTINE W REFLEX MICROSCOPIC
Bacteria, UA: NONE SEEN
Bilirubin Urine: NEGATIVE
Glucose, UA: NEGATIVE mg/dL
Ketones, ur: NEGATIVE mg/dL
Leukocytes,Ua: NEGATIVE
Nitrite: NEGATIVE
Protein, ur: NEGATIVE mg/dL
Specific Gravity, Urine: 1.013 (ref 1.005–1.030)
pH: 6 (ref 5.0–8.0)

## 2020-04-24 LAB — PREGNANCY, URINE: Preg Test, Ur: POSITIVE — AB

## 2020-04-24 LAB — COMPREHENSIVE METABOLIC PANEL
ALT: 14 U/L (ref 0–44)
AST: 16 U/L (ref 15–41)
Albumin: 4 g/dL (ref 3.5–5.0)
Alkaline Phosphatase: 60 U/L (ref 38–126)
Anion gap: 8 (ref 5–15)
BUN: 14 mg/dL (ref 6–20)
CO2: 25 mmol/L (ref 22–32)
Calcium: 8.8 mg/dL — ABNORMAL LOW (ref 8.9–10.3)
Chloride: 103 mmol/L (ref 98–111)
Creatinine, Ser: 1.01 mg/dL — ABNORMAL HIGH (ref 0.44–1.00)
GFR, Estimated: >60 mL/min (ref >60)
Glucose, Bld: 77 mg/dL (ref 70–99)
Potassium: 3 mmol/L — ABNORMAL LOW (ref 3.5–5.1)
Sodium: 136 mmol/L (ref 135–145)
Total Bilirubin: 0.4 mg/dL (ref 0.3–1.2)
Total Protein: 7 g/dL (ref 6.5–8.1)

## 2020-04-24 LAB — HCG, QUANTITATIVE, PREGNANCY: hCG, Beta Chain, Quant, S: 2368 m[IU]/mL — ABNORMAL HIGH (ref <5)

## 2020-04-24 NOTE — ED Provider Notes (Signed)
[facility] EMERGENCY DEPARTMENT Provider Note   CSN: [id number] Arrival date & time: 04/24/20  7371     History Chief Complaint  Patient presents with  . Abdominal Pain    Andrea Burns is a 34 y.o. female.  The history is provided by the patient. No language interpreter was used.  Abdominal Pain Pain location:  Suprapubic Pain quality: aching   Pain radiates to:  Does not radiate Pain severity:  Moderate Timing:  Constant Chronicity:  New Relieved by:  Nothing Worsened by:  Nothing Ineffective treatments:  None tried Associated symptoms: nausea    Pt complains of irregular periods, mood swings and lower abdominal discomfort.     Past Medical History:  Diagnosis Date  . Anxiety   . Depression   . GERD (gastroesophageal reflux disease)   . HA (headache)   . 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 Medical history non-contributory   . Pneumonia    2013    Patient Active Problem List   Diagnosis Date Noted  . Acute appendicitis   . Restless leg 12/24/2013  . HA (headache)   . Urinary tract infection, site not specified 11/08/2013  . Migraine headache with aura 11/08/2013  . Abnormal uterine bleeding (AUB) 11/08/2013  . Gastroenteritis, acute 11/08/2013  . Postpartum depression 08/12/2013  . Depressive disorder 08/12/2013  . Active labor 05/10/2013  . Normal delivery 05/10/2013  . Rapid first stage of labor 05/10/2013  . Abnormal genetic test in pregnancy 12/14/2012  . Papanicolaou smear of cervix with low grade squamous intraepithelial lesion (LGSIL) 10/19/2012  . Asymptomatic bacteriuria in pregnancy 09/21/2012  . 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 Supervision of other normal pregnancy 09/17/2012    Past Surgical History:  Procedure Laterality Date  . COLPOSCOPY W/ BIOPSY / CURETTAGE    . IUD REMOVAL    . LAPAROSCOPIC APPENDECTOMY N/A 10/18/2019   Procedure: APPENDECTOMY LAPAROSCOPIC;  Surgeon: [name], MD;   Location: [facility];  Service: General;  Laterality: N/A;  . LAPAROSCOPY ABDOMEN DIAGNOSTIC     Removal of migrated IUD   . NO PAST SURGERIES       OB History    Gravida  5   Para  4   Term  4   Preterm      AB  1   Living  4     SAB  1   TAB      Ectopic      Multiple      Live Births  4           Family History  Problem Relation Age of Onset  . Diabetes Mother   . Hypertension Mother   . Cancer Mother   . Diabetes Father   . Cancer Paternal Grandmother        liver & lung    Social History   Tobacco Use  . Smoking status: Former Smoker    Packs/day: 0.25    Years: 1.00    Pack years: 0.25    Quit date: 05/03/2005    Years since quitting: 14.9  . Smokeless tobacco: Never Used  Vaping Use  . Vaping Use: Never used  Substance Use Topics  . Alcohol use: No    Alcohol/week: 0.0 standard drinks  . Drug use: Not Currently    Types: Cocaine    Comment: last used 3 to 4 months ago    Home Medications Prior to Admission medications   Medication  Sig Start Date End Date Taking? Authorizing Provider  docusate sodium (COLACE) 100 MG capsule Take 1 capsule (100 mg total) by mouth 2 (two) times daily. 01/31/20 01/30/21  Brock Bad, MD  Drospirenone (SLYND) 4 MG TABS Take 1 tablet by mouth daily. 01/31/20   Brock Bad, MD  magnesium hydroxide (MILK OF MAGNESIA) 400 MG/5ML suspension Take 30 mLs by mouth at bedtime as needed for mild constipation or moderate constipation. 01/31/20   Brock Bad, MD  ondansetron (ZOFRAN) 4 MG tablet Take 1 tablet (4 mg total) by mouth daily as needed for nausea or vomiting. Patient not taking: Reported on 01/31/2020 09/16/19 09/15/20  [name], MD  oxyCODONE (ROXICODONE) 5 MG immediate release tablet Take 1 tablet (5 mg total) by mouth every 4 (four) hours as needed for severe pain or breakthrough pain. Patient not taking: Reported on 01/31/2020 10/18/19   [name], MD  polyethylene glycol (MIRALAX /  GLYCOLAX) 17 g packet Take 17 g by mouth daily as needed for moderate constipation. Patient not taking: Reported on 01/31/2020    [provider]    Allergies    Patient has no known allergies.  Review of Systems   Review of Systems  Gastrointestinal: Positive for abdominal pain and nausea.  All other systems reviewed and are negative.   Physical Exam Updated Vital Signs BP (!) 150/93 (BP Location: Right Arm)   Pulse 76   Temp 98.1 F (36.7 C) (Oral)   Resp 16   Ht 5\' 2"  (1.575 m)   Wt 84.8 kg   SpO2 100%   BMI 34.20 kg/m   Physical Exam Vitals and nursing note reviewed.  Constitutional:      Appearance: She is well-developed.  HENT:     Head: Normocephalic.  Cardiovascular:     Rate and Rhythm: Normal rate.     Heart sounds: Normal heart sounds.  Pulmonary:     Effort: Pulmonary effort is normal.  Abdominal:     General: Abdomen is flat. Bowel sounds are normal. There is no distension.     Palpations: Abdomen is soft.     Tenderness: There is no abdominal tenderness.  Musculoskeletal:        General: Normal range of motion.     Cervical back: Normal range of motion.  Skin:    General: Skin is warm.     Capillary Refill: Capillary refill takes less than 2 seconds.  Neurological:     Mental Status: She is alert and oriented to person, place, and time.     ED Results / Procedures / Treatments   Labs (all labs ordered are listed, but only abnormal results are displayed) Labs Reviewed  URINALYSIS, ROUTINE W REFLEX MICROSCOPIC - Abnormal; Notable for the following components:      Result Value   Color, Urine STRAW (*)    Hgb urine dipstick SMALL (*)    All other components within normal limits  PREGNANCY, URINE - Abnormal; Notable for the following components:   Preg Test, Ur POSITIVE (*)    All other components within normal limits  COMPREHENSIVE METABOLIC PANEL - Abnormal; Notable for the following components:   Potassium 3.0 (*)    Creatinine,  Ser 1.01 (*)    Calcium 8.8 (*)    All other components within normal limits  HCG, QUANTITATIVE, PREGNANCY - Abnormal; Notable for the following components:   hCG, Beta Chain, Quant, S 2,368 (*)    All other components within  normal limits  CBC WITH DIFFERENTIAL/PLATELET    EKG EKG Interpretation  Date/Time:  Monday April 24 2020 10:14:23 EST Ventricular Rate:  82 PR Interval:  130 QRS Duration: 84 QT Interval:  366 QTC Calculation: 427 R Axis:   47 Text Interpretation: Normal sinus rhythm Nonspecific ST and T wave abnormality Abnormal ECG No significant change since last tracing Confirmed by Jacalyn Lefevre [phone]) on 04/24/2020 11:04:39 AM   Radiology US OB Transvaginal  Result Date: 04/24/2020 CLINICAL DATA:  Pelvic pain for 1 day. Positive pregnancy test. Unsure of LMP. EXAM: TRANSVAGINAL OB ULTRASOUND TECHNIQUE: Transvaginal ultrasound was performed for complete evaluation of the gestation as well as the maternal uterus, adnexal regions, and pelvic cul-de-sac. COMPARISON:  None. FINDINGS: Intrauterine gestational sac: None Maternal uterus/adnexae: No fibroids identified. Both ovaries are normal in appearance. No mass or abnormal free fluid identified. IMPRESSION: Pregnancy of unknown anatomic location (no intrauterine gestational sac or adnexal mass identified). Differential diagnosis includes recent spontaneous abortion, IUP too early to visualize, and non-visualized ectopic pregnancy. Recommend correlation with serial beta-hCG levels, and follow up US if warranted clinically. Electronically Signed   By: Danae Orleans M.D.   On: 04/24/2020 14:46    Procedures Procedures (including critical care time)  Medications Ordered in ED Medications - No data to display  ED Course  I have reviewed the triage vital signs and the nursing notes.  Pertinent labs & imaging results that were available during my care of the patient were reviewed by me and considered in my medical decision  making (see chart for details).    MDM Rules/Calculators/A&P                          MDM:  Pt advised she needs repeat Qhcg in 48 hours. I spoke to MAU provider Np who advised pt should follow up with her gyn.  Pt advised to call Dr. Thomes Lolling office to schedule appointment for 2 day recheck.  Return if any problems. Pt counseled on possoble ectopic vs early pregnancy  Final Clinical Impression(s) / ED Diagnoses Final diagnoses:  Less than [redacted] weeks gestation of pregnancy    Rx / DC Orders ED Discharge Orders    None    An After Visit Summary was printed and given to the patient.    Elson Areas, [place] 04/24/20 1731    Gerhard Munch, MD 04/24/20 [phone]

## 2020-04-24 NOTE — Discharge Instructions (Signed)
You will need further evaluation to make sure you do not have an ectopic pregnancy.

## 2020-04-24 NOTE — ED Triage Notes (Signed)
Pt reports pain feeling tired a lot, lower abd pain, constipation, chest pain and back pain.  Reports takes metamucil to help her have a bm.  Reports recently had a bm and saw something "strange" in her stool.  Also reports feels like food gets stuck in her throat.

## 2020-04-26 ENCOUNTER — Ambulatory Visit (INDEPENDENT_AMBULATORY_CARE_PROVIDER_SITE_OTHER): Payer: Self-pay

## 2020-04-26 ENCOUNTER — Encounter: Payer: Self-pay | Admitting: Obstetrics

## 2020-04-26 ENCOUNTER — Ambulatory Visit (INDEPENDENT_AMBULATORY_CARE_PROVIDER_SITE_OTHER): Payer: Self-pay | Admitting: Obstetrics

## 2020-04-26 ENCOUNTER — Other Ambulatory Visit: Payer: Self-pay

## 2020-04-26 VITALS — BP 169/105 | HR 67 | Ht 64.0 in | Wt 161.0 lb

## 2020-04-26 DIAGNOSIS — Z3490 Encounter for supervision of normal pregnancy, unspecified, unspecified trimester: Secondary | ICD-10-CM

## 2020-04-26 DIAGNOSIS — O3680X0 Pregnancy with inconclusive fetal viability, not applicable or unspecified: Secondary | ICD-10-CM

## 2020-04-26 DIAGNOSIS — I10 Essential (primary) hypertension: Secondary | ICD-10-CM

## 2020-04-26 DIAGNOSIS — Z3687 Encounter for antenatal screening for uncertain dates: Secondary | ICD-10-CM

## 2020-04-26 MED ORDER — NIFEDIPINE ER OSMOTIC RELEASE 30 MG PO TB24: 30.0000 mg | ORAL_TABLET | Freq: Every day | ORAL | 11 refills | Status: DC

## 2020-04-26 MED ORDER — LABETALOL HCL 200 MG PO TABS: 200.0000 mg | ORAL_TABLET | Freq: Two times a day (BID) | ORAL | 3 refills | Status: DC

## 2020-04-26 NOTE — Progress Notes (Signed)
Patient ID: Andrea Burns, female   DOB: March 31, 1986, 34 y.o.   MRN: [id number]  Chief Complaint  Patient presents with  . Possible Pregnancy    follow up     HPI Andrea Burns is a 34 y.o. female.  Patient presents after being seen in the [facility] ER for lower abdominal pain, mild.  Evaluation revealed an early pregnancy and elevated BP's.  She had no vaginal bleeding.  Quantitative beta hcg done and she was referred here for follow up.  Today, she has no pain or vaginal bleeding. HPI  Past Medical History:  Diagnosis Date  . Anxiety   . Depression   . GERD (gastroesophageal reflux disease)   . HA (headache)   . 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 Medical history non-contributory   . Pneumonia    2013    Past Surgical History:  Procedure Laterality Date  . COLPOSCOPY W/ BIOPSY / CURETTAGE    . IUD REMOVAL    . LAPAROSCOPIC APPENDECTOMY N/A 10/18/2019   Procedure: APPENDECTOMY LAPAROSCOPIC;  Surgeon: [name], MD;  Location: [facility];  Service: General;  Laterality: N/A;  . LAPAROSCOPY ABDOMEN DIAGNOSTIC     Removal of migrated IUD   . NO PAST SURGERIES      Family History  Problem Relation Age of Onset  . Diabetes Mother   . Hypertension Mother   . Cancer Mother   . Diabetes Father   . Cancer Paternal Grandmother        liver & lung    Social History Social History   Tobacco Use  . Smoking status: Former Smoker    Packs/day: 0.25    Years: 1.00    Pack years: 0.25    Quit date: 05/03/2005    Years since quitting: 14.9  . Smokeless tobacco: Never Used  Vaping Use  . Vaping Use: Never used  Substance Use Topics  . Alcohol use: Not Currently    Alcohol/week: 0.0 standard drinks  . Drug use: Not Currently    Types: Cocaine    Comment: last used 3 to 4 months ago    No Known Allergies  Current Outpatient Medications  Medication Sig Dispense Refill  . docusate sodium (COLACE) 100 MG capsule Take 1  capsule (100 mg total) by mouth 2 (two) times daily. (Patient not taking: Reported on 04/26/2020) 60 capsule 2  . Drospirenone (SLYND) 4 MG TABS Take 1 tablet by mouth daily. (Patient not taking: Reported on 04/26/2020) 28 tablet 11  . labetalol (NORMODYNE) 200 MG tablet Take 1 tablet (200 mg total) by mouth 2 (two) times daily. 60 tablet 3  . lisinopril-hydrochlorothiazide (ZESTORETIC) 10-12.5 MG tablet Take 1 tablet by mouth every morning.    . magnesium hydroxide (MILK OF MAGNESIA) 400 MG/5ML suspension Take 30 mLs by mouth at bedtime as needed for mild constipation or moderate constipation. (Patient not taking: Reported on 04/26/2020) 355 mL 5  . NIFEdipine (PROCARDIA XL) 30 MG 24 hr tablet Take 1 tablet (30 mg total) by mouth daily. 30 tablet 11  . ondansetron (ZOFRAN) 4 MG tablet Take 1 tablet (4 mg total) by mouth daily as needed for nausea or vomiting. (Patient not taking: Reported on 01/31/2020) 30 tablet 1  . oxyCODONE (ROXICODONE) 5 MG immediate release tablet Take 1 tablet (5 mg total) by mouth every 4 (four) hours as needed for severe pain or breakthrough pain. (Patient not taking: Reported on 01/31/2020) 10 tablet 0  .  polyethylene glycol (MIRALAX / GLYCOLAX) 17 g packet Take 17 g by mouth daily as needed for moderate constipation. (Patient not taking: Reported on 01/31/2020)     No current facility-administered medications for this visit.    Review of Systems Review of Systems Constitutional: negative for fatigue and weight loss Respiratory: negative for cough and wheezing Cardiovascular: negative for chest pain, fatigue and palpitations Gastrointestinal: positive for lower abdominal pain, and negative for change in bowel habits Genitourinary:negative Integument/breast: negative for nipple discharge Musculoskeletal:negative for myalgias Neurological: negative for gait problems and tremors Behavioral/Psych: negative for abusive relationship, depression Endocrine: negative for  temperature intolerance      Blood pressure (!) 169/105, pulse 67, height 5\' 4"  (1.626 m), weight 161 lb (73 kg).  Physical Exam Physical Exam General:   alert and no acute distress  Skin:   no rash or abnormalities  Lungs:   clear to auscultation bilaterally  Heart:   regular rate and rhythm, S1, S2 normal, no murmur, click, rub or gallop  Breasts:   normal without suspicious masses, skin or nipple changes or axillary nodes  Abdomen:  normal findings: no organomegaly, soft, non-tender and no hernia  Pelvis:  External genitalia: normal general appearance Urinary system: urethral meatus normal and bladder without fullness, nontender Vaginal: normal without tenderness, induration or masses Cervix: normal appearance Adnexa: normal bimanual exam Uterus: anteverted and non-tender, normal size    50% of 20 min visit spent on counseling and coordination of care.   Data Reviewed Quantitative beta HCG  Assessment     1. HTN (hypertension), benign Rx: - labetalol (NORMODYNE) 200 MG tablet; Take 1 tablet (200 mg total) by mouth 2 (two) times daily.  Dispense: 60 tablet; Refill: 3 - NIFEdipine (PROCARDIA XL) 30 MG 24 hr tablet; Take 1 tablet (30 mg total) by mouth daily.  Dispense: 30 tablet; Refill: 11  2. Pregnancy at early stage - intrauterine early GS observed on ultrasound.  No yolk sac or fetal pole observed - repeat ultrasound in 1 week  3. Encounter to determine fetal viability of pregnancy, single or unspecified fetus Rx: - Beta hCG quant (ref lab) - OB Limited; Future  4. Unsure of LMP (last menstrual period) as reason for ultrasound scan Rx: - US OB Limited; Future    Plan  Repeat Quantitative beta-HCG in 2 days  Repeat ultrasound in 1 week  Ectopic precautions given   Orders Placed This Encounter  Procedures  . US OB Limited    Standing Status:   Future    Number of Occurrences:   1    Standing Expiration Date:   04/26/2021    Order Specific Question:    Reason for Exam (SYMPTOM  OR DIAGNOSIS REQUIRED)    Answer:   Dating and Viability    Order Specific Question:   Preferred Imaging Location?    Answer:   Internal  . Beta hCG quant (ref lab)   Meds ordered this encounter  Medications  . labetalol (NORMODYNE) 200 MG tablet    Sig: Take 1 tablet (200 mg total) by mouth 2 (two) times daily.    Dispense:  60 tablet    Refill:  3  . NIFEdipine (PROCARDIA XL) 30 MG 24 hr tablet    Sig: Take 1 tablet (30 mg total) by mouth daily.    Dispense:  30 tablet    Refill:  11     04/28/2021, MD 04/26/2020 10:52 AM

## 2020-04-26 NOTE — Progress Notes (Signed)
Pt presents for repeat Quant and BP meds  Pt c/o low abdominal pain, denies VB  Last Quant was 2,368 on 04/24/20  Pt has irregular periods; has unsure LMP

## 2020-04-27 LAB — BETA HCG QUANT (REF LAB): hCG Quant: 3828 m[IU]/mL

## 2020-04-28 ENCOUNTER — Other Ambulatory Visit: Payer: Self-pay

## 2020-04-28 ENCOUNTER — Ambulatory Visit: Payer: Self-pay

## 2020-04-28 VITALS — BP 149/99 | HR 73

## 2020-04-28 DIAGNOSIS — Z3490 Encounter for supervision of normal pregnancy, unspecified, unspecified trimester: Secondary | ICD-10-CM

## 2020-04-28 NOTE — Progress Notes (Signed)
Patient presents for B/P check and Repeat HCG per Dr.Harper. Pt states taking medication as directed denies any vaginal bleeding or pain.   HCG on 04/26/20 3,828 and on  04/24/20 2,368.  Consulted with Dr.Harper  Pt made aware of precautions and that she will be contacted regarding results.

## 2020-04-29 LAB — BETA HCG QUANT (REF LAB): hCG Quant: 7358 m[IU]/mL

## 2020-05-01 ENCOUNTER — Telehealth: Payer: Self-pay

## 2020-05-01 NOTE — Telephone Encounter (Signed)
Return call to pt regarding message pt call to F/U on HCG results from Friday and safe cold medication.  Pt made aware of safe Rx OTC  And advised I will let provider know to advise on results.  Pt voiced understanding.

## 2020-05-03 ENCOUNTER — Telehealth: Payer: Self-pay

## 2020-05-03 ENCOUNTER — Other Ambulatory Visit: Payer: Self-pay

## 2020-05-03 ENCOUNTER — Other Ambulatory Visit: Payer: Self-pay | Admitting: Obstetrics

## 2020-05-03 DIAGNOSIS — O3680X0 Pregnancy with inconclusive fetal viability, not applicable or unspecified: Secondary | ICD-10-CM

## 2020-05-03 NOTE — Telephone Encounter (Signed)
TC to pt after Provider advised on HCG results Per Dr.Harper pt needs dating U/S  Message sent to scheduling to schedule order has been placed.

## 2020-05-03 NOTE — Telephone Encounter (Signed)
Orders placed in the symptoms

## 2020-05-09 ENCOUNTER — Telehealth: Payer: Self-pay | Admitting: Medical

## 2020-05-09 ENCOUNTER — Ambulatory Visit
Admission: RE | Admit: 2020-05-09 | Discharge: 2020-05-09 | Disposition: A | Discharge status: Home / Self Care | Payer: Medicaid Other | Source: Ambulatory Visit | Attending: Obstetrics | Admitting: Obstetrics

## 2020-05-09 ENCOUNTER — Other Ambulatory Visit: Payer: Self-pay

## 2020-05-09 DIAGNOSIS — O3680X0 Pregnancy with inconclusive fetal viability, not applicable or unspecified: Secondary | ICD-10-CM | POA: Insufficient documentation

## 2020-05-09 NOTE — Telephone Encounter (Signed)
I called Andrea Burns today at 1:08 PM and confirmed patient's identity using two patient identifiers. Andrea Burns results from earlier today were reviewed. Patient is scheduled for new OB visit at [facility] on 05/10/20. First trimester warning signs reviewed. Patient voiced understanding and had no further questions.   Andrea Burns OB LESS THAN 14 WEEKS WITH OB TRANSVAGINAL  Result Date: 05/09/2020 CLINICAL DATA:  34 year old pregnant female presents for assessment of fetal dating and viability. Uncertain LMP. Quantitative beta HCG 7,358 on 04/28/2020. EXAM: OBSTETRIC <14 WK Andrea Burns AND TRANSVAGINAL OB Andrea Burns TECHNIQUE: Both transabdominal and transvaginal ultrasound examinations were performed for complete evaluation of the gestation as well as the maternal uterus, adnexal regions, and pelvic cul-de-sac. Transvaginal technique was performed to assess early pregnancy. COMPARISON:  04/26/2020 outside obstetric scan. FINDINGS: Intrauterine gestational sac: Single Yolk sac:  Visualized. Embryo:  Visualized. Cardiac Activity: Visualized. Heart Rate: 132 bpm CRL:  8.8 mm   6 w   6 d                  Andrea Burns EDC: 12/27/2020 Subchorionic hemorrhage:  None visualized. Maternal uterus/adnexae: Right ovary measures 6.3 x 3.8 x 4.6 cm and contains a simple 3.5 x 3.3 x 3.5 cm cyst. Left ovary measures 3.8 x 3.38 x 2.6 cm and contains subjacent simple 2.2 x 1.8 x 2.0 cm and 1.7 x 1.3 x 1.4 cm cysts. No suspicious ovarian or adnexal masses. No abnormal free fluid in the pelvis. IMPRESSION: 1. Single living intrauterine gestation at 6 weeks 6 days by crown-rump length. No acute first-trimester gestational abnormality. 2. Bilateral simple ovarian cysts, largest 3.5 cm on the right. No followup imaging recommended. Note: This recommendation does not apply to premenarchal patients or to those with increased risk (genetic, family history, elevated tumor markers or other high-risk factors) of ovarian cancer. Reference: Radiology 2019 Nov; 293(2):359-371.  Electronically Signed   By: Andrea Burns M.D.   On: 05/09/2020 10:13    Andrea Nipple, PA-C 05/09/2020 1:08 PM

## 2020-05-10 ENCOUNTER — Encounter: Payer: Self-pay | Admitting: Obstetrics

## 2020-05-10 ENCOUNTER — Ambulatory Visit (INDEPENDENT_AMBULATORY_CARE_PROVIDER_SITE_OTHER): Payer: Self-pay | Admitting: Obstetrics

## 2020-05-10 ENCOUNTER — Other Ambulatory Visit: Payer: Self-pay

## 2020-05-10 DIAGNOSIS — Z8669 Personal history of other diseases of the nervous system and sense organs: Secondary | ICD-10-CM

## 2020-05-10 DIAGNOSIS — Z3490 Encounter for supervision of normal pregnancy, unspecified, unspecified trimester: Secondary | ICD-10-CM

## 2020-05-10 DIAGNOSIS — O99350 Diseases of the nervous system complicating pregnancy, unspecified trimester: Secondary | ICD-10-CM

## 2020-05-10 DIAGNOSIS — G2581 Restless legs syndrome: Secondary | ICD-10-CM

## 2020-05-10 DIAGNOSIS — F32A Depression, unspecified: Secondary | ICD-10-CM

## 2020-05-10 DIAGNOSIS — F5104 Psychophysiologic insomnia: Secondary | ICD-10-CM

## 2020-05-10 DIAGNOSIS — F419 Anxiety disorder, unspecified: Secondary | ICD-10-CM

## 2020-05-10 DIAGNOSIS — J40 Bronchitis, not specified as acute or chronic: Secondary | ICD-10-CM

## 2020-05-10 DIAGNOSIS — Z419 Encounter for procedure for purposes other than remedying health state, unspecified: Secondary | ICD-10-CM | POA: Diagnosis not present

## 2020-05-10 DIAGNOSIS — O10919 Unspecified pre-existing hypertension complicating pregnancy, unspecified trimester: Secondary | ICD-10-CM

## 2020-05-10 MED ORDER — AZITHROMYCIN 250 MG PO TABS: ORAL_TABLET | ORAL | 0 refills | Status: DC

## 2020-05-10 MED ORDER — LABETALOL HCL 200 MG PO TABS: 400.0000 mg | ORAL_TABLET | Freq: Two times a day (BID) | ORAL | 5 refills | Status: DC

## 2020-05-10 NOTE — Progress Notes (Addendum)
Presents for restless leg pain 8/10 x 3 days, insomnia.  RA BP 157/92  70 LA  171/93  74

## 2020-05-10 NOTE — Progress Notes (Signed)
Patient ID: Andrea Burns, female   DOB: 1985-11-01, 34 y.o.   MRN: [id number]  Chief Complaint  Patient presents with  . Leg Pain    HPI  Andrea Burns is a 34 y.o. female.  Complains of difficulty getting to sleep at night because legs are very uncomfortable and feels sore and knotted up.  Has a history of anxiety / depression, and she feels that this is worsening because of the stressful situation at home. HPI  Past Medical History:  Diagnosis Date  . Anxiety   . Depression   . GERD (gastroesophageal reflux disease)   . HA (headache)   . 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 Medical history non-contributory   . Pneumonia    2013    Past Surgical History:  Procedure Laterality Date  . COLPOSCOPY W/ BIOPSY / CURETTAGE    . IUD REMOVAL    . LAPAROSCOPIC APPENDECTOMY N/A 10/18/2019   Procedure: APPENDECTOMY LAPAROSCOPIC;  Surgeon: [name], MD;  Location: [facility];  Service: General;  Laterality: N/A;  . LAPAROSCOPY ABDOMEN DIAGNOSTIC     Removal of migrated IUD   . NO PAST SURGERIES      Family History  Problem Relation Age of Onset  . Diabetes Mother   . Hypertension Mother   . Cancer Mother   . Diabetes Father   . Cancer Paternal Grandmother        liver & lung    Social History Social History   Tobacco Use  . Smoking status: Former Smoker    Packs/day: 0.25    Years: 1.00    Pack years: 0.25    Quit date: 05/03/2005    Years since quitting: 15.0  . Smokeless tobacco: Never Used  Vaping Use  . Vaping Use: Never used  Substance Use Topics  . Alcohol use: Not Currently    Alcohol/week: 0.0 standard drinks  . Drug use: Not Currently    Types: Cocaine    Comment: last used 3 to 4 months ago    No Known Allergies  Current Outpatient Medications  Medication Sig Dispense Refill  . labetalol (NORMODYNE) 200 MG tablet Take 2 tablets (400 mg total) by mouth 2 (two) times daily. 120 tablet 5  . NIFEdipine  (PROCARDIA XL) 30 MG 24 hr tablet Take 1 tablet (30 mg total) by mouth daily. 30 tablet 11  . azithromycin (ZITHROMAX Z-PAK) 250 MG tablet Take as directed, 500 mg po load, then 250 mg po daily. 6 tablet 0  . docusate sodium (COLACE) 100 MG capsule Take 1 capsule (100 mg total) by mouth 2 (two) times daily. (Patient not taking: Reported on 04/26/2020) 60 capsule 2  . Drospirenone (SLYND) 4 MG TABS Take 1 tablet by mouth daily. (Patient not taking: Reported on 04/26/2020) 28 tablet 11  . lisinopril-hydrochlorothiazide (ZESTORETIC) 10-12.5 MG tablet Take 1 tablet by mouth every morning. (Patient not taking: Reported on 05/10/2020)    . magnesium hydroxide (MILK OF MAGNESIA) 400 MG/5ML suspension Take 30 mLs by mouth at bedtime as needed for mild constipation or moderate constipation. (Patient not taking: Reported on 04/26/2020) 355 mL 5  . ondansetron (ZOFRAN) 4 MG tablet Take 1 tablet (4 mg total) by mouth daily as needed for nausea or vomiting. (Patient not taking: Reported on 01/31/2020) 30 tablet 1  . oxyCODONE (ROXICODONE) 5 MG immediate release tablet Take 1 tablet (5 mg total) by mouth every 4 (four) hours as needed for severe  pain or breakthrough pain. (Patient not taking: Reported on 01/31/2020) 10 tablet 0  . polyethylene glycol (MIRALAX / GLYCOLAX) 17 g packet Take 17 g by mouth daily as needed for moderate constipation. (Patient not taking: Reported on 01/31/2020)     No current facility-administered medications for this visit.    Review of Systems Review of Systems Constitutional: negative for fatigue and weight loss Respiratory:  positive for cough and sinus congestion Cardiovascular: negative for chest pain, fatigue and palpitations Gastrointestinal: negative for abdominal pain and change in bowel habits Genitourinary:negative Integument/breast: negative for nipple discharge Musculoskeletal:negative for myalgias Neurological: positive for migraines Behavioral/Psych: positive for  stressful domestic situation at home, and subsequent worsening anxiety and depression Endocrine: negative for temperature intolerance      There were no vitals taken for this visit.  Physical Exam Physical Exam General:   alert and no distress  Skin:   no rash or abnormalities  Lungs:   mild rales bilaterally that clear with cough  Heart:   regular rate and rhythm, S1, S2 normal, no murmur, click, rub or gallop  Breasts:   normal without suspicious masses, skin or nipple changes or axillary nodes  Abdomen:  normal findings: no organomegaly, soft, non-tender and no hernia    50% of 20 min visit spent on counseling and coordination of care.   Data Reviewed Labs Ultrasound: US OB LESS THAN 14 WEEKS WITH OB TRANSVAGINAL (Accession [id number]) (Order [id number]) Imaging Date: 05/09/2020 Department: [facility] Released By: Talbert Cage Authorizing: Brock Bad, MD  Exam Status  Status  Final [99]  PACS Intelerad Image Link  Show images for US OB LESS THAN 14 WEEKS WITH OB TRANSVAGINAL Study Result  Narrative & Impression  CLINICAL DATA:  34 year old pregnant female presents for assessment of fetal dating and viability. Uncertain LMP. Quantitative beta HCG 7,358 on 04/28/2020.  EXAM: OBSTETRIC <14 WK [place] AND TRANSVAGINAL OB US  TECHNIQUE: Both transabdominal and transvaginal ultrasound examinations were performed for complete evaluation of the gestation as well as the maternal uterus, adnexal regions, and pelvic cul-de-sac. Transvaginal technique was performed to assess early pregnancy.  COMPARISON:  04/26/2020 outside obstetric scan.  FINDINGS: Intrauterine gestational sac: Single  Yolk sac:  Visualized.  Embryo:  Visualized.  Cardiac Activity: Visualized.  Heart Rate: 132 bpm  CRL:  8.8 mm   6 w   6 d                  [place] EDC: 12/27/2020  Subchorionic hemorrhage:  None visualized.  Maternal uterus/adnexae:  Right ovary measures 6.3 x 3.8 x 4.6 cm and contains a simple 3.5 x 3.3 x 3.5 cm cyst. Left ovary measures 3.8 x 3.38 x 2.6 cm and contains subjacent simple 2.2 x 1.8 x 2.0 cm and 1.7 x 1.3 x 1.4 cm cysts. No suspicious ovarian or adnexal masses. No abnormal free fluid in the pelvis.  IMPRESSION: 1. Single living intrauterine gestation at 6 weeks 6 days by crown-rump length. No acute first-trimester gestational abnormality. 2. Bilateral simple ovarian cysts, largest 3.5 cm on the right. No followup imaging recommended. Note: This recommendation does not apply to premenarchal patients or to those with increased risk (genetic, family history, elevated tumor markers or other high-risk factors) of ovarian cancer. Reference: Radiology 2019 Nov; 293(2):359-371.   Electronically Signed   By: Delbert Phenix M.D.   On: 05/09/2020 10:13     Assessment    1. Pregnancy at early stage.  7 weeks  by ultrasound. - will schedule NOB appointment  2. Chronic hypertension during pregnancy, antepartum, uncontrolled Rx: - labetalol (NORMODYNE) 200 MG tablet; Take 2 tablets (400 mg total) by mouth 2 (two) times daily.  Dispense: 120 tablet; Refill: 5  3. Anxiety and depression Rx: - Ambulatory referral to Integrated Behavioral Health  4. Bronchitis Rx: - azithromycin (ZITHROMAX Z-PAK) 250 MG tablet; Take as directed, 500 mg po load, then 250 mg po daily.  Dispense: 6 tablet; Refill: 0 - increase fluids, comfort measures such as Robitussin, Tylenol, etc  5. Psychophysiological insomnia - very stressful home situation and she feels that she is very uncomfortable at night with Restless Leg Syndrome which makes it difficult to sleep  6. Migraines - referred to Neurology - Tylenol prn for now    Plan   Follow up in 4 weeks for NOB appointment  Orders Placed This Encounter  Procedures  . Ambulatory referral to Neurology    Referral Priority:   Routine    Referral Type:   Consultation     Referral Reason:   Specialty Services Required    Requested Specialty:   Neurology    Number of Visits Requested:   1  . Ambulatory referral to Integrated Behavioral Health    Referral Priority:   Routine    Referral Type:   Consultation    Referral Reason:   Specialty Services Required    Number of Visits Requested:   1   Meds ordered this encounter  Medications  . labetalol (NORMODYNE) 200 MG tablet    Sig: Take 2 tablets (400 mg total) by mouth 2 (two) times daily.    Dispense:  120 tablet    Refill:  5  . azithromycin (ZITHROMAX Z-PAK) 250 MG tablet    Sig: Take as directed, 500 mg po load, then 250 mg po daily.    Dispense:  6 tablet    Refill:  0     Brock Bad, MD 05/10/2020 12:41 PM

## 2020-05-17 ENCOUNTER — Other Ambulatory Visit: Payer: Self-pay

## 2020-05-17 ENCOUNTER — Inpatient Hospital Stay (HOSPITAL_COMMUNITY): Payer: Medicaid Other

## 2020-05-17 ENCOUNTER — Inpatient Hospital Stay (HOSPITAL_COMMUNITY)
Admission: AD | Admit: 2020-05-17 | Discharge: 2020-05-17 | Disposition: A | Discharge status: Home / Self Care | Payer: Medicaid Other | Attending: Obstetrics and Gynecology | Admitting: Obstetrics and Gynecology

## 2020-05-17 ENCOUNTER — Encounter (HOSPITAL_COMMUNITY): Payer: Self-pay | Admitting: Obstetrics and Gynecology

## 2020-05-17 ENCOUNTER — Telehealth: Payer: Self-pay | Admitting: *Deleted

## 2020-05-17 DIAGNOSIS — Z3491 Encounter for supervision of normal pregnancy, unspecified, first trimester: Secondary | ICD-10-CM

## 2020-05-17 DIAGNOSIS — O208 Other hemorrhage in early pregnancy: Secondary | ICD-10-CM | POA: Diagnosis not present

## 2020-05-17 DIAGNOSIS — O139 Gestational [pregnancy-induced] hypertension without significant proteinuria, unspecified trimester: Secondary | ICD-10-CM | POA: Diagnosis not present

## 2020-05-17 DIAGNOSIS — O209 Hemorrhage in early pregnancy, unspecified: Secondary | ICD-10-CM | POA: Diagnosis not present

## 2020-05-17 DIAGNOSIS — O10911 Unspecified pre-existing hypertension complicating pregnancy, first trimester: Secondary | ICD-10-CM | POA: Diagnosis not present

## 2020-05-17 DIAGNOSIS — O10919 Unspecified pre-existing hypertension complicating pregnancy, unspecified trimester: Secondary | ICD-10-CM | POA: Diagnosis present

## 2020-05-17 DIAGNOSIS — Z79899 Other long term (current) drug therapy: Secondary | ICD-10-CM | POA: Diagnosis not present

## 2020-05-17 DIAGNOSIS — Z3A08 8 weeks gestation of pregnancy: Secondary | ICD-10-CM | POA: Insufficient documentation

## 2020-05-17 DIAGNOSIS — Z87891 Personal history of nicotine dependence: Secondary | ICD-10-CM | POA: Insufficient documentation

## 2020-05-17 LAB — CBC
HCT: 36.1 % (ref 36.0–46.0)
Hemoglobin: 12.1 g/dL (ref 12.0–15.0)
MCH: 29.2 pg (ref 26.0–34.0)
MCHC: 33.5 g/dL (ref 30.0–36.0)
MCV: 87.2 fL (ref 80.0–100.0)
Platelets: 254 10*3/uL (ref 150–400)
RBC: 4.14 MIL/uL (ref 3.87–5.11)
RDW: 12.2 % (ref 11.5–15.5)
WBC: 10.5 10*3/uL (ref 4.0–10.5)
nRBC: 0 % (ref 0.0–0.2)

## 2020-05-17 LAB — URINALYSIS, ROUTINE W REFLEX MICROSCOPIC
Bacteria, UA: NONE SEEN
Glucose, UA: NEGATIVE mg/dL
Ketones, ur: NEGATIVE mg/dL
Nitrite: NEGATIVE
Protein, ur: 100 mg/dL — AB
Specific Gravity, Urine: 1.03 (ref 1.005–1.030)
pH: 7 (ref 5.0–8.0)

## 2020-05-17 LAB — WET PREP, GENITAL
Clue Cells Wet Prep HPF POC: NONE SEEN
Sperm: NONE SEEN
Trich, Wet Prep: NONE SEEN
Yeast Wet Prep HPF POC: NONE SEEN

## 2020-05-17 LAB — HCG, QUANTITATIVE, PREGNANCY: hCG, Beta Chain, Quant, S: 126033 m[IU]/mL — ABNORMAL HIGH (ref <5)

## 2020-05-17 MED ORDER — LABETALOL HCL 300 MG PO TABS: 600.0000 mg | ORAL_TABLET | Freq: Two times a day (BID) | ORAL | 5 refills | Status: DC

## 2020-05-17 NOTE — Telephone Encounter (Signed)
Spoke with pt today regarding email about BP. Pt states she is taking 800mg  Labetalol daily. Pt states BP reading today is 173/103.  Pt states she does have high levels of stress but managing.  Pt has become a little more active, trying to exercise.   Pt also states that she has noticed some spotting when she wipes with mild cramping.   Reviewed with Dr this morning, he recommends pt be seen and evaluated at [facility] for increase BP and spotting.  Pt made aware of where to go and be seen.

## 2020-05-17 NOTE — MAU Provider Note (Addendum)
History     CSN: [id number]  Arrival date and time: 05/17/20 0865   First Provider Initiated Contact with Patient 05/17/20 [phone]      Chief Complaint  Patient presents with  . Hypertension   Andrea Burns is a 34 yo [id number] who presents today for high blood pressure and spotting. Patient contacted her OBGYN yesterday who recommended she be seen for further evaluation today. Patient was taking Labetalol 200 mg BID Labetalol, and her provider switched her to 400 mg BID about 1 week ago. Patient reports no improvement in BP. Her BP was highest at home at 181/77 and the lowest ranges were 160/97 today in the MAU. Patient has a headache today which she states is worse when her BP is up. She takes Tylenol, but it does not help usually. Patient also has had some spotting. First episode was last week which was reported as a "thumb print size" and was light pink in color. Patient had spotting again this morning and it was a streak of light brown color. Patient feels lower abdominal cramping as well. Patient also feels very anxious and reports shortness of breath at nighttime. Her father just had a stroke. Patient is scheduled for a virtual provider with a psychologist on the 13th of this month. Patient also notes feeling chills at night and leaking urine when she coughs and a sharp lower abdominal pain with coughing. Patient denies changes in urination, nausea, vision changes, chest pain and edema.  No prenatal vitamins started yet.     OB History    Gravida  6   Para  4   Term  4   Preterm      AB  1   Living  4     SAB  1   TAB      Ectopic      Multiple      Live Births  4           Past Medical History:  Diagnosis Date  . Anxiety   . Depression   . GERD (gastroesophageal reflux disease)   . HA (headache)   . 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 Medical history non-contributory   . Pneumonia    2013    Past Surgical History:   Procedure Laterality Date  . COLPOSCOPY W/ BIOPSY / CURETTAGE    . IUD REMOVAL    . LAPAROSCOPIC APPENDECTOMY N/A 10/18/2019   Procedure: APPENDECTOMY LAPAROSCOPIC;  Surgeon: [name], MD;  Location: [facility];  Service: General;  Laterality: N/A;  . LAPAROSCOPY ABDOMEN DIAGNOSTIC     Removal of migrated IUD   . NO PAST SURGERIES      Family History  Problem Relation Age of Onset  . Diabetes Mother   . Hypertension Mother   . Cancer Mother   . Diabetes Father   . Cancer Paternal Grandmother        liver & lung    Social History   Tobacco Use  . Smoking status: Former Smoker    Packs/day: 0.25    Years: 1.00    Pack years: 0.25    Quit date: 05/03/2005    Years since quitting: 15.0  . Smokeless tobacco: Never Used  Vaping Use  . Vaping Use: Never used  Substance Use Topics  . Alcohol use: Not Currently    Alcohol/week: 0.0 standard drinks  . Drug use: Not Currently    Types: Cocaine    Comment:  last used 3 to 4 months ago    Allergies: No Known Allergies  Medications Prior to Admission  Medication Sig Dispense Refill Last Dose  . labetalol (NORMODYNE) 200 MG tablet Take 2 tablets (400 mg total) by mouth 2 (two) times daily. 120 tablet 5 05/17/2020 at 0730  . azithromycin (ZITHROMAX Z-PAK) 250 MG tablet Take as directed, 500 mg po load, then 250 mg po daily. 6 tablet 0   . docusate sodium (COLACE) 100 MG capsule Take 1 capsule (100 mg total) by mouth 2 (two) times daily. (Patient not taking: Reported on 04/26/2020) 60 capsule 2   . Drospirenone (SLYND) 4 MG TABS Take 1 tablet by mouth daily. (Patient not taking: Reported on 04/26/2020) 28 tablet 11   . lisinopril-hydrochlorothiazide (ZESTORETIC) 10-12.5 MG tablet Take 1 tablet by mouth every morning. (Patient not taking: Reported on 05/10/2020)     . magnesium hydroxide (MILK OF MAGNESIA) 400 MG/5ML suspension Take 30 mLs by mouth at bedtime as needed for mild constipation or moderate constipation. (Patient not  taking: Reported on 04/26/2020) 355 mL 5   . NIFEdipine (PROCARDIA XL) 30 MG 24 hr tablet Take 1 tablet (30 mg total) by mouth daily. 30 tablet 11   . ondansetron (ZOFRAN) 4 MG tablet Take 1 tablet (4 mg total) by mouth daily as needed for nausea or vomiting. (Patient not taking: Reported on 01/31/2020) 30 tablet 1   . oxyCODONE (ROXICODONE) 5 MG immediate release tablet Take 1 tablet (5 mg total) by mouth every 4 (four) hours as needed for severe pain or breakthrough pain. (Patient not taking: Reported on 01/31/2020) 10 tablet 0   . polyethylene glycol (MIRALAX / GLYCOLAX) 17 g packet Take 17 g by mouth daily as needed for moderate constipation. (Patient not taking: Reported on 01/31/2020)      Patient has not started taking prenatal vitamins yet.   Review of Systems  Constitutional: Positive for chills. Negative for fatigue and fever.  HENT: Negative for congestion.   Eyes: Negative for visual disturbance.  Respiratory: Positive for cough and shortness of breath. Negative for chest tightness.   Cardiovascular: Negative for chest pain, palpitations and leg swelling.  Gastrointestinal: Positive for abdominal pain. Negative for nausea and vomiting.  Genitourinary: Positive for vaginal bleeding. Negative for difficulty urinating, dysuria, flank pain and hematuria.  Musculoskeletal: Positive for back pain.  Neurological: Positive for headaches.  Psychiatric/Behavioral: The patient is nervous/anxious.    Physical Exam   Blood pressure (!) 157/83, pulse 67, temperature 98.3 F (36.8 C), resp. rate 17, weight 87.2 kg.  Physical Exam Constitutional:      General: She is not in acute distress.    Appearance: She is obese.  Cardiovascular:     Rate and Rhythm: Normal rate.     Pulses: Normal pulses.  Pulmonary:     Effort: Pulmonary effort is normal.  Abdominal:     General: There is no distension.     Palpations: Abdomen is soft. There is no mass.     Tenderness: There is abdominal  tenderness. There is no right CVA tenderness, left CVA tenderness, guarding or rebound.     Hernia: No hernia is present.  Genitourinary:    Vagina: Bleeding present. No vaginal discharge or erythema.     Cervix: Cervical bleeding present. No friability.  Musculoskeletal:        General: Tenderness present. No swelling.  Skin:    General: Skin is warm and dry.     Coloration: Skin is  not jaundiced or pale.     Findings: No erythema.  Neurological:     General: No focal deficit present.     Mental Status: She is alert and oriented to person, place, and time.  Psychiatric:        Mood and Affect: Mood normal.        Behavior: Behavior normal.     MAU Course  Procedures Patient Vitals for the past 24 hrs:  BP Temp Pulse Resp Weight  05/17/20 1101 (!) 157/83 -- 67 -- --  05/17/20 1046 (!) 154/77 -- 70 -- --  05/17/20 1031 (!) 157/80 -- 66 -- --  05/17/20 1016 (!) 152/82 -- 71 -- --  05/17/20 1001 (!) 160/93 -- 70 -- --  05/17/20 0957 (!) 162/84 -- 68 -- --  05/17/20 0936 (!) 160/97 -- 68 -- --  05/17/20 0923 (!) 162/99 98.3 F (36.8 C) 71 17 87.2 kg  No orders of the defined types were placed in this encounter.  Orders Placed This Encounter  Procedures  . Wet prep, genital    Standing Status:   Standing    Number of Occurrences:   1  . US OB Comp Less 14 Wks    Standing Status:   Standing    Number of Occurrences:   1    Order Specific Question:   Symptom/Reason for Exam    Answer:   Vaginal bleeding in pregnancy, first trimester [[id number]]  . Urinalysis, Routine w reflex microscopic Urine, Clean Catch    Standing Status:   Standing    Number of Occurrences:   1  . CBC    Standing Status:   Standing    Number of Occurrences:   1  . hCG, quantitative, pregnancy    Standing Status:   Standing    Number of Occurrences:   1   US OB Comp Less 14 Wks  Result Date: 05/17/2020 CLINICAL DATA:  Spotting EXAM: OBSTETRIC <14 WK ULTRASOUND TECHNIQUE: Transabdominal ultrasound  was performed for evaluation of the gestation as well as the maternal uterus and adnexal regions. COMPARISON:  None. FINDINGS: Intrauterine gestational sac: Single Yolk sac:  Visualized. Embryo:  Visualized. Cardiac Activity: Visualized. Heart Rate: 166 bpm MSD:    mm    w     d CRL:   16.7 mm   8 w 0 d                  [place] EDC: 12/27/2020 Subchorionic hemorrhage:  Small subchorionic hemorrhage. Maternal uterus/adnexae: 3.4 cm simple appearing right ovarian cyst. No adnexal mass or free fluid. IMPRESSION: Eight week intrauterine pregnancy. Fetal heart rate 166 beats per minute. Small subchorionic hemorrhage. Electronically Signed   By: Charlett Nose M.D.   On: 05/17/2020 11:28    MDM Adiyah Fendley is a 34 yo [id number] who presents today for high blood pressure and spotting. Patient was recently switched from Labetalol 200 mg BID Labetalol to 400 mg BID about 1 week ago with no improvement in BP. Patient also reports a headache. Possible causes of bleeding include miscarriage and ectopic pregnancy. Plan is to consult for management of patient's BP and do routine workup for first trimester bleeding.   Assessment and Plan  1.) Intrauterine Pregnancy - TVUS performed and showed presence of a gestational sac and yolk sac. Embryo and cardiac activity visualized as well. Patient informed of [place] results and had all questions answered. Patient informed to establish prenatal care with her OBGYN. Patient also  informed about the small subchorionic hemorrhage and educated to monitor her bleeding; informed no follow up is necessary, but to notify provider if bleeding is increased to the point of soaking pads. Patient had no further questions.  2.) Chronic gestational hypertension - Current hypertension regimen of 400 mg of Labetalol BID changed to 600 mg of Labetalol BID. Patient to follow up with OBGYN this Friday for a BP check and make further changes to BP regimen if necessary.  Patient ready for discharge.    Colman CaterSarah K Panone PA-S 05/17/2020, 1:02 PM    CNM attestation:  I have seen and examined this patient; I agree with above documentation in the PA student's note.   Shelly RubensteinLatosha A Hentz is a 34 y.o. [id number] reporting vaginal bleeding and HTN. Denies urinary symptoms, vaginal itching/burning.  PE: BP (!) 155/96   Pulse 76   Temp 98.3 F (36.8 C)   Resp 19   Wt 87.2 kg   LMP  (LMP Unknown)   BMI 33.00 kg/m  Gen: calm comfortable, NAD Resp: normal effort, no distress Abd: soft, nontender  ROS, labs, PMH reviewed   Plan: D/C home Teaching done about vaginal bleeding in pregnancy/subchorionic hemorrhage and CHTN  Consult Dr Jolayne Pantheronstant with assessment and findings, increase labetalol to 600 BID Follow up[facility] drawn Return to MAU as needed for emergencies   Sharen CounterLisa Leftwich-Kirby, CNM 5:14 PM

## 2020-05-17 NOTE — MAU Note (Signed)
Sent from the office for hypertension & spotting. States she had an episode of spotting last week and then once today.  Reports BP 173/103 and that she has a HA.  Took her labetalol at 0730.

## 2020-05-18 LAB — GC/CHLAMYDIA PROBE AMP (~~LOC~~) NOT AT ARMC
Chlamydia: NEGATIVE
Comment: NEGATIVE
Comment: NORMAL
Neisseria Gonorrhea: NEGATIVE

## 2020-05-19 ENCOUNTER — Ambulatory Visit (INDEPENDENT_AMBULATORY_CARE_PROVIDER_SITE_OTHER): Payer: Medicaid Other

## 2020-05-19 ENCOUNTER — Other Ambulatory Visit: Payer: Self-pay

## 2020-05-19 ENCOUNTER — Other Ambulatory Visit: Payer: Medicaid Other

## 2020-05-19 VITALS — BP 150/89 | HR 65 | Wt 188.0 lb

## 2020-05-19 DIAGNOSIS — O10919 Unspecified pre-existing hypertension complicating pregnancy, unspecified trimester: Secondary | ICD-10-CM

## 2020-05-19 DIAGNOSIS — O0990 Supervision of high risk pregnancy, unspecified, unspecified trimester: Secondary | ICD-10-CM | POA: Insufficient documentation

## 2020-05-19 DIAGNOSIS — Z1329 Encounter for screening for other suspected endocrine disorder: Secondary | ICD-10-CM | POA: Diagnosis not present

## 2020-05-19 MED ORDER — PRENATAL 27-0.8 MG PO TABS
1.0000 | ORAL_TABLET | Freq: Every day | ORAL | 12 refills | Status: DC
Start: 2020-05-19 — End: 2020-09-04

## 2020-05-19 NOTE — Progress Notes (Signed)
PRENATAL INTAKE SUMMARY  Andrea Burns presents today New OB Nurse Interview.  OB History    Gravida  6   Para  4   Term  4   Preterm      AB  1   Living  4     SAB  1   IAB      Ectopic      Multiple      Live Births  4          I have reviewed the patient's medical, obstetrical, social, and family histories, medications, and available lab results.  SUBJECTIVE She has HTN  OBJECTIVE Initial Nurse Intake (New OB)  GENERAL APPEARANCE: alert, well appearing   ASSESSMENT HTN affecting Pregnancy PHQ-9=12  PLAN Prenatal care @ [facility] OB Pnl and baseline HTN labs done Ambulatary Referral to [facility] Babyscripts My Journey downloaded Early NOB appt 05/23/2020 due to HTN

## 2020-05-19 NOTE — Progress Notes (Signed)
Subjective:  Andrea Burns is a 34 y.o. female OB [redacted]w[redacted]d here for BP check.   Hypertension ROS: taking medications as instructed, no medication side effects noted, no TIA's, no chest pain on exertion, no dyspnea on exertion and no swelling of ankles.    Objective:  BP (!) 150/89 (BP Location: Left Arm, Cuff Size: Large)   Pulse 65   Wt 188 lb (85.3 kg)   LMP  (LMP Unknown)   BMI 32.27 kg/m   Appearance alert, well appearing, and in no distress. General exam BP noted to be well controlled today in office.    Assessment:   Blood Pressure poorly controlled.   Plan:  EARLY NOB APPT SCHEDULED 05/23/2020. Baseline & NOB Labs drawn  NOB Intake done

## 2020-05-20 LAB — CBC/D/PLT+RPR+RH+ABO+RUB AB...
Antibody Screen: NEGATIVE
Basophils Absolute: 0 10*3/uL (ref 0.0–0.2)
Basos: 0 %
EOS (ABSOLUTE): 0.1 10*3/uL (ref 0.0–0.4)
Eos: 1 %
HCV Ab: <0.1 s/co ratio (ref 0.0–0.9)
HIV Screen 4th Generation wRfx: NONREACTIVE
Hematocrit: 36.1 % (ref 34.0–46.6)
Hemoglobin: 12.3 g/dL (ref 11.1–15.9)
Hepatitis B Surface Ag: NEGATIVE
Immature Grans (Abs): 0 10*3/uL (ref 0.0–0.1)
Immature Granulocytes: 0 %
Lymphocytes Absolute: 2.2 10*3/uL (ref 0.7–3.1)
Lymphs: 20 %
MCH: 30.2 pg (ref 26.6–33.0)
MCHC: 34.1 g/dL (ref 31.5–35.7)
MCV: 89 fL (ref 79–97)
Monocytes Absolute: 0.6 10*3/uL (ref 0.1–0.9)
Monocytes: 5 %
Neutrophils Absolute: 8.2 10*3/uL — ABNORMAL HIGH (ref 1.4–7.0)
Neutrophils: 74 %
Platelets: 260 10*3/uL (ref 150–450)
RBC: 4.07 x10E6/uL (ref 3.77–5.28)
RDW: 11.9 % (ref 11.7–15.4)
RPR Ser Ql: NONREACTIVE
Rh Factor: POSITIVE
Rubella Antibodies, IGG: 1.49 index (ref >0.99)
WBC: 11.2 10*3/uL — ABNORMAL HIGH (ref 3.4–10.8)

## 2020-05-20 LAB — COMPREHENSIVE METABOLIC PANEL
ALT: 10 IU/L (ref 0–32)
AST: 13 IU/L (ref 0–40)
Albumin/Globulin Ratio: 1.7 (ref 1.2–2.2)
Albumin: 4.3 g/dL (ref 3.8–4.8)
Alkaline Phosphatase: 57 IU/L (ref 44–121)
BUN/Creatinine Ratio: 11 (ref 9–23)
BUN: 8 mg/dL (ref 6–20)
Bilirubin Total: 0.4 mg/dL (ref 0.0–1.2)
CO2: 20 mmol/L (ref 20–29)
Calcium: 9.8 mg/dL (ref 8.7–10.2)
Chloride: 101 mmol/L (ref 96–106)
Creatinine, Ser: 0.73 mg/dL (ref 0.57–1.00)
GFR calc Af Amer: 125 mL/min/{1.73_m2} (ref >59)
GFR calc non Af Amer: 109 mL/min/{1.73_m2} (ref >59)
Globulin, Total: 2.6 g/dL (ref 1.5–4.5)
Glucose: 83 mg/dL (ref 65–99)
Potassium: 4.1 mmol/L (ref 3.5–5.2)
Sodium: 134 mmol/L (ref 134–144)
Total Protein: 6.9 g/dL (ref 6.0–8.5)

## 2020-05-20 LAB — TSH: TSH: 1.52 u[IU]/mL (ref 0.450–4.500)

## 2020-05-20 LAB — HEMOGLOBIN A1C
Est. average glucose Bld gHb Est-mCnc: 94 mg/dL
Hgb A1c MFr Bld: 4.9 % (ref 4.8–5.6)

## 2020-05-20 LAB — HCV INTERPRETATION

## 2020-05-21 LAB — URINE CULTURE, OB REFLEX

## 2020-05-21 LAB — CULTURE, OB URINE

## 2020-05-22 ENCOUNTER — Ambulatory Visit (INDEPENDENT_AMBULATORY_CARE_PROVIDER_SITE_OTHER): Payer: Medicaid Other | Admitting: Licensed Clinical Social Worker

## 2020-05-22 DIAGNOSIS — Z3A08 8 weeks gestation of pregnancy: Secondary | ICD-10-CM | POA: Diagnosis not present

## 2020-05-22 DIAGNOSIS — F32A Depression, unspecified: Secondary | ICD-10-CM | POA: Diagnosis not present

## 2020-05-22 DIAGNOSIS — O99341 Other mental disorders complicating pregnancy, first trimester: Secondary | ICD-10-CM

## 2020-05-22 DIAGNOSIS — O99340 Other mental disorders complicating pregnancy, unspecified trimester: Secondary | ICD-10-CM

## 2020-05-22 NOTE — BH Specialist Note (Signed)
Integrated Behavioral Health via Telemedicine Visit  05/22/2020 Andrea Burns [id number]  Number of Integrated Behavioral Health visits: 1 Session Start time: 9:08am   Session End time: 9:47am Total time: 39 mins via mychart   Referring Provider: Aron Baba MD  Patient/Family location: Home  [facility] Provider location: [facility] Andrea Burns All persons participating in visit: Pt Andrea Burns and LCSWA A. Felton Clinton  Types of Service: General Behavioral Integrated Care (BHI)  I connected with Andrea Burns and/or Andrea Burns's n/a by Video enabled telemedicine application careagillity and verified that I am speaking with the correct person using two identifiers.    Discussed confidentiality: Yes   I discussed the limitations of telemedicine and the availability of in person appointments.  Discussed there is a possibility of technology failure and discussed alternative modes of communication if that failure occurs.  I discussed that engaging in this telemedicine visit, they consent to the provision of behavioral healthcare and the services will be billed under their insurance.  Patient and/or legal guardian expressed understanding and consented to Telemedicine visit: Yes   Presenting Concerns: Patient and/or family reports the following symptoms/concerns: Anxiety and depression  Duration of problem: approx 4 years  ; Severity of problem: moderate  Patient and/or Family's Strengths/Protective Factors: Concrete supports in place (healthy food, safe environments, etc.)  Goals Addressed: Patient will: 1.  Reduce symptoms of: depression  2.  Increase knowledge and/or ability of: healthy habits  3.  Demonstrate ability to: Increase healthy adjustment to current life circumstances  Progress towards Goals: Ongoing  Interventions: Interventions utilized:  CBT Cognitive Behavioral Therapy Standardized Assessments completed: PHQ 9 score 12   Patient and/or Family Response:  n/a  Assessment: Patient currently experiencing depression affecting pregnancy. Ms. Widger reports increased anxiety and depressed mood. Ms. Wee reports she stopped working a few weeks ago and symptoms have increased since then. Ms. Sassaman reports the mentioned symptoms has impacted her marriage and relationship with children. Ms. Osier reports increase irritability, mood swings and difficulty sleeping    Patient may benefit from integrated behavioral health   Plan: 1. Follow up with behavioral health clinician on : 3 weeks via mychart  2. Behavioral recommendations: Prioritize sleep, listen to music, engage in self care techniques, continue taking prenatal vitamins, date nights with spouse to build communication. Engage in mindfulness techniques to reduce stress.  3. Referral(s): n/a  I discussed the assessment and treatment plan with the patient and/or parent/guardian. They were provided an opportunity to ask questions and all were answered. They agreed with the plan and demonstrated an understanding of the instructions.   They were advised to call back or seek an in-person evaluation if the symptoms worsen or if the condition fails to improve as anticipated.  Andrea Saxon, LCSW

## 2020-05-22 NOTE — Progress Notes (Signed)
Agree with A & P. 

## 2020-05-23 ENCOUNTER — Other Ambulatory Visit: Payer: Self-pay

## 2020-05-23 ENCOUNTER — Ambulatory Visit (INDEPENDENT_AMBULATORY_CARE_PROVIDER_SITE_OTHER): Payer: Medicaid Other | Admitting: Obstetrics and Gynecology

## 2020-05-23 ENCOUNTER — Encounter: Payer: Self-pay | Admitting: Obstetrics and Gynecology

## 2020-05-23 VITALS — BP 159/93 | HR 78 | Wt 197.0 lb

## 2020-05-23 DIAGNOSIS — Z3481 Encounter for supervision of other normal pregnancy, first trimester: Secondary | ICD-10-CM

## 2020-05-23 DIAGNOSIS — O99340 Other mental disorders complicating pregnancy, unspecified trimester: Secondary | ICD-10-CM

## 2020-05-23 DIAGNOSIS — F32A Depression, unspecified: Secondary | ICD-10-CM

## 2020-05-23 DIAGNOSIS — O0990 Supervision of high risk pregnancy, unspecified, unspecified trimester: Secondary | ICD-10-CM

## 2020-05-23 DIAGNOSIS — F1911 Other psychoactive substance abuse, in remission: Secondary | ICD-10-CM | POA: Insufficient documentation

## 2020-05-23 DIAGNOSIS — Z3A08 8 weeks gestation of pregnancy: Secondary | ICD-10-CM

## 2020-05-23 DIAGNOSIS — O10919 Unspecified pre-existing hypertension complicating pregnancy, unspecified trimester: Secondary | ICD-10-CM | POA: Diagnosis not present

## 2020-05-23 LAB — BK QUANT PCR (URINE): BK Virus DNA, Urine: NEGATIVE copies/mL

## 2020-05-23 MED ORDER — NIFEDIPINE ER OSMOTIC RELEASE 30 MG PO TB24: 30.0000 mg | ORAL_TABLET | Freq: Every day | ORAL | 9 refills | Status: DC

## 2020-05-23 NOTE — Addendum Note (Signed)
Addended by: Marya Landry D on: 05/23/2020 04:48 PM   Modules accepted: Orders

## 2020-05-23 NOTE — Progress Notes (Addendum)
[redacted]w[redacted]d Early NOB per Dr. Alysia Penna needs to be seen because of HTN.  C/o elbows feeling like they are going to break.  Last PAP 01/31/2020

## 2020-05-23 NOTE — Progress Notes (Signed)
Subjective:  Andrea Burns is a [id number] [redacted]w[redacted]d being seen today for her first obstetrical visit.  Her obstetrical history is significant for chronic hypertension, recent history of substance abuse. Patient does intend to breast feed. Pregnancy history fully reviewed.  Patient reports no complaints.  BP (!) 159/93   Pulse 78   Wt 197 lb (89.4 kg)   LMP  (LMP Unknown)   BMI 33.81 kg/m   HISTORY: OB History  Gravida Para Term Preterm AB Living  6 4 4   1 4   SAB IAB Ectopic Multiple Live Births  1       4    # Outcome Date GA Lbr Len/2nd Weight Sex Delivery Anes PTL Lv  6 Current           5 Term 05/10/13 [redacted]w[redacted]d 35:30 / 00:36 6 lb 15.3 oz (3.155 kg) M Vag-Spont Local  LIV  4 Term 04/23/09 [redacted]w[redacted]d  8 lb (3.629 kg) M Vag-Spont None  LIV     Birth Comments: No complications  3 Term 02/28/07 [redacted]w[redacted]d  7 lb 5 oz (3.317 kg) M Vag-Spont None  LIV     Birth Comments: No complications  2 Term 05/16/04 [redacted]w[redacted]d  7 lb 11 oz (3.487 kg) F Vag-Spont None  LIV     Birth Comments: No complications  1 SAB             Past Medical History:  Diagnosis Date  . Anxiety   . Depression   . GERD (gastroesophageal reflux disease)   . HA (headache)   . Hypertension   . 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 Medical history non-contributory   . Pneumonia    2013    Past Surgical History:  Procedure Laterality Date  . COLPOSCOPY W/ BIOPSY / CURETTAGE    . IUD REMOVAL    . LAPAROSCOPIC APPENDECTOMY N/A 10/18/2019   Procedure: APPENDECTOMY LAPAROSCOPIC;  Surgeon: 12/18/2019, MD;  Location: [facility];  Service: General;  Laterality: N/A;  . LAPAROSCOPY ABDOMEN DIAGNOSTIC     Removal of migrated IUD   . NO PAST SURGERIES      Family History  Problem Relation Age of Onset  . Diabetes Mother   . Hypertension Mother   . Cancer Mother   . Diabetes Father   . Cancer Paternal Grandmother        liver & lung     Exam  BP (!) 159/93   Pulse 78   Wt 197 lb  (89.4 kg)   LMP  (LMP Unknown)   BMI 33.81 kg/m   Chaperone present during exam  CONSTITUTIONAL: Well-developed, well-nourished female in no acute distress.  HENT:  Normocephalic, atraumatic, External right and left ear normal. Oropharynx is clear and moist EYES: Conjunctivae and EOM are normal. Pupils are equal, round, and reactive to light. No scleral icterus.  NECK: Normal range of motion, supple, no masses.  Normal thyroid.  CARDIOVASCULAR: Normal heart rate noted, regular rhythm RESPIRATORY: Clear to auscultation bilaterally. Effort and breath sounds normal, no problems with respiration noted. BREASTS: Symmetric in size. No masses, skin changes, nipple drainage, or lymphadenopathy. ABDOMEN: Soft, normal bowel sounds, no distention noted.  No tenderness, rebound or guarding.  PELVIC: Normal appearing external genitalia; normal appearing vaginal mucosa and cervix. No abnormal discharge noted. Normal uterine size, no other palpable masses, no uterine or adnexal tenderness. MUSCULOSKELETAL: Normal range of motion. No tenderness.  No cyanosis, clubbing, or edema.  2+  distal pulses. SKIN: Skin is warm and dry. No rash noted. Not diaphoretic. No erythema. No pallor. NEUROLOGIC: Alert and oriented to person, place, and time. Normal reflexes, muscle tone coordination. No cranial nerve deficit noted. PSYCHIATRIC: Normal mood and affect. Normal behavior. Normal judgment and thought content.    Assessment:    Pregnancy: [id number] Patient Active Problem List   Diagnosis Date Noted  . History of substance abuse (HCC) 05/23/2020  . Supervision of high risk pregnancy, antepartum 05/19/2020  . Chronic hypertension affecting pregnancy 05/17/2020  . Acute appendicitis   . Depression affecting pregnancy 08/12/2013      Plan:   1. Supervision of high risk pregnancy, antepartum - Patient for genetic screening at next appointment. - Patient for anatomy ultrasound at 18 weeks.  2. Chronic  hypertension affecting pregnancy - Patient to start ASA today. - Ambulatory referral to Cardiology - NIFEdipine (PROCARDIA XL) 30 MG 24 hr tablet; Take 1 tablet (30 mg total) by mouth daily.  Dispense: 30 tablet; Refill: 9 - Patient to continue labetalol 600 mg bid. - Will check urine P:C ratio.  3. Depression affecting pregnancy - Patient to continue with behavorial health.  4. History of substance abuse (HCC) - History of cocaine abuse.  Last used about 3-4 months ago. - No history of other drugs or IV drug use. - Risk of cocaine use in pregnancy discussed. - Urine drugs of abuse scrn w alc, routine ([facility]) - Ambulatory referral to Cardiology     Problem list reviewed and updated. 50% of 40 min visit spent on counseling and coordination of care.     Darrin Nipper. [facility]ore, MD 05/23/2020

## 2020-05-24 LAB — PROTEIN / CREATININE RATIO, URINE
Creatinine, Urine: 137.8 mg/dL
Protein, Ur: 11.2 mg/dL
Protein/Creat Ratio: 81 mg/g creat (ref 0–200)

## 2020-05-25 ENCOUNTER — Ambulatory Visit (INDEPENDENT_AMBULATORY_CARE_PROVIDER_SITE_OTHER): Payer: Medicaid Other | Admitting: Cardiology

## 2020-05-25 ENCOUNTER — Other Ambulatory Visit: Payer: Self-pay

## 2020-05-25 ENCOUNTER — Encounter: Payer: Self-pay | Admitting: Cardiology

## 2020-05-25 VITALS — BP 130/90 | HR 80 | Ht 64.0 in | Wt 193.0 lb

## 2020-05-25 DIAGNOSIS — R079 Chest pain, unspecified: Secondary | ICD-10-CM

## 2020-05-25 DIAGNOSIS — O10919 Unspecified pre-existing hypertension complicating pregnancy, unspecified trimester: Secondary | ICD-10-CM | POA: Diagnosis not present

## 2020-05-25 LAB — URINE DRUGS OF ABUSE SCREEN W ALC, ROUTINE (REF LAB)
Amphetamines, Urine: NEGATIVE ng/mL
Barbiturate Quant, Ur: NEGATIVE ng/mL
Benzodiazepine Quant, Ur: NEGATIVE ng/mL
Cannabinoid Quant, Ur: NEGATIVE ng/mL
Cocaine (Metab.): NEGATIVE ng/mL
Ethanol, Urine: NEGATIVE %
Methadone Screen, Urine: NEGATIVE ng/mL
Opiate Quant, Ur: NEGATIVE ng/mL
PCP Quant, Ur: NEGATIVE ng/mL
Propoxyphene: NEGATIVE ng/mL

## 2020-05-25 MED ORDER — NIFEDIPINE ER OSMOTIC RELEASE 60 MG PO TB24: 60.0000 mg | ORAL_TABLET | Freq: Every day | ORAL | 3 refills | Status: DC

## 2020-05-25 NOTE — Progress Notes (Signed)
Cardiology Office Note:    Date:  05/25/2020   ID:  [name], DOB 08-12-1985, MRN [id number]  PCP:  Aviva Kluver  [facility] Cardiologist:  Donato Schultz, MD  [facility] Electrophysiologist:  None   Referring MD: Johnny Bridge, MD     History of Present Illness:    Andrea Burns is a 34 y.o. female here for hypertension during pregnancy. [redacted] weeks pregnant.   148/84 last night at home. Very high prior.   Stabbing CP at times. Sudden pressure. No syncope.   Stopped cocaine in May with appy.   Past Medical History:  Diagnosis Date  . Anxiety   . Depression   . GERD (gastroesophageal reflux disease)   . HA (headache)   . Hypertension   . 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 Medical history non-contributory   . Pneumonia    2013    Past Surgical History:  Procedure Laterality Date  . COLPOSCOPY W/ BIOPSY / CURETTAGE    . IUD REMOVAL    . LAPAROSCOPIC APPENDECTOMY N/A 10/18/2019   Procedure: APPENDECTOMY LAPAROSCOPIC;  Surgeon: [name], MD;  Location: [facility];  Service: General;  Laterality: N/A;  . LAPAROSCOPY ABDOMEN DIAGNOSTIC     Removal of migrated IUD   . NO PAST SURGERIES      Current Medications: Current Meds  Medication Sig  . labetalol (NORMODYNE) 300 MG tablet Take 2 tablets (600 mg total) by mouth 2 (two) times daily.  . Prenatal Vit-Fe Fumarate-FA (MULTIVITAMIN-PRENATAL) 27-0.8 MG TABS tablet Take 1 tablet by mouth daily at 12 noon.  . [DISCONTINUED] NIFEdipine (PROCARDIA XL) 30 MG 24 hr tablet Take 1 tablet (30 mg total) by mouth daily.     Allergies:   Patient has no known allergies.   Social History   Socioeconomic History  . Marital status: Single    Spouse name: Not on file  . Number of children: 4  . Years of education: Not on file  . Highest education level: Not on file  Occupational History  . Not on file  Tobacco Use  . Smoking status: Former Smoker    Packs/day: 0.25     Years: 1.00    Pack years: 0.25    Quit date: 05/03/2005    Years since quitting: 15.0  . Smokeless tobacco: Never Used  Vaping Use  . Vaping Use: Never used  Substance and Sexual Activity  . Alcohol use: Not Currently    Alcohol/week: 0.0 standard drinks  . Drug use: Not Currently    Types: Cocaine    Comment: last used 3 to 4 months ago  . Sexual activity: Yes    Partners: Male    Birth control/protection: None  Other Topics Concern  . Not on file  Social History Narrative  . Not on file   Social Determinants of Health   Financial Resource Strain: Not on file  Food Insecurity: Not on file  Transportation Needs: Not on file  Physical Activity: Not on file  Stress: Not on file  Social Connections: Not on file     Family History: The patient's family history includes Cancer in her mother and paternal grandmother; Diabetes in her father and mother; Hypertension in her mother.  ROS:   Please see the history of present illness.    No fevers chills nausea vomiting syncope bleeding all other systems reviewed and are negative.  EKGs/Labs/Other Studies Reviewed:    The following studies  were reviewed today: Prior ER visits reviewed. Lab work reviewed. Unremarkable.  EKG: Prior EKG in ER sinus rhythm no ischemic changes  Recent Labs: 05/19/2020: ALT 10; BUN 8; Creatinine, Ser 0.73; Hemoglobin 12.3; Platelets 260; Potassium 4.1; Sodium 134; TSH 1.520  Recent Lipid Panel No results found for: CHOL, TRIG, HDL, CHOLHDL, VLDL, LDLCALC, LDLDIRECT   Risk Assessment/Calculations:       Physical Exam:    VS:  BP 130/90 (BP Location: Left Arm, Patient Position: Sitting, Cuff Size: Normal)   Pulse 80   Ht 5\' 4"  (1.626 m)   Wt 193 lb (87.5 kg)   LMP  (LMP Unknown)   SpO2 98%   BMI 33.13 kg/m     Wt Readings from Last 3 Encounters:  05/25/20 193 lb (87.5 kg)  05/23/20 197 lb (89.4 kg)  05/19/20 188 lb (85.3 kg)     GEN:  Well nourished, well developed in no acute  distress HEENT: Normal NECK: No JVD; No carotid bruits LYMPHATICS: No lymphadenopathy CARDIAC: RRR, no murmurs, rubs, gallops RESPIRATORY:  Clear to auscultation without rales, wheezing or rhonchi  ABDOMEN: Soft, non-tender, non-distended MUSCULOSKELETAL:  No edema; No deformity  SKIN: Warm and dry NEUROLOGIC:  Alert and oriented x 3 PSYCHIATRIC:  Normal affect   ASSESSMENT:    1. Chronic hypertension affecting pregnancy   2. Chest pain of uncertain etiology    PLAN:    In order of problems listed above:  Essential hypertension in pregnancy -She has been hypertensive surrounding her pregnancies as well. -Has not used cocaine since May 2021. This was during her appendectomy. -We will go ahead and increase her nifedipine to 60 mg from 30 mg once a day. -For now continue with labetalol 600 mg twice a day. We have room to increase this as well in the future. Continue to use home blood pressure monitoring. -We will send to hypertension clinic, discussed with June 2021, pharmacist. They will closely follow her.  Atypical chest pain-prior ER visits -We will check echocardiogram to ensure proper structure and function of her heart. Prior chest pain episodes possibly anxiety/stress related/musculoskeletal.  Family history of stroke -Her father has had a stroke. Possibly hypertension related. Important for her to continue to monitor and treat her hypertension.  Prior cocaine use -Has stopped since May 2021.  Medication Adjustments/Labs and Tests Ordered: Current medicines are reviewed at length with the patient today.  Concerns regarding medicines are outlined above.  Orders Placed This Encounter  Procedures  . ECHOCARDIOGRAM COMPLETE   Meds ordered this encounter  Medications  . NIFEdipine (PROCARDIA XL/NIFEDICAL XL) 60 MG 24 hr tablet    Sig: Take 1 tablet (60 mg total) by mouth daily.    Dispense:  90 tablet    Refill:  3    Patient Instructions  Medication  Instructions:  Please increase your Nifedipine to 60 mg daily. Continue all other medications as listed.  *If you need a refill on your cardiac medications before your next appointment, please call your pharmacy*  Testing/Procedures: Your physician has requested that you have an echocardiogram. Echocardiography is a painless test that uses sound waves to create images of your heart. It provides your doctor with information about the size and shape of your heart and how well your heart's chambers and valves are working. This procedure takes approximately one hour. There are no restrictions for this procedure.  Please see the Hypertension Clinic in 2 to 4 weeks.  Follow-Up: At [facility], you and your health  needs are our priority.  As part of our continuing mission to provide you with exceptional heart care, we have created designated Provider Care Teams.  These Care Teams include your primary Cardiologist (physician) and Advanced Practice Providers (APPs -  Physician Assistants and Nurse Practitioners) who all work together to provide you with the care you need, when you need it.  We recommend signing up for the patient portal called "MyChart".  Sign up information is provided on this After Visit Summary.  MyChart is used to connect with patients for Virtual Visits (Telemedicine).  Patients are able to view lab/test results, encounter notes, upcoming appointments, etc.  Non-urgent messages can be sent to your provider as well.   To learn more about what you can do with MyChart, go to [email or web address].    Your next appointment:   3 month(s)  The format for your next appointment:   In Person  Provider:   Donato Schultz, MD   Thank you for choosing [facility]!!         Signed, Donato Schultz, MD  05/25/2020 9:40 AM    [facility]

## 2020-05-25 NOTE — Patient Instructions (Signed)
Medication Instructions:  Please increase your Nifedipine to 60 mg daily. Continue all other medications as listed.  *If you need a refill on your cardiac medications before your next appointment, please call your pharmacy*  Testing/Procedures: Your physician has requested that you have an echocardiogram. Echocardiography is a painless test that uses sound waves to create images of your heart. It provides your doctor with information about the size and shape of your heart and how well your heart's chambers and valves are working. This procedure takes approximately one hour. There are no restrictions for this procedure.  Please see the Hypertension Clinic in 2 to 4 weeks.  Follow-Up: At [facility], you and your health needs are our priority.  As part of our continuing mission to provide you with exceptional heart care, we have created designated Provider Care Teams.  These Care Teams include your primary Cardiologist (physician) and Advanced Practice Providers (APPs -  Physician Assistants and Nurse Practitioners) who all work together to provide you with the care you need, when you need it.  We recommend signing up for the patient portal called "MyChart".  Sign up information is provided on this After Visit Summary.  MyChart is used to connect with patients for Virtual Visits (Telemedicine).  Patients are able to view lab/test results, encounter notes, upcoming appointments, etc.  Non-urgent messages can be sent to your provider as well.   To learn more about what you can do with MyChart, go to [email or web address].    Your next appointment:   3 month(s)  The format for your next appointment:   In Person  Provider:   Donato Schultz, MD   Thank you for choosing [facility]!!

## 2020-06-01 NOTE — Addendum Note (Signed)
Addended by: Gwyndolyn Saxon on: 06/01/2020 09:46 AM   Modules accepted: Level of Service

## 2020-06-08 ENCOUNTER — Ambulatory Visit (INDEPENDENT_AMBULATORY_CARE_PROVIDER_SITE_OTHER): Payer: Medicaid Other | Admitting: Pharmacist

## 2020-06-08 ENCOUNTER — Encounter: Payer: Medicaid Other | Admitting: Obstetrics & Gynecology

## 2020-06-08 ENCOUNTER — Other Ambulatory Visit: Payer: Self-pay

## 2020-06-08 VITALS — BP 164/98 | HR 79

## 2020-06-08 DIAGNOSIS — O10919 Unspecified pre-existing hypertension complicating pregnancy, unspecified trimester: Secondary | ICD-10-CM

## 2020-06-08 NOTE — Progress Notes (Signed)
Patient ID: Andrea Burns                 DOB: 1985/06/25                      MRN: [id number]     HPI: Andrea Burns is a 34 y.o. female referred by Dr. Anne Fu to HTN clinic. PMH is significant for HTN, cocaine abuse, depression and acute appendicitis. Patient is about [redacted] weeks pregnant. She has been HTN surrounding her pregnancies. Nifedipine was started by Dr. Eugene Garnet on 12/14. Increased from 30mg  to 60mg  on 12/16 by Dr. . Urine negative for proteinuria on 12/14.   Patient presents today to HTN clinic. She did not bring her blood pressure log or reading with her, but states that her blood pressure is in the 180's-190/90-100 when she checks her blood pressure in her left arm and about 160/94 when she checks in her right. She reports only one episode of dizziness when she stood up. But reports that she has episodes where she feels weak and has to sit down for a few min. She does not feel dizzy, her legs just feel weak and shaky. Better after resting. Does not check her blood pressure when this happens. Also reports some swelling that improves after propping her feet. Asks about blood clots. States she gets pain in her calf that hurts, but feels better when he husband massages it. She has a lot of anxiety. Reports nausea right after taking her nifedipine. Having trouble sleeping. Has fatigue and restless leg syndrome that she did not follow up for.  After review of her medications, it appears patient misunderstood and was only taking labetalol 300mg  (1 tablet) twice a day. Has been taking nifedipine 30mg  twice a day, but is picking up the 60mg  tablets today.  She was eating a lot of fast food in the past. Not as much now. Has tried to cut back on soda. States she drinks one a day now.   Current HTN meds: labetalol 600mg  twice a day, nifedipine 60mg  daily Previously tried:  BP goal: 120-160/80-105 per ACOG recommendations  Family History:   Social History: no tobacco, no ETOH,  previous cocaine use (none now)  Diet: 1 soda (coke or Dr. Anne Fu) per day, no coffee/tea  Was Eating a lot of fast food less fast food now Eats a lot of candy Breakfast: egg, beans w/ sauce  Lunch:  Eats a lot of cheese: fresca cheese Pizza  Exercise: none  Home BP readings: 159/94, 180/100  Wt Readings from Last 3 Encounters:  05/25/20 193 lb (87.5 kg)  05/23/20 197 lb (89.4 kg)  05/19/20 188 lb (85.3 kg)   BP Readings from Last 3 Encounters:  05/25/20 130/90  05/23/20 (!) 159/93  05/19/20 (!) 150/89   Pulse Readings from Last 3 Encounters:  05/25/20 80  05/23/20 78  05/19/20 65    Renal function: Estimated Creatinine Clearance: 107.1 mL/min (by C-G formula based on SCr of 0.73 mg/dL).  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 120 tablet 5   NIFEdipine (PROCARDIA XL/NIFEDICAL XL) 60 MG 24 hr tablet Take 1 tablet (60 mg total) by mouth daily. 90 tablet 3   Prenatal Vit-Fe Fumarate-FA (MULTIVITAMIN-PRENATAL) 27-0.8 MG TABS tablet Take 1 tablet by mouth daily at 12 noon. 30 tablet 12   No current facility-administered medications on file prior to visit.    No Known Allergies  There were no vitals taken for this visit.   Assessment/Plan:  1. Hypertension - Blood pressure is above goal of 120-160/80-105 per ACOG recommendations. I would ideally like patient closer to the 130's. Will avoid lowering BP <120/80 due to the association with decreased uteroplacental perfusion. During second trimester a dose reduction may be needed due to  physiologic drop in BP.  Patient was not taking the correct labetalol dose, therefore will have patient start taking labetalol 600mg  twice a day. Continue Nifedipine CR 60mg  daily. We discussed paying attention to sodium intake, avoiding processed foods and reading food labels. Goal 2300mg  of sodium per day. Encouraged patient to start light exercise- walking for 5-10 min a day. This will help with her energy level and blood pressure. Follow up in office in 1.5 weeks-same day as her Echo. I advised her to check her blood pressure in her left arm. To record the reading and bring her log and blood pressure cuff to her next appointment.  Concerning her concern for blood clot and calf pain- I advised her that since pain improved with massage and it is not persistent it sound like pain is from muscle cramps and unlikely from a blood clot. Advised that if calf becomes swollen, red and persistently painful to contact her doctor. I will also send her concern to Dr. along with her concern about leg weakness.   Thank you  , Pharm.D, BCPS, CPP [facility]  [address], [place], [address] [place]  Phone: [phone]; Fax: [phone]

## 2020-06-08 NOTE — Patient Instructions (Addendum)
Start taking labetalol 300mg  tablets - 2 tablets in the AM and 2 tablets in the PM Continue taking Nifedipine 60mg  in the AM  Continue checking your blood pressure at home. Please record the readings and bring your log and BP cuff with you to your next appointment.  Call me at [phone] with any questions  Hypertension "High blood pressure"  Hypertension is often called "The Silent Killer." It rarely causes symptoms until it is extremely  high or has done damage to other organs in the body. For this reason, you should have your  blood pressure checked regularly by your physician. We will check your blood pressure  every time you see a provider at one of our offices.   Your blood pressure reading consists of two numbers. Ideally, blood pressure should be  below 120/80. The first ("top") number is called the systolic pressure. It measures the  pressure in your arteries as your heart beats. The second ("bottom") number is called the diastolic pressure. It measures the pressure in your arteries as the heart relaxes between beats.  The benefits of getting your blood pressure under control are enormous. A 10-point  reduction in systolic blood pressure can reduce your risk of stroke by 27% and heart failure by 28%  To check your pressure at home you will need to:  1. Sit up in a chair, with feet flat on the floor and back supported. Do not cross your ankles or legs. 2. Rest your left arm so that the cuff is about heart level. If the cuff goes on your upper arm,  then just relax the arm on the table, arm of the chair or your lap. If you have a wrist cuff, we  suggest relaxing your wrist against your chest (think of it as Pledging the Flag with the  wrong arm).  3. Place the cuff snugly around your arm, about 1 inch above the crook of your elbow. The  cords should be inside the groove of your elbow.  4. Sit quietly, with the cuff in place, for about 5 minutes. After that 5 minutes press  the power  button to start a reading. 5. Do not talk or move while the reading is taking place.  6. Record your readings on a sheet of paper. Although most cuffs have a memory, it is often  easier to see a pattern developing when the numbers are all in front of you.  7. You can repeat the reading after 1-3 minutes if it is recommended  Make sure your bladder is empty and you have not had caffeine or tobacco within the last 30 min  Always bring your blood pressure log with you to your appointments. If you have not brought your monitor in to be double checked for accuracy, please bring it to your next appointment.  You can find a list of validated (accurate) blood pressure cuffs at [email or web address].novalidatebp.org  Healthy Diet  SALT  What is the big deal with sodium? Why the need to limit our intake? What is the connection to  blood pressure? And what is the difference between salt and sodium? Sodium attracts water. Think about it. When you eat an overly salty snack, you tend to become  thirsty and need more water. If you have too much sodium in your bloodstream, your body will  then pull water into the bloodstream as well, trying to correct the imbalance. When you have  more volume in the bloodstream, your blood pressure goes up. Your  heart must work harder to  pump the extra volume, and the increase in pressure can wear out blood vessels faster. You may  also notice bloating and weight gain. Hypertension is one of the leading risk factors for heart  disease. By limiting sodium intake throughout life you are helping decrease your risk of heart  disease later on.   Salt is made up of two minerals. Sodium and chloride. A teaspoon of salt contains about 40%  sodium and 60% chloride. One teaspoon of salt has 2,300 mg of sodium. While the current  USDA guideline states you should consume no more than 2,300 mg per day, both they and the  American Heart Association recommend that you limit this to 1,500 mg to  stay healthy. Sea salt  or Himalayan pink salt may have a slightly different taste, but they still have almost the same  percent of sodium per teaspoon. So feel free to use them instead of table salt, but don't use  more.  A common myth is that if you don't add salt to your food, you are following a low sodium diet.  However, 75% of the sodium consumed in the American diet is from processed foods, NOT the  salt shaker. We all know that chips and crackers are high in sodium, but there are many other  foods that we may not think of when limiting our sodium. Below are the "salty six" foods that  the American Heart Association wants you to be aware of. 1. Cold cuts - even the healthy sliced [place] can have over 1,000 mg of sodium per slice.   Compare different brands to see which has less sodium if you eat these regularly 2. Pizza - depending on your toppings, a slice of pizza can have up to 760 mg of   sodium. Put more veggies on it or just have a slice with a side salad and still enjoy. 3. Soup - yes even that old home remedy of chicken soup is loaded with sodium. Look   for low sodium versions. Or add a bunch of frozen veggies when heating it, this will   give you less sodium per serving 4. Breads - they may not taste salty, but a single slice of bread can have up to 230 mg of   sodium. Toast for breakfast, a sandwich at lunch and a dinner roll can quickly add up   to over 900 mg in just one day.  5. Chicken - some fresh or frozen chicken is injected with a sodium solution before it   reaches the store. A 4 oz serving should have no more than 100 mg sodium. And   watch for breaded frozen chicken nuggets, strips and tenders. They may seem like a   quick and easy "healthy" meal, but they have high amounts of sodium as well. 6. Burritos/tacos - just 2 teaspoons of taco seasoning can have over 400 mg sodium.   Try making your own with equal parts of cumin, oregano, chili powder and garlic powder.    SUGAR  Sugar is a huge problem in the modern day diet. Sugar is a HUGE contributor to heart disease, diabetes, high triglyceride levels, fatty liver diease and obesity. Sugar is hidden in almost all packaged foods/beverages. It adds no nutritional benefit to your body and can cause major harm. Added sugar is extra sugar that is added beyond what is naturally found. The American Heart Association recommends limiting added sugars to no more than 25g  for women and 36 grams for men per day.  There are many names for sugar maltose, sucrose (names ending in "ose"), high fructose corn syrup, molasses, cane sugar, corn sweetener, raw sugar, syrup, honey or fruit juice concentrate.   One of the best ways to limit your added sugars is to stop drinking sweetened beverages such as soda, sweet tea, fruit juice or fancy coffee's. There is 65g of added sugars in one 20oz bottle of Coke!! That is equal to 6 donuts.   Pay attention and read all nutrition facts labels. Below is an examples of a nutrition facts label. The #1 is showing you the total sugars where the # 2 is showing you the added sugars. This one severing has almost the max amount of added sugars per day!  Watch out for items that say "low fat" or "no added sugar" as these products are typically very high in sugar. The food industry uses these terms to fool you into thinking they are healthy.  For more information on the dangers of sugar watch WHY Sugar is as Bad as Alcohol (Fructose, The Liver Toxin) on YouTube.    EXERCISE  Exercise can help lower your blood pressure ~5 points systolic (top #) and 8 points diastolic (bottom #)  Exercise is good. We've all heard that. In an ideal world, we would all have time and resources to  get plenty of it. When you are active your heart pumps more efficiently and you will feel better.  Multiple studies show that even walking regularly has benefits that include living a longer life.  The American Heart  Association recommends 90-150 minutes per week of exercise (30 minutes  per day most days of the week). You can do this in any increment you wish. Nine or more  10-minute walks count. So does an hour-long exercise class. Break the time apart into what will  work in your life. Some of the best things you can do include walking briskly, jogging, cycling or  swimming laps. Not everyone is ready to "exercise." Sometimes we need to start with just getting active. Here  are some easy ways to be more active throughout the day: Marland Kitchen Take the stairs instead of the elevator . Go for a 10-15 minute walk during your lunch break (find a friend to make it more enjoyable) . When shopping, park at the back of the parking lot . If you take public transportation, get off one stop early and walk the extra distance . Pace around while making phone calls (most of [place] are not attached to phone cords any longer!) Check with your doctor if you aren't sure what your limitations may be. Always remember to drink plenty of water when doing any type of exercise. Don't feel like a failure if you're not getting the 90-150 minutes per week. If you started by being  a couch potato, then just a 10-minute walk each day is a huge improvement. Start with little  victories and work your way up.   Healthy Eating Tips  When looking to improve your eating habits, whether to lose weight, lower blood pressure or just be healthier, it helps to know what a serving size is.   Grains 1 slice of bread,  bagel,  cup pasta or rice  Vegetables 1 cup fresh or raw vegetables,  cup cooked or canned Fruits 1 piece of medium sized fruit,  cup canned,   Meats/Proteins  cup dried       1 oz  meat, 1 egg,  cup cooked beans, nuts or seeds  Dairy        Fats Individual yogurt container, 1 cup (8oz)    1 teaspoon margarine/butter or vegetable  milk or milk alternative, 1 slice of cheese          oil; 1 tablespoon mayonnaise or salad dressing                   Plan ahead: make a menu of the meals for a week then create a grocery list to go with  that menu. Consider meals that easily stretch into a night of leftovers, such as stews or  casseroles. Or consider making two of your favorite meal and put one in the freezer for  another night. Try a night or two each week that is "meatless" or "no cook" such as salads.  When you get home from the grocery store wash and prepare your vegetables and fruits.  Then when you need them they are ready to go.  Tips for going to the grocery store: . Buy store or generic brands . Check the weekly ad from your store on-line or in their in-store flyer . Look at the unit price on the shelf tag to compare/contrast the costs of different items . Buy fruits/vegetables in season . Carrots, bananas and apples are low-cost, naturally healthy items . If meats or frozen vegetables are on sale, buy some extras and put in your freezer . Limit buying prepared or "ready to eat" items, even if they are pre-made salads or fruit snacks . Do not shop when you're hungry . Foods at eye level tend to be more expensive. Look on the high and low shelves for deals. . Consider shopping at the farmer's market for fresh foods in season. . Choose canned tuna or salmon instead of fresh . Avoid the cookie and chip aisles (these are expensive, high in calories and low in  nutritional value) Healthy food preparations: . If you can't get lean hamburger, be sure to drain the fat when cooking . Steam, saut (in olive oil), grill or bake foods . Experiment with different seasonings to avoid adding salt to your foods. Kosher salt, sea salt and Himalayan salt are all still salt and should be avoided         Resources: American Heart Association - MartiniMobile.it Go to the Healthy Living tab to get more information American Diabetes Association - www.diabetes.org You don't have to be diabetic - check out the Food and  Fitness tab  DASH diet - PopSteam.is Health topics - or just search on their home page for DASH Quit for Life - www.cancer.org Follow the Stay Healthy tab to learn more about smoking cessatio

## 2020-06-10 DIAGNOSIS — Z419 Encounter for procedure for purposes other than remedying health state, unspecified: Secondary | ICD-10-CM | POA: Diagnosis not present

## 2020-06-10 NOTE — L&D Delivery Note (Signed)
OB/GYN Faculty Practice Delivery Note  Andrea Burns is a 35 y.o. [id number] s/p SVD at [redacted]w[redacted]d. She was admitted for IOL for cHTN.   ROM: 0h 57m with clear fluid GBS Status:  Positive/-- (06/23 0948) Maximum Maternal Temperature: 98  Labor Progress: Initial SVE: 1cm. Received FB and cytotec x1, then was transitioned to pitocin. SROM. She then progressed to complete.   Delivery Date/Time: 00:17 on 7/4 Delivery: Called to room and patient was complete and pushing. Head delivered LOA. No nuchal cord present. Shoulder and body delivered in usual fashion. Infant with spontaneous cry, placed on mother's abdomen, dried and stimulated. Cord clamped x 2 after 1-minute delay, and cut by patient. Cord blood drawn. Placenta delivered spontaneously with gentle cord traction. Fundus firm with massage and Pitocin. TXA given due to grand multiparity. Labia, perineum, vagina, and cervix inspected inspected with no lacerations.  Baby Weight: pending  Placenta: Sent to L&D Complications: None Lacerations: none EBL: 100 mL Analgesia: Nitrous   Infant:  APGAR (1 MIN): 9   APGAR (5 MINS): 9   APGAR (10 MINS):     Casper Harrison, MD [facility] Family Medicine Fellow, [facility] for [facility], [address] [facility] 12/11/2020, 12:53 AM

## 2020-06-12 ENCOUNTER — Telehealth: Payer: Self-pay | Admitting: Licensed Clinical Social Worker

## 2020-06-12 ENCOUNTER — Encounter: Payer: Medicaid Other | Admitting: Licensed Clinical Social Worker

## 2020-06-12 ENCOUNTER — Other Ambulatory Visit: Payer: Self-pay | Admitting: *Deleted

## 2020-06-12 DIAGNOSIS — M79606 Pain in leg, unspecified: Secondary | ICD-10-CM

## 2020-06-12 NOTE — Telephone Encounter (Signed)
Called Andrea Burns twice and left voicemail regarding scheduled mychart.

## 2020-06-12 NOTE — Progress Notes (Signed)
Please order lower extremity venous Dopplers bilaterally-rule out DVT.  Leg pain.  Donato Schultz, MD

## 2020-06-12 NOTE — Progress Notes (Signed)
Please order lower extremity venous Dopplers bilaterally-rule out DVT.  Leg pain.  Donato Schultz, MD  Order placed for venous doppler per Dr Anne Fu

## 2020-06-13 ENCOUNTER — Other Ambulatory Visit: Payer: Self-pay

## 2020-06-13 ENCOUNTER — Ambulatory Visit (HOSPITAL_COMMUNITY)
Admission: RE | Admit: 2020-06-13 | Discharge: 2020-06-13 | Disposition: A | Discharge status: Home / Self Care | Payer: Medicaid Other | Source: Ambulatory Visit | Attending: Cardiovascular Disease | Admitting: Cardiovascular Disease

## 2020-06-13 DIAGNOSIS — M79606 Pain in leg, unspecified: Secondary | ICD-10-CM | POA: Diagnosis not present

## 2020-06-13 DIAGNOSIS — M79605 Pain in left leg: Secondary | ICD-10-CM

## 2020-06-13 DIAGNOSIS — M79604 Pain in right leg: Secondary | ICD-10-CM | POA: Diagnosis not present

## 2020-06-19 ENCOUNTER — Ambulatory Visit (INDEPENDENT_AMBULATORY_CARE_PROVIDER_SITE_OTHER): Payer: Medicaid Other | Admitting: Pharmacist

## 2020-06-19 ENCOUNTER — Ambulatory Visit (HOSPITAL_COMMUNITY): Payer: Medicaid Other | Attending: Cardiology

## 2020-06-19 ENCOUNTER — Other Ambulatory Visit: Payer: Self-pay

## 2020-06-19 VITALS — BP 140/90 | HR 74

## 2020-06-19 DIAGNOSIS — R079 Chest pain, unspecified: Secondary | ICD-10-CM | POA: Diagnosis not present

## 2020-06-19 DIAGNOSIS — O10919 Unspecified pre-existing hypertension complicating pregnancy, unspecified trimester: Secondary | ICD-10-CM | POA: Diagnosis not present

## 2020-06-19 LAB — ECHOCARDIOGRAM COMPLETE
Area-P 1/2: 3.42 cm2
S' Lateral: 3.5 cm

## 2020-06-19 NOTE — Patient Instructions (Addendum)
It was nice to see you today!  Please continue labetalol 600mg  (2 tablets) twice a day and nifedipine 60mg  daily  Please check your blood pressure once a day and write down those readings. Bring a list of those readings to your next appointment.  Try to go for some short walks  Call me with any questions at [phone]

## 2020-06-19 NOTE — Progress Notes (Signed)
Patient ID: Andrea Burns                 DOB: 12/02/1985                      MRN: [id number]     HPI: Andrea Burns is a 35 y.o. female referred by Dr. Anne Fu to HTN clinic. PMH is significant for HTN, cocaine abuse, depression and acute appendicitis. Patient is about [redacted] weeks pregnant. She has been HTN surrounding her pregnancies. Nifedipine was started by Dr. Eugene Garnet on 12/14. Increased from 30mg  to 60mg  on 12/16 by Dr. . Urine negative for proteinuria on 12/14.   Patient was last seen by HTN clinic on 06/08/20. Her blood pressure was 164/98 in clinic. It was discovered that patient was only taking labetalol 300mg  twice a day instead of 600mg , therefore labetalol was increased to 600mg  twice a day. Patient had lower extremity ultrasound done to rule out DVT due to leg pain. No DVT found.   Patient presents today to HTN clinic. She states she has only had one episode where she felt lightheaded. Still have times where she feels weak, rests and then feels fine. Some swelling in her foot that resolves on its own. Trying to walk, but is very fatigued. Only checked her blood pressure a few times. Was good when she checked it at [employer] 124/85. Has been compliant with her medications. Took them at 6:30 AM today. Still having issues with acid reflux and not sleeping well. She did bring her blood pressure machine with her.  Home BP cuff 142/96 HR 74 VS clinic manual cuff 140/90  Current HTN meds: labetalol 600mg  twice a day, nifedipine 60mg  daily BP goal: 120-160/80-105 per ACOG recommendations  Family History:   Social History: no tobacco, no ETOH, previous cocaine use (none now)  Diet: 1 soda (coke or Dr. 1/15) per day, no coffee/tea  Was Eating a lot of fast food less fast food now Eats a lot of candy Breakfast: egg, beans w/ sauce  Lunch:  Eats a lot of cheese: fresca cheese Pizza  Exercise: none  Home BP readings: 147/109, 159/94, 124/85 (@ [employer]) HR 84  Wt  Readings from Last 3 Encounters:  05/25/20 193 lb (87.5 kg)  05/23/20 197 lb (89.4 kg)  05/19/20 188 lb (85.3 kg)   BP Readings from Last 3 Encounters:  06/08/20 (!) 164/98  05/25/20 130/90  05/23/20 (!) 159/93   Pulse Readings from Last 3 Encounters:  06/08/20 79  05/25/20 80  05/23/20 78    Renal function: CrCl cannot be calculated (Patient's most recent lab result is older than the maximum 21 days allowed.).  Past Medical History:  Diagnosis Date  . Anxiety   . Depression   . GERD (gastroesophageal reflux disease)   . HA (headache)   . Hypertension   . 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 Medical history non-contributory   . Pneumonia    2013    Current Outpatient Medications on File Prior to Visit  Medication Sig Dispense Refill  . labetalol (NORMODYNE) 300 MG tablet Take 2 tablets (600 mg total) by mouth 2 (two) times daily. 120 tablet 5  . NIFEdipine (PROCARDIA XL/NIFEDICAL XL) 60 MG 24 hr tablet Take 1 tablet (60 mg total) by mouth daily. 90 tablet 3  . Prenatal Vit-Fe Fumarate-FA (MULTIVITAMIN-PRENATAL) 27-0.8 MG TABS tablet Take 1 tablet by mouth daily at 12 noon. 30 tablet 12  No current facility-administered medications on file prior to visit.    No Known Allergies  There were no vitals taken for this visit.   Assessment/Plan:  1. Hypertension - Blood pressure is at goal of 120-160/80-105 per ACOG recommendations in clinic today. I would ideally like patient closer to the 130's/80's. Will avoid lowering BP <120/80 due to the association with decreased uteroplacental perfusion. During second trimester a dose reduction may be needed due to physiologic drop in BP.  Will not make any blood pressure changes today. Continue labetalol 600mg  twice a day and nifedipine 60mg  daily. Follow up in clinic in 2 weeks. Can titrate labetalol if needed. I encouraged her to go for some short walks and this will help with fatigue and blood  pressure.   Thank you  , Pharm.D, BCPS, CPP [facility]  [address], [place], [address] [place]  Phone: [phone]; Fax: [phone]

## 2020-06-20 ENCOUNTER — Ambulatory Visit (INDEPENDENT_AMBULATORY_CARE_PROVIDER_SITE_OTHER): Payer: Medicaid Other | Admitting: Obstetrics and Gynecology

## 2020-06-20 ENCOUNTER — Encounter: Payer: Self-pay | Admitting: Obstetrics and Gynecology

## 2020-06-20 VITALS — BP 137/92 | HR 81 | Wt 190.0 lb

## 2020-06-20 DIAGNOSIS — O99340 Other mental disorders complicating pregnancy, unspecified trimester: Secondary | ICD-10-CM

## 2020-06-20 DIAGNOSIS — O0990 Supervision of high risk pregnancy, unspecified, unspecified trimester: Secondary | ICD-10-CM

## 2020-06-20 DIAGNOSIS — F1911 Other psychoactive substance abuse, in remission: Secondary | ICD-10-CM

## 2020-06-20 DIAGNOSIS — O10919 Unspecified pre-existing hypertension complicating pregnancy, unspecified trimester: Secondary | ICD-10-CM

## 2020-06-20 DIAGNOSIS — F32A Depression, unspecified: Secondary | ICD-10-CM

## 2020-06-20 MED ORDER — PANTOPRAZOLE SODIUM 40 MG PO TBEC: 40.0000 mg | DELAYED_RELEASE_TABLET | Freq: Every day | ORAL | 3 refills | Status: DC

## 2020-06-20 MED ORDER — PROMETHAZINE HCL 25 MG PO TABS: 25.0000 mg | ORAL_TABLET | Freq: Four times a day (QID) | ORAL | 2 refills | Status: DC | PRN

## 2020-06-20 NOTE — Progress Notes (Signed)
Pt is having increase in leg cramping, will have appt with Neurologist ?Feb. Pt would like Rx for N&V, having issues with constipation.  Pt also had visit with [facility] yesterday, unsure of results.

## 2020-06-20 NOTE — Progress Notes (Addendum)
   PRENATAL VISIT NOTE  Subjective:  Andrea Burns is a 35 y.o. [id number] at [redacted]w[redacted]d being seen today for ongoing prenatal care.  She is currently monitored for the following issues for this high-risk pregnancy and has Depression affecting pregnancy; Acute appendicitis; Chronic hypertension affecting pregnancy; Supervision of high risk pregnancy, antepartum; and History of substance abuse (HCC) on their problem list.  Patient reports heartburn and nausea.  Contractions: Not present.  .   . Denies leaking of fluid.   The following portions of the patient's history were reviewed and updated as appropriate: allergies, current medications, past family history, past medical history, past social history, past surgical history and problem list.   Objective:   Vitals:   06/20/20 0935  BP: (!) 137/92  Pulse: 81  Weight: 190 lb (86.2 kg)    Fetal Status: Fetal Heart Rate (bpm): 145         General:  Alert, oriented and cooperative. Patient is in no acute distress.  Skin: Skin is warm and dry. No rash noted.   Cardiovascular: Normal heart rate noted  Respiratory: Normal respiratory effort, no problems with respiration noted  Abdomen: Soft, gravid, appropriate for gestational age.  Pain/Pressure: Absent     Pelvic: Cervical exam deferred        Extremities: Normal range of motion.     Mental Status: Normal mood and affect. Normal behavior. Normal judgment and thought content.   Assessment and Plan:  Pregnancy: [id number] at [redacted]w[redacted]d 1. Supervision of high risk pregnancy, antepartum Patient is doing well Rx phenergan and protonix provided Anatomy ultrasound ordered - US MFM OB DETAIL +14 WK; Future  2. Chronic hypertension affecting pregnancy BP better controlled on labetalol 600 BID and procardia 60 daily Normal echo with EF 60% Follow up with cardiologist on 1/25  3. Depression affecting pregnancy stable  4. History of substance abuse [facility]) Patient denies recent use  Preterm labor  symptoms and general obstetric precautions including but not limited to vaginal bleeding, contractions, leaking of fluid and fetal movement were reviewed in detail with the patient. Please refer to After Visit Summary for other counseling recommendations.   Return in about 4 weeks (around 07/18/2020) for in person, ROB, High risk.  Future Appointments  Date Time Provider Department Center  07/04/2020 11:00 AM [facility] PHARMACIST [facility]  07/18/2020  9:30 AM Warden Fillers, MD [facility] None  08/03/2020  8:30 AM Levert Feinstein, MD [facility] None  09/04/2020  9:00 AM Jake Bathe, MD [facility]    Catalina Antigua, MD

## 2020-07-01 ENCOUNTER — Inpatient Hospital Stay (HOSPITAL_COMMUNITY): Payer: Medicaid Other

## 2020-07-01 ENCOUNTER — Other Ambulatory Visit: Payer: Self-pay

## 2020-07-01 ENCOUNTER — Encounter (HOSPITAL_COMMUNITY): Payer: Self-pay | Admitting: Obstetrics and Gynecology

## 2020-07-01 ENCOUNTER — Inpatient Hospital Stay (HOSPITAL_COMMUNITY)
Admission: AD | Admit: 2020-07-01 | Discharge: 2020-07-02 | Disposition: A | Discharge status: Home Health | Payer: Medicaid Other | Source: Ambulatory Visit | Attending: Obstetrics and Gynecology | Admitting: Obstetrics and Gynecology

## 2020-07-01 DIAGNOSIS — O99612 Diseases of the digestive system complicating pregnancy, second trimester: Secondary | ICD-10-CM | POA: Insufficient documentation

## 2020-07-01 DIAGNOSIS — K219 Gastro-esophageal reflux disease without esophagitis: Secondary | ICD-10-CM | POA: Insufficient documentation

## 2020-07-01 DIAGNOSIS — K8020 Calculus of gallbladder without cholecystitis without obstruction: Secondary | ICD-10-CM | POA: Insufficient documentation

## 2020-07-01 DIAGNOSIS — R109 Unspecified abdominal pain: Secondary | ICD-10-CM | POA: Insufficient documentation

## 2020-07-01 DIAGNOSIS — O26892 Other specified pregnancy related conditions, second trimester: Secondary | ICD-10-CM | POA: Diagnosis not present

## 2020-07-01 DIAGNOSIS — Z3A14 14 weeks gestation of pregnancy: Secondary | ICD-10-CM | POA: Insufficient documentation

## 2020-07-01 DIAGNOSIS — O26611 Liver and biliary tract disorders in pregnancy, first trimester: Secondary | ICD-10-CM

## 2020-07-01 DIAGNOSIS — O26612 Liver and biliary tract disorders in pregnancy, second trimester: Secondary | ICD-10-CM | POA: Diagnosis not present

## 2020-07-01 DIAGNOSIS — O26899 Other specified pregnancy related conditions, unspecified trimester: Secondary | ICD-10-CM

## 2020-07-01 LAB — CBC WITH DIFFERENTIAL/PLATELET
Abs Immature Granulocytes: 0.04 10*3/uL (ref 0.00–0.07)
Basophils Absolute: 0 10*3/uL (ref 0.0–0.1)
Basophils Relative: 0 %
Eosinophils Absolute: 0.2 10*3/uL (ref 0.0–0.5)
Eosinophils Relative: 1 %
HCT: 35.1 % — ABNORMAL LOW (ref 36.0–46.0)
Hemoglobin: 12.3 g/dL (ref 12.0–15.0)
Immature Granulocytes: 0 %
Lymphocytes Relative: 23 %
Lymphs Abs: 2.9 10*3/uL (ref 0.7–4.0)
MCH: 29.6 pg (ref 26.0–34.0)
MCHC: 35 g/dL (ref 30.0–36.0)
MCV: 84.6 fL (ref 80.0–100.0)
Monocytes Absolute: 0.7 10*3/uL (ref 0.1–1.0)
Monocytes Relative: 6 %
Neutro Abs: 8.9 10*3/uL — ABNORMAL HIGH (ref 1.7–7.7)
Neutrophils Relative %: 70 %
Platelets: 259 10*3/uL (ref 150–400)
RBC: 4.15 MIL/uL (ref 3.87–5.11)
RDW: 12.2 % (ref 11.5–15.5)
WBC: 12.7 10*3/uL — ABNORMAL HIGH (ref 4.0–10.5)
nRBC: 0 % (ref 0.0–0.2)

## 2020-07-01 LAB — URINALYSIS, ROUTINE W REFLEX MICROSCOPIC
Bilirubin Urine: NEGATIVE
Glucose, UA: NEGATIVE mg/dL
Ketones, ur: 5 mg/dL — AB
Nitrite: NEGATIVE
Protein, ur: NEGATIVE mg/dL
Specific Gravity, Urine: 1.018 (ref 1.005–1.030)
pH: 5 (ref 5.0–8.0)

## 2020-07-01 MED ORDER — ALUM & MAG HYDROXIDE-SIMETH 200-200-20 MG/5ML PO SUSP
30.0000 mL | Freq: Once | ORAL | Status: AC
  Administered 2020-07-01: 30 mL via ORAL
  Filled 2020-07-01: qty 30

## 2020-07-01 MED ORDER — LIDOCAINE VISCOUS HCL 2 % MT SOLN
15.0000 mL | Freq: Once | OROMUCOSAL | Status: AC
  Administered 2020-07-01: 15 mL via ORAL
  Filled 2020-07-01: qty 15

## 2020-07-01 NOTE — MAU Note (Signed)
Patient reports some sharp abdominal pains yesterday that felt like an electric shock.  Now having upper abdominal pain and just feels off.  This morning she had 1 episode of vomiting.  Endorses feeling fatigued all day today.  Also took her BP at home 168/98 and has a HA.  

## 2020-07-01 NOTE — MAU Provider Note (Signed)
History     CSN: [id number]  Arrival date and time: 07/01/20 2149   Event Date/Time   First Provider Initiated Contact with Patient 07/01/20 2240      Chief Complaint  Patient presents with  . Abdominal Pain   Andrea Burns is a 35 y.o. [id number] at [redacted]w[redacted]d who receives care at [facility].  She presents today for Abdominal Pain. She reports she has been experiencing abdominal pain all day.  She reports taking tylenol for a HA, but it did not bring about relief to her pain.  She is unable to describe the pain, but notes it does not feel like bloating, pressure, or cramping. She states the pain was initially in her upper abdomen and is now "in the whole stomach area."  She reports eating "3 spoons of soup because I don't feel good."  She states she was unable to keep anything down, but is not currently nauseous.  She reports taking all doses of her medication and her last dose of labetalol (300mg ) one hour prior to arrival.   OB History    Gravida  6   Para  4   Term  4   Preterm      AB  1   Living  4     SAB  1   IAB      Ectopic      Multiple      Live Births  4           Past Medical History:  Diagnosis Date  . Anxiety   . Depression   . GERD (gastroesophageal reflux disease)   . HA (headache)   . Hypertension   . 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 Medical history non-contributory   . Pneumonia    2013    Past Surgical History:  Procedure Laterality Date  . COLPOSCOPY W/ BIOPSY / CURETTAGE    . IUD REMOVAL    . LAPAROSCOPIC APPENDECTOMY N/A 10/18/2019   Procedure: APPENDECTOMY LAPAROSCOPIC;  Surgeon: 12/18/2019, MD;  Location: [facility];  Service: General;  Laterality: N/A;  . LAPAROSCOPY ABDOMEN DIAGNOSTIC     Removal of migrated IUD   . NO PAST SURGERIES      Family History  Problem Relation Age of Onset  . Diabetes Mother   . Hypertension Mother   . Cancer Mother   . Diabetes Father   . Cancer  Paternal Grandmother        liver & lung    Social History   Tobacco Use  . Smoking status: Former Smoker    Packs/day: 0.25    Years: 1.00    Pack years: 0.25    Quit date: 05/03/2005    Years since quitting: 15.1  . Smokeless tobacco: Never Used  Vaping Use  . Vaping Use: Never used  Substance Use Topics  . Alcohol use: Not Currently    Alcohol/week: 0.0 standard drinks  . Drug use: Not Currently    Types: Cocaine    Comment: last used 3 to 4 months ago    Allergies: No Known Allergies  Medications Prior to Admission  Medication Sig Dispense Refill Last Dose  . labetalol (NORMODYNE) 300 MG tablet Take 2 tablets (600 mg total) by mouth 2 (two) times daily. (Patient taking differently: Take 300 mg by mouth 2 (two) times daily.) 120 tablet 5 07/01/2020 at Unknown time  . NIFEdipine (PROCARDIA XL/NIFEDICAL XL) 60 MG 24 hr tablet Take  1 tablet (60 mg total) by mouth daily. 90 tablet 3 07/01/2020 at Unknown time  . pantoprazole (PROTONIX) 40 MG tablet Take 1 tablet (40 mg total) by mouth daily. 30 tablet 3   . Prenatal Vit-Fe Fumarate-FA (MULTIVITAMIN-PRENATAL) 27-0.8 MG TABS tablet Take 1 tablet by mouth daily at 12 noon. (Patient not taking: Reported on 06/20/2020) 30 tablet 12   . promethazine (PHENERGAN) 25 MG tablet Take 1 tablet (25 mg total) by mouth every 6 (six) hours as needed for nausea or vomiting. 30 tablet 2     Review of Systems  Constitutional: Negative for chills and fever.  Respiratory: Negative for cough, chest tightness and shortness of breath.   Cardiovascular: Negative for chest pain.  Gastrointestinal: Positive for abdominal pain, constipation (Yesterday: hard to pass), nausea (None currently) and vomiting. Negative for diarrhea.  Genitourinary: Negative for difficulty urinating, dysuria, pelvic pain, vaginal bleeding and vaginal discharge.  Neurological: Positive for headaches (4/10). Negative for dizziness and light-headedness.   Physical Exam   Blood  pressure 136/77, pulse 86, temperature 98.3 F (36.8 C), resp. rate 19, SpO2 96 %.  Physical Exam Vitals reviewed.  Constitutional:      Appearance: Normal appearance. She is well-developed.  HENT:     Head: Normocephalic and atraumatic.  Eyes:     Conjunctiva/sclera: Conjunctivae normal.  Cardiovascular:     Rate and Rhythm: Normal rate and regular rhythm.     Heart sounds: Normal heart sounds.  Pulmonary:     Effort: Pulmonary effort is normal. No respiratory distress.     Breath sounds: Normal breath sounds.  Abdominal:     General: Bowel sounds are normal.     Palpations: Abdomen is soft.     Tenderness: There is generalized abdominal tenderness and tenderness in the right upper quadrant.  Musculoskeletal:     Cervical back: Normal range of motion.  Skin:    General: Skin is warm and dry.  Neurological:     Mental Status: She is alert and oriented to person, place, and time.  Psychiatric:        Mood and Affect: Mood normal.        Behavior: Behavior normal.        Thought Content: Thought content normal.     MAU Course  Procedures Results for orders placed or performed during the hospital encounter of 07/01/20 (from the past 24 hour(s))  Urinalysis, Routine w reflex microscopic Urine, Clean Catch     Status: Abnormal   Collection Time: 07/01/20 10:49 PM  Result Value Ref Range   Color, Urine YELLOW YELLOW   APPearance HAZY (A) CLEAR   Specific Gravity, Urine 1.018 1.005 - 1.030   pH 5.0 5.0 - 8.0   Glucose, UA NEGATIVE NEGATIVE mg/dL   Hgb urine dipstick MODERATE (A) NEGATIVE   Bilirubin Urine NEGATIVE NEGATIVE   Ketones, ur 5 (A) NEGATIVE mg/dL   Protein, ur NEGATIVE NEGATIVE mg/dL   Nitrite NEGATIVE NEGATIVE   Leukocytes,Ua SMALL (A) NEGATIVE   RBC / HPF 6-10 0 - 5 RBC/hpf   WBC, UA 11-20 0 - 5 WBC/hpf   Bacteria, UA RARE (A) NONE SEEN   Squamous Epithelial / LPF 6-10 0 - 5   Mucus PRESENT    Ca Oxalate Crys, UA PRESENT   CBC with Differential/Platelet      Status: Abnormal   Collection Time: 07/01/20 11:22 PM  Result Value Ref Range   WBC 12.7 (H) 4.0 - 10.5 K/uL   RBC 4.15  3.87 - 5.11 MIL/uL   Hemoglobin 12.3 12.0 - 15.0 g/dL   HCT 16.135.1 (L) 09.636.0 - 04.546.0 %   MCV 84.6 80.0 - 100.0 fL   MCH 29.6 26.0 - 34.0 pg   MCHC 35.0 30.0 - 36.0 g/dL   RDW 40.912.2 81.111.5 - 91.415.5 %   Platelets 259 150 - 400 K/uL   nRBC 0.0 0.0 - 0.2 %   Neutrophils Relative % 70 %   Neutro Abs 8.9 (H) 1.7 - 7.7 K/uL   Lymphocytes Relative 23 %   Lymphs Abs 2.9 0.7 - 4.0 K/uL   Monocytes Relative 6 %   Monocytes Absolute 0.7 0.1 - 1.0 K/uL   Eosinophils Relative 1 %   Eosinophils Absolute 0.2 0.0 - 0.5 K/uL   Basophils Relative 0 %   Basophils Absolute 0.0 0.0 - 0.1 K/uL   Immature Granulocytes 0 %   Abs Immature Granulocytes 0.04 0.00 - 0.07 K/uL  Lipase, blood     Status: None   Collection Time: 07/01/20 11:22 PM  Result Value Ref Range   Lipase 39 11 - 51 U/L  Comprehensive metabolic panel     Status: Abnormal   Collection Time: 07/01/20 11:22 PM  Result Value Ref Range   Sodium 136 135 - 145 mmol/L   Potassium 3.3 (L) 3.5 - 5.1 mmol/L   Chloride 103 98 - 111 mmol/L   CO2 21 (L) 22 - 32 mmol/L   Glucose, Bld 99 70 - 99 mg/dL   BUN 10 6 - 20 mg/dL   Creatinine, Ser 7.820.78 0.44 - 1.00 mg/dL   Calcium 9.7 8.9 - 95.610.3 mg/dL   Total Protein 6.7 6.5 - 8.1 g/dL   Albumin 3.3 (L) 3.5 - 5.0 g/dL   AST 21 15 - 41 U/L   ALT 23 0 - 44 U/L   Alkaline Phosphatase 53 38 - 126 U/L   Total Bilirubin 0.2 (L) 0.3 - 1.2 mg/dL   GFR, Estimated >21>60 >30>60 mL/min   Anion gap 12 5 - 15   US Abdomen Complete  Result Date: 07/02/2020 CLINICAL DATA:  Initial evaluation for acute abdominal pain. History of prior appendectomy. EXAM: ABDOMEN ULTRASOUND COMPLETE COMPARISON:  Prior CT from 10/06/2019 FINDINGS: Gallbladder: Few tiny echogenic subcentimeter stones seen within the gallbladder lumen. Gallbladder wall measures within normal limits at 2.8 mm. No free pericholecystic fluid. No  sonographic Murphy sign elicited on exam. Common bile duct: Diameter: 2 mm Liver: No focal lesion identified. Within normal limits in parenchymal echogenicity. Portal vein is patent on color Doppler imaging with normal direction of blood flow towards the liver. IVC: No abnormality visualized. Pancreas: Visualized portion unremarkable. Spleen: Size and appearance within normal limits. Right Kidney: Length: 10.2 cm. Echogenicity within normal limits. No mass or hydronephrosis visualized. Left Kidney: Length: 12.7 cm. Echogenicity within normal limits. No mass or hydronephrosis visualized. Abdominal aorta: No aneurysm visualized. Other findings: None. IMPRESSION: 1. Cholelithiasis. No sonographic features to suggest acute cholecystitis. No biliary dilatation. 2. Otherwise unremarkable and normal abdominal ultrasound. Electronically Signed   By: Rise MuBenjamin  McClintock M.D.   On: 07/02/2020 00:26    MDM Physical Exam Labs: CMP, CBC, UA GI Cocktail Abdominal US Assessment and Plan  35 year old, [id number]  SIUP at 14.3weeks Abdominal Pain  -Reviewed POC with patient. -Exam performed and findings discussed.  -Labs ordered. -Discussed usage of GI cocktail for pain and discomfort. -Informed that if symptoms persist, would give pain medication. -Will send for US and await results.  Cherre Robins 07/01/2020, 10:40 PM   Reassessment (1:15 AM) Cholelithiasis  -[place] returns as above.  -Provider to bedside to discuss results. -Informed of gallstones and need for diet modifications. -Encouraged to reduce fats and monitor symptoms. -Discussed potential for surgical intervention with continued or worsening symptoms.  -Patient instructed to take tylenol at home as need. -Encouraged to call or return to MAU if symptoms worsen or with the onset of new symptoms. -Discharged to home in stable condition.  Cherre Robins MSN, CNM Advanced Practice Provider, [facility]

## 2020-07-01 NOTE — MAU Note (Incomplete Revision)
Patient reports some sharp abdominal pains yesterday that felt like an electric shock.  Now having upper abdominal pain and just feels off.  This morning she had 1 episode of vomiting.  Endorses feeling fatigued all day today.  Also took her BP at home 168/98 and has a HA.

## 2020-07-02 DIAGNOSIS — K8020 Calculus of gallbladder without cholecystitis without obstruction: Secondary | ICD-10-CM | POA: Diagnosis not present

## 2020-07-02 LAB — COMPREHENSIVE METABOLIC PANEL
ALT: 23 U/L (ref 0–44)
AST: 21 U/L (ref 15–41)
Albumin: 3.3 g/dL — ABNORMAL LOW (ref 3.5–5.0)
Alkaline Phosphatase: 53 U/L (ref 38–126)
Anion gap: 12 (ref 5–15)
BUN: 10 mg/dL (ref 6–20)
CO2: 21 mmol/L — ABNORMAL LOW (ref 22–32)
Calcium: 9.7 mg/dL (ref 8.9–10.3)
Chloride: 103 mmol/L (ref 98–111)
Creatinine, Ser: 0.78 mg/dL (ref 0.44–1.00)
GFR, Estimated: >60 mL/min (ref >60)
Glucose, Bld: 99 mg/dL (ref 70–99)
Potassium: 3.3 mmol/L — ABNORMAL LOW (ref 3.5–5.1)
Sodium: 136 mmol/L (ref 135–145)
Total Bilirubin: 0.2 mg/dL — ABNORMAL LOW (ref 0.3–1.2)
Total Protein: 6.7 g/dL (ref 6.5–8.1)

## 2020-07-02 LAB — LIPASE, BLOOD: Lipase: 39 U/L (ref 11–51)

## 2020-07-02 NOTE — Discharge Instructions (Signed)
Gallbladder Eating Plan If you have a gallbladder condition, you may have trouble digesting fats. Eating a low-fat diet can help reduce your symptoms, and may be helpful before and after having surgery to remove your gallbladder (cholecystectomy). Your health care provider may recommend that you work with a diet and nutrition specialist (dietitian) to help you reduce the amount of fat in your diet. What are tips for following this plan? General guidelines  Limit your fat intake to less than 30% of your total daily calories. If you eat around 1,800 calories each day, this is less than 60 grams (g) of fat per day.  Fat is an important part of a healthy diet. Eating a low-fat diet can make it hard to maintain a healthy body weight. Ask your dietitian how much fat, calories, and other nutrients you need each day.  Eat small, frequent meals throughout the day instead of three large meals.  Drink at least 8-10 cups of fluid a day. Drink enough fluid to keep your urine clear or pale yellow.  Limit alcohol intake to no more than 1 drink a day for nonpregnant women and 2 drinks a day for men. One drink equals 12 oz of beer, 5 oz of wine, or 1 oz of hard liquor. Reading food labels  Check Nutrition Facts on food labels for the amount of fat per serving. Choose foods with less than 3 grams of fat per serving.   Shopping  Choose nonfat and low-fat healthy foods. Look for the words "nonfat," "low fat," or "fat free."  Avoid buying processed or prepackaged foods. Cooking  Cook using low-fat methods, such as baking, broiling, grilling, or boiling.  Cook with small amounts of healthy fats, such as olive oil, grapeseed oil, canola oil, or sunflower oil. What foods are recommended?  All fresh, frozen, or canned fruits and vegetables.  Whole grains.  Low-fat or non-fat (skim) milk and yogurt.  Lean meat, skinless poultry, fish, eggs, and beans.  Low-fat protein supplement powders or  drinks.  Spices and herbs. What foods are not recommended?  High-fat foods. These include baked goods, fast food, fatty cuts of meat, ice cream, french toast, sweet rolls, pizza, cheese bread, foods covered with butter, creamy sauces, or cheese.  Fried foods. These include french fries, tempura, battered fish, breaded chicken, fried breads, and sweets.  Foods with strong odors.  Foods that cause bloating and gas. Summary  A low-fat diet can be helpful if you have a gallbladder condition, or before and after gallbladder surgery.  Limit your fat intake to less than 30% of your total daily calories. This is about 60 g of fat if you eat 1,800 calories each day.  Eat small, frequent meals throughout the day instead of three large meals. This information is not intended to replace advice given to you by your health care provider. Make sure you discuss any questions you have with your health care provider. Document Revised: 01/13/2020 Document Reviewed: 01/13/2020 Elsevier Patient Education  2021 Elsevier Inc. Cholelithiasis  Cholelithiasis is a disease in which gallstones form in the gallbladder. The gallbladder is an organ that stores bile. Bile is a fluid that helps to digest fats. Gallstones begin as small crystals and can slowly grow into stones. They may cause no symptoms until they block the gallbladder duct, or cystic duct, when the gallbladder tightens (contracts) after food is eaten. This can cause pain and is known as a gallbladder attack, or biliary colic. There are two main types of  gallstones:  Cholesterol stones. These are the most common type of gallstone. These stones are made of hardened cholesterol and are usually yellow-green in color. Cholesterol is a fat-like substance that is made in the liver.  Pigment stones. These are dark in color and are made of a red-yellow substance, called bilirubin,that forms when hemoglobin from red blood cells breaks down. What are the  causes? This condition may be caused by an imbalance in the different parts that make bile. This can happen if the bile:  Has too much bilirubin. This can happen in certain blood diseases, such as sickle cell anemia.  Has too much cholesterol.  Does not have enough bile salts. These salts help the body absorb and digest fats. In some cases, this condition can also be caused by the gallbladder not emptying completely or often enough. This is common during pregnancy. What increases the risk? The following factors may make you more likely to develop this condition:  Being female.  Having multiple pregnancies. Health care providers sometimes advise removing diseased gallbladders before future pregnancies.  Eating a diet that is heavy in fried foods, fat, and refined carbohydrates, such as white bread and white rice.  Being obese.  Being older than age 40.  Using medicines that contain female hormones (estrogen) for a long time.  Losing weight quickly.  Having a family history of gallstones.  Having certain medical problems, such as: ? Diabetes mellitus. ? Cystic fibrosis. ? Crohn's disease. ? Cirrhosis or other long-term (chronic) liver disease. ? Certain blood diseases, such as sickle cell anemia or leukemia. What are the signs or symptoms? In many cases, having gallstones causes no symptoms. When you have gallstones but do not have symptoms, you have silent gallstones. If a gallstone blocks your bile duct, it can cause a gallbladder attack. The main symptom of a gallbladder attack is sudden pain in the upper right part of the abdomen. The pain:  Usually comes at night or after eating.  Can last for one hour or more.  Can spread to your right shoulder, back, or chest.  Can feel like indigestion. This is discomfort, burning, or fullness in your upper abdomen. If the bile duct is blocked for more than a few hours, it can cause an infection or inflammation of your gallbladder  (cholecystitis), liver, or pancreas. This can cause:  Nausea or vomiting.  Bloating.  Pain in your abdomen that lasts for 5 hours or longer.  Tenderness in your upper abdomen, often in the upper right section and under your rib cage.  Fever or chills.  Skin or the white parts of your eyes turning yellow (jaundice). This usually happens when a stone has blocked bile from passing through the common bile duct.  Dark urine or light-colored stools. How is this diagnosed? This condition may be diagnosed based on:  A physical exam.  Your medical history.  Ultrasound.  CT scan.  MRI. You may also have other tests, including:  Blood tests to check for signs of an infection or inflammation.  Cholescintigraphy, or HIDA scan. This is a scan of your gallbladder and bile ducts (biliary system) using non-harmful radioactive material and special cameras that can see the radioactive material.  Endoscopic retrograde cholangiopancreatogram. This involves inserting a small tube with a camera on the end (endoscope) through your mouth to look at bile ducts and check for blockages. How is this treated? Treatment for this condition depends on the severity of the condition. Silent gallstones do not need treatment.   Treatment may be needed if a blockage causes a gallbladder attack or other symptoms. Treatment may include:  Home care, if symptoms are not severe. ? During a simple gallbladder attack, stop eating and drinking for 12-24 hours (except for water and clear liquids). This helps to "cool down" your gallbladder. After 1 or 2 days, you can start to eat a diet of simple or clear foods, such as broths and crackers. ? You may also need medicines for pain or nausea or both. ? If you have cholecystitis and an infection, you will need antibiotics.  A hospital stay, if needed for pain control or for cholecystitis with severe infection.  Cholecystectomy, or surgery to remove your gallbladder. This is  the most common treatment if all other treatments have not worked.  Medicines to break up gallstones. These are most effective at treating small gallstones. Medicines may be used for up to 6-12 months.  Endoscopic retrograde cholangiopancreatogram. A small basket can be attached to the endoscope and used to capture and remove gallstones, mainly those that are in the common bile duct. Follow these instructions at home: Medicines  Take over-the-counter and prescription medicines only as told by your health care provider.  If you were prescribed an antibiotic medicine, take it as told by your health care provider. Do not stop taking the antibiotic even if you start to feel better.  Ask your health care provider if the medicine prescribed to you requires you to avoid driving or using machinery. Eating and drinking  Drink enough fluid to keep your urine pale yellow. This is important during a gallbladder attack. Water and clear liquids are preferred.  Follow a healthy diet. This includes: ? Reducing fatty foods, such as fried food and foods high in cholesterol. ? Reducing refined carbohydrates, such as white bread and white rice. ? Eating more fiber. Aim for foods such as almonds, fruit, and beans. Alcohol use  If you drink alcohol: ? Limit how much you use to:  0-1 drink a day for nonpregnant women.  0-2 drinks a day for men. ? Be aware of how much alcohol is in your drink. In the [place] one drink equals one 12 oz bottle of beer (355 mL), one 5 oz glass of wine (148 mL), or one 1 oz glass of hard liquor (44 mL). General instructions  Do not use any products that contain nicotine or tobacco, such as cigarettes, e-cigarettes, and chewing tobacco. If you need help quitting, ask your health care provider.  Maintain a healthy weight.  Keep all follow-up visits as told by your health care provider. These may include consultations with a surgeon or specialist. This is important. Where to  find more information  National Institute of Diabetes and Digestive and Kidney Diseases: www.niddk.nih.gov Contact a health care provider if:  You think you have had a gallbladder attack.  You have been diagnosed with silent gallstones and you develop pain in your abdomen or indigestion.  You begin to have attacks more often.  You have dark urine or light-colored stools. Get help right away if:  You have pain from a gallbladder attack that lasts for more than 2 hours.  You have pain in your abdomen that lasts for more than 5 hours or is getting worse.  You have a fever or chills.  You have nausea and vomiting that do not go away.  You develop jaundice. Summary  Cholelithiasis is a disease in which gallstones form in the gallbladder.  This condition may be   caused by an imbalance in the different parts that make bile. This can happen if your bile has too much bilirubin or cholesterol, or does not have enough bile salts.  Treatment for gallstones depends on the severity of the condition. Silent gallstones do not need treatment.  If gallstones cause a gallbladder attack or other symptoms, treatment usually involves not eating or drinking anything. Treatment may also include pain medicines and antibiotics, and it sometimes includes a hospital stay.  Surgery to remove the gallbladder is common if all other treatments have not worked. This information is not intended to replace advice given to you by your health care provider. Make sure you discuss any questions you have with your health care provider. Document Revised: 04/19/2019 Document Reviewed: 04/19/2019 Elsevier Patient Education  2021 Elsevier Inc.  

## 2020-07-03 LAB — CULTURE, OB URINE

## 2020-07-04 ENCOUNTER — Ambulatory Visit (INDEPENDENT_AMBULATORY_CARE_PROVIDER_SITE_OTHER): Payer: Medicaid Other | Admitting: Pharmacist

## 2020-07-04 ENCOUNTER — Other Ambulatory Visit: Payer: Self-pay

## 2020-07-04 VITALS — BP 138/90 | HR 77

## 2020-07-04 DIAGNOSIS — O10919 Unspecified pre-existing hypertension complicating pregnancy, unspecified trimester: Secondary | ICD-10-CM

## 2020-07-04 MED ORDER — LABETALOL HCL 300 MG PO TABS: 750.0000 mg | ORAL_TABLET | Freq: Two times a day (BID) | ORAL | 3 refills | Status: DC

## 2020-07-04 NOTE — Progress Notes (Signed)
Patient ID: Andrea Burns                 DOB: 1986-01-24                      MRN: [id number]     HPI: Andrea Burns is a 35 y.o. female referred by Dr. Anne Fu to HTN clinic. PMH is significant for HTN, cocaine abuse, depression and acute appendicitis. Patient is about [redacted] weeks pregnant. She has been HTN surrounding her pregnancies. Nifedipine was started by Dr. Eugene Garnet on 12/14. Increased from 30mg  to 60mg  on 12/16 by Dr. . Urine negative for proteinuria on 12/14.   Patient was last seen by HTN clinic on 06/19/20. Her blood pressure was 140/90 in clinic. No medication changes were made. She was seen at [facility] for cholelithiasis. BP was actually pretty good in hospital, 136/77.   She presents today for follow up. She denies dizziness, lightheadedness. One headache, very minimal swelling. BP at home has been a little higher 140's-150's/90's mostly. States she is feeling better at gallstone attack. Pain is much better. She has stopped drinking soda. No candy lately. BP in office 138/90. She may go back to 08/17/20 with her dad for 2 weeks.  Current HTN meds: labetalol 600mg  twice a day, nifedipine 60mg  daily BP goal: 120-160/80-105 per ACOG recommendations  Family History:   Social History: no tobacco, no ETOH, previous cocaine use (none now)  Diet: 1 soda (coke or Dr. [facility]) per day, no coffee/tea  Was Eating a lot of fast food less fast food now Eats a lot of candy Breakfast: egg, beans w/ sauce  Lunch:  Eats a lot of cheese: fresca cheese Pizza  Exercise: none  Home BP readings: 142/94, 148/88, 149/98, 160/96 (headache), 150/93, 140/92, 158/99, 154/102 (gallstones), 152/96, 130/98, 147/96  Wt Readings from Last 3 Encounters:  06/20/20 190 lb (86.2 kg)  05/25/20 193 lb (87.5 kg)  05/23/20 197 lb (89.4 kg)   BP Readings from Last 3 Encounters:  07/01/20 136/77  06/20/20 (!) 137/92  06/19/20 140/90   Pulse Readings from Last 3 Encounters:  07/01/20 86   06/20/20 81  06/19/20 74    Renal function: Estimated Creatinine Clearance: 106.3 mL/min (by C-G formula based on SCr of 0.78 mg/dL).  Past Medical History:  Diagnosis Date  . Anxiety   . Depression   . GERD (gastroesophageal reflux disease)   . HA (headache)   . Hypertension   . 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 Medical history non-contributory   . Pneumonia    2013    Current Outpatient Medications on File Prior to Visit  Medication Sig Dispense Refill  . labetalol (NORMODYNE) 300 MG tablet Take 2 tablets (600 mg total) by mouth 2 (two) times daily. (Patient taking differently: Take 300 mg by mouth 2 (two) times daily.) 120 tablet 5  . NIFEdipine (PROCARDIA XL/NIFEDICAL XL) 60 MG 24 hr tablet Take 1 tablet (60 mg total) by mouth daily. 90 tablet 3  . pantoprazole (PROTONIX) 40 MG tablet Take 1 tablet (40 mg total) by mouth daily. 30 tablet 3  . Prenatal Vit-Fe Fumarate-FA (MULTIVITAMIN-PRENATAL) 27-0.8 MG TABS tablet Take 1 tablet by mouth daily at 12 noon. (Patient not taking: Reported on 06/20/2020) 30 tablet 12  . promethazine (PHENERGAN) 25 MG tablet Take 1 tablet (25 mg total) by mouth every 6 (six) hours as needed for nausea or vomiting. 30 tablet 2  No current facility-administered medications on file prior to visit.    No Known Allergies  There were no vitals taken for this visit.   Assessment/Plan:  1. Hypertension - Blood pressure is at goal of 120-160/80-105 per ACOG recommendations in clinic today. I would ideally like patient closer to the 130's/80's- at least consistently <140/90. Will avoid lowering BP <120/80 due to the association with decreased uteroplacental perfusion. During second trimester a dose reduction may be needed due to physiologic drop in BP. Increase labetalol to 750mg  twice a day and continue nifedipine 60mg  daily. Follow up in clinic in 4 weeks due to pt trip to .   Thank you  , Pharm.D, BCPS, CPP [facility]  [address] [place], [address] [place]  Phone: [phone]; Fax: [phone]

## 2020-07-04 NOTE — Patient Instructions (Addendum)
Please increase your labetalol to 2.5 tablets (750mg ) twice a day Continue nifedipine 60mg  daily  Continue to avoid sugar - ie soda, desserts, candy Avoid fried foods and a lot of cheese  Call me at [phone] with any questions

## 2020-07-11 DIAGNOSIS — Z419 Encounter for procedure for purposes other than remedying health state, unspecified: Secondary | ICD-10-CM | POA: Diagnosis not present

## 2020-07-18 ENCOUNTER — Encounter: Payer: Medicaid Other | Admitting: Obstetrics and Gynecology

## 2020-07-31 ENCOUNTER — Ambulatory Visit: Payer: Medicaid Other

## 2020-07-31 ENCOUNTER — Encounter: Payer: Medicaid Other | Admitting: Obstetrics and Gynecology

## 2020-07-31 NOTE — Progress Notes (Deleted)
Patient ID: Andrea Burns                 DOB: 08/17/1985                      MRN: [id number]     HPI: Andrea Burns is a 35 y.o. female referred by Dr. Anne Fu to HTN clinic. PMH is significant for HTN, cocaine abuse, depression and acute appendicitis. Patient is about [redacted] weeks pregnant. She has been HTN surrounding her pregnancies. Nifedipine was started by Dr. Eugene Garnet on 12/14. Increased from 30mg  to 60mg  on 12/16 by Dr. . Urine negative for proteinuria on 12/14.   Patient was last seen by HTN clinic on 07/04/20. Her blood pressure was 138/90 in clinic. Labetolol was increased to 750mg  BID.   1/15 Home bp Dizziness, lightheadedness, headache, blurred vision, SOB, swelling Sodium?   Current HTN meds: labetalol 750mg  twice a day, nifedipine 60mg  daily BP goal: 120-160/80-105 per ACOG recommendations  Family History:   Social History: no tobacco, no ETOH, previous cocaine use (none now)  Diet: 1 soda (coke or Dr. 07/06/20) per day, no coffee/tea  Was Eating a lot of fast food less fast food now Eats a lot of candy Breakfast: egg, beans w/ sauce  Lunch:  Eats a lot of cheese: fresca cheese Pizza  Exercise: none  Home BP readings:   Wt Readings from Last 3 Encounters:  06/20/20 190 lb (86.2 kg)  05/25/20 193 lb (87.5 kg)  05/23/20 197 lb (89.4 kg)   BP Readings from Last 3 Encounters:  07/04/20 138/90  07/01/20 136/77  06/20/20 (!) 137/92   Pulse Readings from Last 3 Encounters:  07/04/20 77  07/01/20 86  06/20/20 81    Renal function: CrCl cannot be calculated (Patient's most recent lab result is older than the maximum 21 days allowed.).  Past Medical History:  Diagnosis Date  . Anxiety   . Depression   . GERD (gastroesophageal reflux disease)   . HA (headache)   . Hypertension   . 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 Medical history non-contributory   . Pneumonia    2013    Current Outpatient  Medications on File Prior to Visit  Medication Sig Dispense Refill  . labetalol (NORMODYNE) 300 MG tablet Take 2.5 tablets (750 mg total) by mouth 2 (two) times daily. 450 tablet 3  . NIFEdipine (PROCARDIA XL/NIFEDICAL XL) 60 MG 24 hr tablet Take 1 tablet (60 mg total) by mouth daily. 90 tablet 3  . pantoprazole (PROTONIX) 40 MG tablet Take 1 tablet (40 mg total) by mouth daily. 30 tablet 3  . Prenatal Vit-Fe Fumarate-FA (MULTIVITAMIN-PRENATAL) 27-0.8 MG TABS tablet Take 1 tablet by mouth daily at 12 noon. (Patient not taking: No sig reported) 30 tablet 12  . promethazine (PHENERGAN) 25 MG tablet Take 1 tablet (25 mg total) by mouth every 6 (six) hours as needed for nausea or vomiting. (Patient not taking: Reported on 07/04/2020) 30 tablet 2   No current facility-administered medications on file prior to visit.    No Known Allergies  There were no vitals taken for this visit.   Assessment/Plan:  1. Hypertension - Blood pressure is at goal of 120-160/80-105 per ACOG recommendations in clinic today. I would ideally like patient closer to the 130's/80's- at least consistently <140/90. Will avoid lowering BP <120/80 due to the association with decreased uteroplacental perfusion. During second trimester a dose reduction may  be needed due to physiologic drop in BP. Increase labetalol to 750mg  twice a day and continue nifedipine 60mg  daily. Follow up in clinic in 4 weeks due to pt trip to .   Thank you  , Pharm.D, BCPS, CPP [facility]  [address] [place], [address] [place]  Phone: [phone]; Fax: [phone]

## 2020-08-02 ENCOUNTER — Ambulatory Visit: Payer: MEDICAID

## 2020-08-03 ENCOUNTER — Ambulatory Visit: Payer: MEDICAID | Admitting: Neurology

## 2020-08-03 ENCOUNTER — Telehealth: Payer: Self-pay | Admitting: *Deleted

## 2020-08-03 ENCOUNTER — Encounter: Payer: Self-pay | Admitting: Neurology

## 2020-08-03 NOTE — Telephone Encounter (Signed)
No showed new patient appt 

## 2020-08-07 ENCOUNTER — Encounter: Payer: Self-pay | Admitting: Obstetrics and Gynecology

## 2020-08-07 ENCOUNTER — Other Ambulatory Visit: Payer: Self-pay

## 2020-08-07 ENCOUNTER — Ambulatory Visit (INDEPENDENT_AMBULATORY_CARE_PROVIDER_SITE_OTHER): Payer: Medicaid Other | Admitting: Obstetrics and Gynecology

## 2020-08-07 VITALS — BP 120/78 | HR 68 | Wt 190.0 lb

## 2020-08-07 DIAGNOSIS — F1911 Other psychoactive substance abuse, in remission: Secondary | ICD-10-CM

## 2020-08-07 DIAGNOSIS — O0990 Supervision of high risk pregnancy, unspecified, unspecified trimester: Secondary | ICD-10-CM

## 2020-08-07 DIAGNOSIS — F32A Depression, unspecified: Secondary | ICD-10-CM

## 2020-08-07 DIAGNOSIS — O99340 Other mental disorders complicating pregnancy, unspecified trimester: Secondary | ICD-10-CM

## 2020-08-07 DIAGNOSIS — O10919 Unspecified pre-existing hypertension complicating pregnancy, unspecified trimester: Secondary | ICD-10-CM

## 2020-08-07 MED ORDER — VITAFOL GUMMIES 3.33-0.333-34.8 MG PO CHEW: 2.0000 | CHEWABLE_TABLET | Freq: Every day | ORAL | 6 refills | Status: AC

## 2020-08-07 MED ORDER — ASPIRIN EC 81 MG PO TBEC: 81.0000 mg | DELAYED_RELEASE_TABLET | Freq: Every day | ORAL | 2 refills | Status: DC

## 2020-08-07 NOTE — Progress Notes (Signed)
   PRENATAL VISIT NOTE  Subjective:  Andrea Burns is a 35 y.o. [id number] at [redacted]w[redacted]d being seen today for ongoing prenatal care.  She is currently monitored for the following issues for this high-risk pregnancy and has Depression affecting pregnancy; Acute appendicitis; Chronic hypertension affecting pregnancy; Supervision of high risk pregnancy, antepartum; and History of substance abuse (HCC) on their problem list.  Patient reports no complaints.  Contractions: Irritability. Vag. Bleeding: None.   . Denies leaking of fluid.   The following portions of the patient's history were reviewed and updated as appropriate: allergies, current medications, past family history, past medical history, past social history, past surgical history and problem list.   Objective:   Vitals:   08/07/20 1308  BP: 120/78  Pulse: 68  Weight: 190 lb (86.2 kg)    Fetal Status: Fetal Heart Rate (bpm): 149         General:  Alert, oriented and cooperative. Patient is in no acute distress.  Skin: Skin is warm and dry. No rash noted.   Cardiovascular: Normal heart rate noted  Respiratory: Normal respiratory effort, no problems with respiration noted  Abdomen: Soft, gravid, appropriate for gestational age.  Pain/Pressure: Absent     Pelvic: Cervical exam deferred        Extremities: Normal range of motion.  Edema: Trace  Mental Status: Normal mood and affect. Normal behavior. Normal judgment and thought content.   Assessment and Plan:  Pregnancy: [id number] at [redacted]w[redacted]d 1. Supervision of high risk pregnancy, antepartum Patient is doing well AFP and panorama today Anatomy ultrasound 3/3  2. Chronic hypertension affecting pregnancy Continue labetalol and procardia Patient needs to follow up with cardiologist  3. History of substance abuse (HCC) Denies usage in pregnancy  4. Depression affecting pregnancy Stable  Preterm labor symptoms and general obstetric precautions including but not limited to vaginal  bleeding, contractions, leaking of fluid and fetal movement were reviewed in detail with the patient. Please refer to After Visit Summary for other counseling recommendations.   Return in about 4 weeks (around 09/04/2020) for in person, ROB, High risk.  Future Appointments  Date Time Provider Department Center  08/10/2020  7:30 AM [facility] NURSE [facility]  08/10/2020  7:45 AM [facility]  09/04/2020  9:00 AM Jake Bathe, MD [facility]    Catalina Antigua, MD

## 2020-08-07 NOTE — Progress Notes (Signed)
ROB c/o constipation, she is drinking 8-10 bottles of water/day.

## 2020-08-08 DIAGNOSIS — Z419 Encounter for procedure for purposes other than remedying health state, unspecified: Secondary | ICD-10-CM | POA: Diagnosis not present

## 2020-08-09 LAB — AFP, SERUM, OPEN SPINA BIFIDA
AFP MoM: 0.97
AFP Value: 44.5 ng/mL
Gest. Age on Collection Date: 19.5 weeks
Maternal Age At EDD: 34.4 yr
OSBR Risk 1 IN: 10000
Test Results:: NEGATIVE
Weight: 190 [lb_av]

## 2020-08-10 ENCOUNTER — Encounter: Payer: Self-pay | Admitting: *Deleted

## 2020-08-10 ENCOUNTER — Other Ambulatory Visit: Payer: Self-pay | Admitting: Obstetrics and Gynecology

## 2020-08-10 ENCOUNTER — Ambulatory Visit: Payer: Medicaid Other | Attending: Obstetrics and Gynecology

## 2020-08-10 ENCOUNTER — Other Ambulatory Visit: Payer: Self-pay

## 2020-08-10 ENCOUNTER — Ambulatory Visit: Payer: Medicaid Other | Admitting: *Deleted

## 2020-08-10 DIAGNOSIS — O99212 Obesity complicating pregnancy, second trimester: Secondary | ICD-10-CM | POA: Diagnosis not present

## 2020-08-10 DIAGNOSIS — O10919 Unspecified pre-existing hypertension complicating pregnancy, unspecified trimester: Secondary | ICD-10-CM | POA: Diagnosis not present

## 2020-08-10 DIAGNOSIS — O0990 Supervision of high risk pregnancy, unspecified, unspecified trimester: Secondary | ICD-10-CM | POA: Diagnosis not present

## 2020-08-10 DIAGNOSIS — O10012 Pre-existing essential hypertension complicating pregnancy, second trimester: Secondary | ICD-10-CM

## 2020-08-10 DIAGNOSIS — Z3A20 20 weeks gestation of pregnancy: Secondary | ICD-10-CM

## 2020-08-10 DIAGNOSIS — E669 Obesity, unspecified: Secondary | ICD-10-CM | POA: Diagnosis not present

## 2020-08-10 DIAGNOSIS — Z363 Encounter for antenatal screening for malformations: Secondary | ICD-10-CM

## 2020-08-18 ENCOUNTER — Ambulatory Visit: Payer: Medicaid Other

## 2020-08-21 ENCOUNTER — Telehealth: Payer: Self-pay | Admitting: Pharmacist

## 2020-08-21 MED ORDER — DOCUSATE SODIUM 100 MG PO CAPS: 100.0000 mg | ORAL_CAPSULE | Freq: Two times a day (BID) | ORAL | 2 refills | Status: DC | PRN

## 2020-08-21 NOTE — Telephone Encounter (Signed)
Patient missed her appointment on Friday. Called pt to see how she was doing. States her husband's brother passed and she didn't want to leave him alone. States her blood pressure has been no higher than 140/90's and has been as low as 122/78. She has follow up with Dr. Anne Fu in 2 weeks. Advised for her to keep that apt. Will see her back in clinic after that. States she is having a girl

## 2020-08-24 ENCOUNTER — Ambulatory Visit (HOSPITAL_COMMUNITY)
Admission: EM | Admit: 2020-08-24 | Discharge: 2020-08-24 | Disposition: A | Discharge status: Home / Self Care | Payer: Medicaid Other | Attending: Emergency Medicine | Admitting: Emergency Medicine

## 2020-08-24 ENCOUNTER — Other Ambulatory Visit: Payer: Self-pay

## 2020-08-24 ENCOUNTER — Encounter: Payer: Self-pay | Admitting: *Deleted

## 2020-08-24 ENCOUNTER — Encounter (HOSPITAL_COMMUNITY): Payer: Self-pay | Admitting: Emergency Medicine

## 2020-08-24 DIAGNOSIS — Z7982 Long term (current) use of aspirin: Secondary | ICD-10-CM | POA: Insufficient documentation

## 2020-08-24 DIAGNOSIS — R052 Subacute cough: Secondary | ICD-10-CM | POA: Insufficient documentation

## 2020-08-24 DIAGNOSIS — J069 Acute upper respiratory infection, unspecified: Secondary | ICD-10-CM | POA: Diagnosis not present

## 2020-08-24 DIAGNOSIS — F32A Depression, unspecified: Secondary | ICD-10-CM | POA: Diagnosis not present

## 2020-08-24 DIAGNOSIS — Z20822 Contact with and (suspected) exposure to covid-19: Secondary | ICD-10-CM | POA: Diagnosis not present

## 2020-08-24 DIAGNOSIS — O26892 Other specified pregnancy related conditions, second trimester: Secondary | ICD-10-CM | POA: Insufficient documentation

## 2020-08-24 DIAGNOSIS — F1911 Other psychoactive substance abuse, in remission: Secondary | ICD-10-CM | POA: Insufficient documentation

## 2020-08-24 DIAGNOSIS — Z3A00 Weeks of gestation of pregnancy not specified: Secondary | ICD-10-CM | POA: Insufficient documentation

## 2020-08-24 DIAGNOSIS — O98512 Other viral diseases complicating pregnancy, second trimester: Secondary | ICD-10-CM | POA: Diagnosis not present

## 2020-08-24 DIAGNOSIS — Z87891 Personal history of nicotine dependence: Secondary | ICD-10-CM | POA: Insufficient documentation

## 2020-08-24 DIAGNOSIS — O99340 Other mental disorders complicating pregnancy, unspecified trimester: Secondary | ICD-10-CM | POA: Insufficient documentation

## 2020-08-24 DIAGNOSIS — O10912 Unspecified pre-existing hypertension complicating pregnancy, second trimester: Secondary | ICD-10-CM | POA: Insufficient documentation

## 2020-08-24 DIAGNOSIS — Z79899 Other long term (current) drug therapy: Secondary | ICD-10-CM | POA: Diagnosis not present

## 2020-08-24 DIAGNOSIS — Z760 Encounter for issue of repeat prescription: Secondary | ICD-10-CM | POA: Diagnosis not present

## 2020-08-24 LAB — POC INFLUENZA A AND B ANTIGEN (URGENT CARE ONLY)
Influenza A Ag: NEGATIVE
Influenza B Ag: NEGATIVE

## 2020-08-24 LAB — POCT RAPID STREP A, ED / UC: Streptococcus, Group A Screen (Direct): NEGATIVE

## 2020-08-24 LAB — SARS CORONAVIRUS 2 (TAT 6-24 HRS): SARS Coronavirus 2: NEGATIVE

## 2020-08-24 MED ORDER — ALBUTEROL SULFATE HFA 108 (90 BASE) MCG/ACT IN AERS: 2.0000 | INHALATION_SPRAY | RESPIRATORY_TRACT | 0 refills | Status: DC | PRN

## 2020-08-24 NOTE — ED Provider Notes (Signed)
[facility]    CSN: [id number] Arrival date & time: 08/24/20  1243      History   Chief Complaint Chief Complaint  Patient presents with  . Cough  . Sore Throat  . Nasal Congestion    HPI Andrea Burns is a 35 y.o. female.    Patient reports she is 5 months pregnant.  She presents with 2-week history of headache, nasal congestion, sore throat, cough.  She also reports wheezing.  She denies fever, chills, rash, shortness of breath, vomiting, diarrhea, abdominal pain, or other symptoms.  She states she was seen at another urgent care 2 weeks ago and was treated with an antibiotic; she does not know the name.  Patient states she needs a refill on her albuterol inhaler.  Her medical history includes hypertension, history of substance abuse, depression, anxiety.  The history is provided by the patient and medical records.    Past Medical History:  Diagnosis Date  . Anxiety   . Depression   . GERD (gastroesophageal reflux disease)   . HA (headache)   . Hypertension   . 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 Medical history non-contributory   . Pneumonia    2013    Patient Active Problem List   Diagnosis Date Noted  . History of substance abuse (HCC) 05/23/2020  . Supervision of high risk pregnancy, antepartum 05/19/2020  . Chronic hypertension affecting pregnancy 05/17/2020  . Acute appendicitis   . Depression affecting pregnancy 08/12/2013    Past Surgical History:  Procedure Laterality Date  . APPENDECTOMY    . COLPOSCOPY W/ BIOPSY / CURETTAGE    . IUD REMOVAL    . LAPAROSCOPIC APPENDECTOMY N/A 10/18/2019   Procedure: APPENDECTOMY LAPAROSCOPIC;  Surgeon: [name], MD;  Location: [facility];  Service: General;  Laterality: N/A;  . LAPAROSCOPY ABDOMEN DIAGNOSTIC     Removal of migrated IUD   . NO PAST SURGERIES      OB History    Gravida  6   Para  4   Term  4   Preterm      AB  1   Living  4      SAB  1   IAB      Ectopic      Multiple      Live Births  4            Home Medications    Prior to Admission medications   Medication Sig Start Date End Date Taking? Authorizing Provider  albuterol (VENTOLIN HFA) 108 (90 Base) MCG/ACT inhaler Inhale 2 puffs into the lungs every 4 (four) hours as needed for wheezing or shortness of breath. 08/24/20  Yes Mickie Bail, NP  aspirin EC 81 MG tablet Take 1 tablet (81 mg total) by mouth daily. Take after 12 weeks for prevention of preeclampsia later in pregnancy 08/07/20   Constant, Peggy, MD  docusate sodium (COLACE) 100 MG capsule Take 1 capsule (100 mg total) by mouth 2 (two) times daily as needed. 08/21/20   Constant, Peggy, MD  labetalol (NORMODYNE) 300 MG tablet Take 2.5 tablets (750 mg total) by mouth 2 (two) times daily. 07/04/20   Jake Bathe, MD  NIFEdipine (PROCARDIA XL/NIFEDICAL XL) 60 MG 24 hr tablet Take 1 tablet (60 mg total) by mouth daily. 05/25/20   Jake Bathe, MD  pantoprazole (PROTONIX) 40 MG tablet Take 1 tablet (40 mg total) by mouth daily. Patient  not taking: No sig reported 06/20/20   Constant, Peggy, MD  Prenatal Vit-Fe Fumarate-FA (MULTIVITAMIN-PRENATAL) 27-0.8 MG TABS tablet Take 1 tablet by mouth daily at 12 noon. Patient not taking: No sig reported 05/19/20   Hermina StaggersErvin, Michael L, MD  Prenatal Vit-Fe Phos-FA-Omega (VITAFOL GUMMIES) 3.33-0.333-34.8 MG CHEW Chew 2 tablets by mouth daily. 08/07/20 09/06/20  Constant, Peggy, MD  promethazine (PHENERGAN) 25 MG tablet Take 1 tablet (25 mg total) by mouth every 6 (six) hours as needed for nausea or vomiting. Patient not taking: No sig reported 06/20/20   Constant, Peggy, MD    Family History Family History  Problem Relation Age of Onset  . Diabetes Mother   . Hypertension Mother   . Cancer Mother   . Diabetes Father   . Cancer Paternal Grandmother        liver & lung    Social History Social History   Tobacco Use  . Smoking status: Former Smoker     Packs/day: 0.25    Years: 1.00    Pack years: 0.25    Quit date: 05/03/2005    Years since quitting: 15.3  . Smokeless tobacco: Never Used  Vaping Use  . Vaping Use: Never used  Substance Use Topics  . Alcohol use: Not Currently    Alcohol/week: 0.0 standard drinks  . Drug use: Not Currently    Types: Cocaine    Comment: last used 3 to 4 months ago     Allergies   Patient has no known allergies.   Review of Systems Review of Systems  Constitutional: Negative for chills and fever.  HENT: Positive for congestion and sore throat. Negative for ear pain.   Eyes: Negative for pain and visual disturbance.  Respiratory: Positive for cough and wheezing. Negative for shortness of breath.   Cardiovascular: Negative for chest pain and palpitations.  Gastrointestinal: Negative for abdominal pain, diarrhea and vomiting.  Genitourinary: Negative for dysuria and hematuria.  Musculoskeletal: Negative for arthralgias and back pain.  Skin: Negative for color change and rash.  Neurological: Positive for headaches. Negative for seizures and syncope.  All other systems reviewed and are negative.    Physical Exam Triage Vital Signs ED Triage Vitals  Enc Vitals Group     BP      Pulse      Resp      Temp      Temp src      SpO2      Weight      Height      Head Circumference      Peak Flow      Pain Score      Pain Loc      Pain Edu?      Excl. in GC?    No data found.  Updated Vital Signs BP 122/61 (BP Location: Right Arm)   Pulse 86   Temp 97.9 F (36.6 C) (Oral)   Resp 17   LMP  (LMP Unknown)   SpO2 96%   Visual Acuity Right Eye Distance:   Left Eye Distance:   Bilateral Distance:    Right Eye Near:   Left Eye Near:    Bilateral Near:     Physical Exam Vitals and nursing note reviewed.  Constitutional:      General: She is not in acute distress.    Appearance: She is well-developed. She is not ill-appearing.  HENT:     Head: Normocephalic and atraumatic.      Right Ear:  Tympanic membrane normal.     Left Ear: Tympanic membrane normal.     Nose: Nose normal.     Mouth/Throat:     Mouth: Mucous membranes are moist.     Pharynx: Posterior oropharyngeal erythema present.  Eyes:     Conjunctiva/sclera: Conjunctivae normal.  Cardiovascular:     Rate and Rhythm: Normal rate and regular rhythm.     Heart sounds: Normal heart sounds.  Pulmonary:     Effort: Pulmonary effort is normal. No respiratory distress.     Breath sounds: Normal breath sounds.  Abdominal:     Palpations: Abdomen is soft.     Tenderness: There is no abdominal tenderness.  Musculoskeletal:     Cervical back: Neck supple.  Skin:    General: Skin is warm and dry.     Findings: No rash.  Neurological:     General: No focal deficit present.     Mental Status: She is alert and oriented to person, place, and time.     Gait: Gait normal.  Psychiatric:        Mood and Affect: Mood normal.        Behavior: Behavior normal.      UC Treatments / Results  Labs (all labs ordered are listed, but only abnormal results are displayed) Labs Reviewed  SARS CORONAVIRUS 2 (TAT 6-24 HRS)  CULTURE, GROUP A STREP [facility])  POCT RAPID STREP A, ED / UC  POC INFLUENZA A AND B ANTIGEN (URGENT CARE ONLY)    EKG   Radiology No results found.  Procedures Procedures (including critical care time)  Medications Ordered in UC Medications - No data to display  Initial Impression / Assessment and Plan / UC Course  I have reviewed the triage vital signs and the nursing notes.  Pertinent labs & imaging results that were available during my care of the patient were reviewed by me and considered in my medical decision making (see chart for details).   Viral URI with cough.  Medication refill.  Albuterol inhaler refill provided.  Rapid strep negative; culture pending.  POC influenza negative.  COVID pending.  Instructed patient to self quarantine until the test results are back.   Discussed symptomatic treatment including Tylenol, rest, hydration.  Instructed patient to follow up with PCP or OB/GYN if her symptoms are not improving.  Patient agrees to plan of care.    Final Clinical Impressions(s) / UC Diagnoses   Final diagnoses:  Viral URI with cough  Medication refill     Discharge Instructions     Your rapid strep test is negative.  A throat culture is pending; we will call you if it is positive requiring treatment.    Your flu test is negative.    Your COVID test is pending.  You should self quarantine until the test result is back.    Take Tylenol as needed for fever or discomfort.  Rest and keep yourself hydrated.    Follow-up with your primary care provider or OB/GYN if your symptoms are not improving.    A refill on your albuterol inhaler was called into your pharmacy.  Use it as directed.          ED Prescriptions    Medication Sig Dispense Auth. Provider   albuterol (VENTOLIN HFA) 108 (90 Base) MCG/ACT inhaler Inhale 2 puffs into the lungs every 4 (four) hours as needed for wheezing or shortness of breath. 18 g Mickie Bail, NP     PDMP  not reviewed this encounter.   Mickie Bail, NP 08/24/20 1450

## 2020-08-24 NOTE — Discharge Instructions (Addendum)
Your rapid strep test is negative.  A throat culture is pending; we will call you if it is positive requiring treatment.    Your flu test is negative.    Your COVID test is pending.  You should self quarantine until the test result is back.    Take Tylenol as needed for fever or discomfort.  Rest and keep yourself hydrated.    Follow-up with your primary care provider or OB/GYN if your symptoms are not improving.    A refill on your albuterol inhaler was called into your pharmacy.  Use it as directed.

## 2020-08-24 NOTE — ED Triage Notes (Signed)
Pt states that she has a sore throat, HA, nasal congestion, and a cough. Pt states that she also wheezing. Pt states that her sx started two weeks ago. PT is five months pregnant.

## 2020-08-27 LAB — CULTURE, GROUP A STREP (THRC)

## 2020-09-04 ENCOUNTER — Ambulatory Visit (INDEPENDENT_AMBULATORY_CARE_PROVIDER_SITE_OTHER): Payer: Medicaid Other | Admitting: Cardiology

## 2020-09-04 ENCOUNTER — Other Ambulatory Visit: Payer: Self-pay

## 2020-09-04 ENCOUNTER — Ambulatory Visit (INDEPENDENT_AMBULATORY_CARE_PROVIDER_SITE_OTHER): Payer: Medicaid Other | Admitting: Obstetrics and Gynecology

## 2020-09-04 ENCOUNTER — Encounter: Payer: Self-pay | Admitting: Obstetrics and Gynecology

## 2020-09-04 ENCOUNTER — Encounter: Payer: Self-pay | Admitting: Cardiology

## 2020-09-04 VITALS — BP 129/86 | HR 81 | Wt 194.0 lb

## 2020-09-04 VITALS — BP 120/70 | HR 82 | Ht 64.0 in | Wt 191.0 lb

## 2020-09-04 DIAGNOSIS — O10919 Unspecified pre-existing hypertension complicating pregnancy, unspecified trimester: Secondary | ICD-10-CM

## 2020-09-04 DIAGNOSIS — Z3A34 34 weeks gestation of pregnancy: Secondary | ICD-10-CM

## 2020-09-04 DIAGNOSIS — O0990 Supervision of high risk pregnancy, unspecified, unspecified trimester: Secondary | ICD-10-CM

## 2020-09-04 DIAGNOSIS — O99340 Other mental disorders complicating pregnancy, unspecified trimester: Secondary | ICD-10-CM

## 2020-09-04 DIAGNOSIS — F32A Depression, unspecified: Secondary | ICD-10-CM

## 2020-09-04 LAB — POCT URINALYSIS DIPSTICK
Bilirubin, UA: NEGATIVE
Glucose, UA: NEGATIVE
Ketones, UA: NEGATIVE
Leukocytes, UA: NEGATIVE
Nitrite, UA: NEGATIVE
Protein, UA: POSITIVE — AB
Spec Grav, UA: >=1.03 — AB (ref 1.010–1.025)
Urobilinogen, UA: 0.2 E.U./dL
pH, UA: 6 (ref 5.0–8.0)

## 2020-09-04 NOTE — Progress Notes (Signed)
Pt states she feels like she is wheezing, was recently given inhaler but states not much relief.  Pt is having some stress incontinence with coughing and some pain with urination.

## 2020-09-04 NOTE — Progress Notes (Signed)
Cardiology Office Note:    Date:  09/04/2020   ID:  Kaitlan, Bin Sep 18, 1985, MRN [id number]  PCP:  Patient, No Pcp Per   [facility]  Cardiologist:  Donato Schultz, MD  Advanced Practice Provider:  No care team member to display Electrophysiologist:  None       Referring MD: No ref. provider found     History of Present Illness:    Andrea Burns is a 35 y.o. female here for hypertension follow-up.  Has been seen in our hypertension clinic.  Appreciate their assistance.  In the past she has had depression cocaine use acute appendicitis.  In January was seen for abdominal discomfort.  Approximately 5 months pregnant.  Tested for influenza and Covid negative.  She is having a little girl. Her husband's brother recently died.  Missed a hypertension clinic appointment.  Blood pressures at home have been mostly below 140/90.  Excellent. Past Medical History:  Diagnosis Date  . Anxiety   . Depression   . GERD (gastroesophageal reflux disease)   . HA (headache)   . Hypertension   . 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 Medical history non-contributory   . Pneumonia    2013    Past Surgical History:  Procedure Laterality Date  . APPENDECTOMY    . COLPOSCOPY W/ BIOPSY / CURETTAGE    . IUD REMOVAL    . LAPAROSCOPIC APPENDECTOMY N/A 10/18/2019   Procedure: APPENDECTOMY LAPAROSCOPIC;  Surgeon: [name], MD;  Location: [facility];  Service: General;  Laterality: N/A;  . LAPAROSCOPY ABDOMEN DIAGNOSTIC     Removal of migrated IUD   . NO PAST SURGERIES      Current Medications: Current Meds  Medication Sig  . albuterol (VENTOLIN HFA) 108 (90 Base) MCG/ACT inhaler Inhale 2 puffs into the lungs every 4 (four) hours as needed for wheezing or shortness of breath.  Marland Kitchen aspirin EC 81 MG tablet Take 1 tablet (81 mg total) by mouth daily. Take after 12 weeks for prevention of preeclampsia later in pregnancy  .  docusate sodium (COLACE) 100 MG capsule Take 1 capsule (100 mg total) by mouth 2 (two) times daily as needed.  . labetalol (NORMODYNE) 300 MG tablet Take 2.5 tablets (750 mg total) by mouth 2 (two) times daily.  Marland Kitchen NIFEdipine (PROCARDIA XL/NIFEDICAL XL) 60 MG 24 hr tablet Take 1 tablet (60 mg total) by mouth daily.  . Prenatal Vit-Fe Phos-FA-Omega (VITAFOL GUMMIES) 3.33-0.333-34.8 MG CHEW Chew 2 tablets by mouth daily.     Allergies:   Patient has no known allergies.   Social History   Socioeconomic History  . Marital status: Married    Spouse name: Not on file  . Number of children: 4  . Years of education: Not on file  . Highest education level: Not on file  Occupational History  . Not on file  Tobacco Use  . Smoking status: Former Smoker    Packs/day: 0.25    Years: 1.00    Pack years: 0.25    Quit date: 05/03/2005    Years since quitting: 15.3  . Smokeless tobacco: Never Used  Vaping Use  . Vaping Use: Never used  Substance and Sexual Activity  . Alcohol use: Not Currently    Alcohol/week: 0.0 standard drinks  . Drug use: Not Currently    Types: Cocaine    Comment: last used 3 to 4 months ago  . Sexual activity: Yes  Partners: Male    Birth control/protection: None  Other Topics Concern  . Not on file  Social History Narrative  . Not on file   Social Determinants of Health   Financial Resource Strain: Not on file  Food Insecurity: Not on file  Transportation Needs: Not on file  Physical Activity: Not on file  Stress: Not on file  Social Connections: Not on file     Family History: The patient's family history includes Cancer in her mother and paternal grandmother; Diabetes in her father and mother; Hypertension in her mother.  ROS:   Please see the history of present illness.     All other systems reviewed and are negative.  EKGs/Labs/Other Studies Reviewed:    The following studies were reviewed today:    1. Left ventricular ejection fraction,  by estimation, is 55 to 60%. The  left ventricle has normal function. The left ventricle has no regional  wall motion abnormalities. Left ventricular diastolic parameters were  normal.  2. Right ventricular systolic function is normal. The right ventricular  size is normal.  3. The mitral valve is normal in structure. No evidence of mitral valve  regurgitation.  4. The aortic valve is normal in structure. Aortic valve regurgitation is  not visualized.   Comparison(s): No prior Echocardiogram.   Conclusion(s)/Recommendation(s): Normal biventricular function without  evidence of hemodynamically significant valvular heart disease.    Recent Labs: 05/19/2020: TSH 1.520 07/01/2020: ALT 23; BUN 10; Creatinine, Ser 0.78; Hemoglobin 12.3; Platelets 259; Potassium 3.3; Sodium 136  Recent Lipid Panel No results found for: CHOL, TRIG, HDL, CHOLHDL, VLDL, LDLCALC, LDLDIRECT   Risk Assessment/Calculations:      Physical Exam:    VS:  BP 120/70 (BP Location: Left Arm, Patient Position: Sitting, Cuff Size: Normal)   Pulse 82   Ht 5\' 4"  (1.626 m)   Wt 191 lb (86.6 kg)   LMP  (LMP Unknown)   SpO2 98%   BMI 32.79 kg/m     Wt Readings from Last 3 Encounters:  09/04/20 191 lb (86.6 kg)  08/07/20 190 lb (86.2 kg)  06/20/20 190 lb (86.2 kg)     GEN:  Well nourished, well developed in no acute distress HEENT: Normal NECK: No JVD; No carotid bruits LYMPHATICS: No lymphadenopathy CARDIAC: RRR, no murmurs, rubs, gallops RESPIRATORY:  Clear to auscultation without rales, wheezing or rhonchi  ABDOMEN: Soft, non-tender, non-distended MUSCULOSKELETAL:  No edema; No deformity  SKIN: Warm and dry NEUROLOGIC:  Alert and oriented x 3 PSYCHIATRIC:  Normal affect   ASSESSMENT:    1. Chronic hypertension affecting pregnancy   2. [redacted] weeks gestation of pregnancy    PLAN:    In order of problems listed above:  Essential hypertension -Overall well controlled currently on nifedipine and  labetalol. -Appreciate hypertension clinic  Pregnancy -Overall doing well.  She has been having some wheezing occasionally whistling in her upper throat her throat is a little raspy as well.  I wonder if this is a manifestation of GERD.  May wish to trial heartburn type medication.  Needs to talk with OB about this.  She has tried inhaler without much success.  She is also tried Mucinex as well.   86-month follow-up   Medication Adjustments/Labs and Tests Ordered: Current medicines are reviewed at length with the patient today.  Concerns regarding medicines are outlined above.  No orders of the defined types were placed in this encounter.  No orders of the defined types were placed in  this encounter.   Patient Instructions  Medication Instructions:  Your physician recommends that you continue on your current medications as directed. Please refer to the Current Medication list given to you today. *If you need a refill on your cardiac medications before your next appointment, please call your pharmacy*   Lab Work: None today If you have labs (blood work) drawn today and your tests are completely normal, you will receive your results only by: Marland Kitchen MyChart Message (if you have MyChart) OR . A paper copy in the mail If you have any lab test that is abnormal or we need to change your treatment, we will call you to review the results.   Testing/Procedures: None today   Follow-Up: At [facility], you and your health needs are our priority.  As part of our continuing mission to provide you with exceptional heart care, we have created designated Provider Care Teams.  These Care Teams include your primary Cardiologist (physician) and Advanced Practice Providers (APPs -  Physician Assistants and Nurse Practitioners) who all work together to provide you with the care you need, when you need it.  We recommend signing up for the patient portal called "MyChart".  Sign up information is provided  on this After Visit Summary.  MyChart is used to connect with patients for Virtual Visits (Telemedicine).  Patients are able to view lab/test results, encounter notes, upcoming appointments, etc.  Non-urgent messages can be sent to your provider as well.   To learn more about what you can do with MyChart, go to [email or web address].    Your next appointment:   6 month(s)  The format for your next appointment:   In Person  Provider:   You may see Donato Schultz, MD or one of the following Advanced Practice Providers on your designated Care Team:    Georgie Chard, NP          Signed, Donato Schultz, MD  09/04/2020 9:21 AM    [facility]

## 2020-09-04 NOTE — Progress Notes (Signed)
   PRENATAL VISIT NOTE  Subjective:  Andrea Burns is a 35 y.o. [id number] at [redacted]w[redacted]d being seen today for ongoing prenatal care.  She is currently monitored for the following issues for this high-risk pregnancy and has Depression affecting pregnancy; Acute appendicitis; Chronic hypertension affecting pregnancy; Supervision of high risk pregnancy, antepartum; and History of substance abuse (HCC) on their problem list.  Patient reports no complaints.  Contractions: Not present. Vag. Bleeding: None.  Movement: Present. Denies leaking of fluid.   The following portions of the patient's history were reviewed and updated as appropriate: allergies, current medications, past family history, past medical history, past social history, past surgical history and problem list.   Objective:   Vitals:   09/04/20 1050  BP: 129/86  Pulse: 81  Weight: 194 lb (88 kg)    Fetal Status: Fetal Heart Rate (bpm): 135   Movement: Present     General:  Alert, oriented and cooperative. Patient is in no acute distress.  Skin: Skin is warm and dry. No rash noted.   Cardiovascular: Normal heart rate noted  Respiratory: Normal respiratory effort, no problems with respiration noted  Abdomen: Soft, gravid, appropriate for gestational age.  Pain/Pressure: Absent     Pelvic: Cervical exam deferred        Extremities: Normal range of motion.     Mental Status: Normal mood and affect. Normal behavior. Normal judgment and thought content.   Assessment and Plan:  Pregnancy: [id number] at [redacted]w[redacted]d 1. Supervision of high risk pregnancy, antepartum Patient is doing well. She reports suprapubic pressure- urine culture collected Patient reports persistent constipation- discussed increasing fiber intake as colace is not the only solution Patient reports wheezing despite the use of an inhaler- advised patient to take allergy medications as well Third trimester labs and glucola next visit  2. Chronic hypertension affecting  pregnancy Continue ASA and labetalol Follow up growth ultrasound  3. Depression affecting pregnancy Stable  Preterm labor symptoms and general obstetric precautions including but not limited to vaginal bleeding, contractions, leaking of fluid and fetal movement were reviewed in detail with the patient. Please refer to After Visit Summary for other counseling recommendations.   No follow-ups on file.  Future Appointments  Date Time Provider Department Center  09/07/2020  7:45 AM [facility] NURSE [facility]  09/07/2020  8:00 AM [facility] US1 [facility]  10/06/2020  9:00 AM Levert Feinstein, MD [facility] None  03/08/2021  9:00 AM Jake Bathe, MD [facility]    Catalina Antigua, MD

## 2020-09-04 NOTE — Patient Instructions (Addendum)
Medication Instructions:  Your physician recommends that you continue on your current medications as directed. Please refer to the Current Medication list given to you today. *If you need a refill on your cardiac medications before your next appointment, please call your pharmacy*   Lab Work: None today If you have labs (blood work) drawn today and your tests are completely normal, you will receive your results only by: Marland Kitchen MyChart Message (if you have MyChart) OR . A paper copy in the mail If you have any lab test that is abnormal or we need to change your treatment, we will call you to review the results.   Testing/Procedures: None today   Follow-Up: At [facility], you and your health needs are our priority.  As part of our continuing mission to provide you with exceptional heart care, we have created designated Provider Care Teams.  These Care Teams include your primary Cardiologist (physician) and Advanced Practice Providers (APPs -  Physician Assistants and Nurse Practitioners) who all work together to provide you with the care you need, when you need it.  We recommend signing up for the patient portal called "MyChart".  Sign up information is provided on this After Visit Summary.  MyChart is used to connect with patients for Virtual Visits (Telemedicine).  Patients are able to view lab/test results, encounter notes, upcoming appointments, etc.  Non-urgent messages can be sent to your provider as well.   To learn more about what you can do with MyChart, go to [email or web address].    Your next appointment:   6 month(s)  The format for your next appointment:   In Person  Provider:   You may see Donato Schultz, MD or one of the following Advanced Practice Providers on your designated Care Team:    Georgie Chard, NP

## 2020-09-07 ENCOUNTER — Ambulatory Visit: Payer: Medicaid Other | Admitting: *Deleted

## 2020-09-07 ENCOUNTER — Other Ambulatory Visit: Payer: Self-pay

## 2020-09-07 ENCOUNTER — Other Ambulatory Visit: Payer: Self-pay | Admitting: *Deleted

## 2020-09-07 ENCOUNTER — Ambulatory Visit: Payer: Medicaid Other | Attending: Obstetrics and Gynecology

## 2020-09-07 DIAGNOSIS — O99212 Obesity complicating pregnancy, second trimester: Secondary | ICD-10-CM | POA: Insufficient documentation

## 2020-09-07 DIAGNOSIS — O0990 Supervision of high risk pregnancy, unspecified, unspecified trimester: Secondary | ICD-10-CM

## 2020-09-07 DIAGNOSIS — O10919 Unspecified pre-existing hypertension complicating pregnancy, unspecified trimester: Secondary | ICD-10-CM

## 2020-09-07 DIAGNOSIS — Z3A24 24 weeks gestation of pregnancy: Secondary | ICD-10-CM

## 2020-09-07 DIAGNOSIS — E669 Obesity, unspecified: Secondary | ICD-10-CM | POA: Diagnosis not present

## 2020-09-07 DIAGNOSIS — O10012 Pre-existing essential hypertension complicating pregnancy, second trimester: Secondary | ICD-10-CM | POA: Diagnosis not present

## 2020-09-07 LAB — URINE CULTURE, OB REFLEX

## 2020-09-07 LAB — CULTURE, OB URINE

## 2020-09-08 DIAGNOSIS — Z419 Encounter for procedure for purposes other than remedying health state, unspecified: Secondary | ICD-10-CM | POA: Diagnosis not present

## 2020-10-03 ENCOUNTER — Other Ambulatory Visit: Payer: Self-pay

## 2020-10-03 ENCOUNTER — Encounter: Payer: Self-pay | Admitting: Obstetrics and Gynecology

## 2020-10-03 ENCOUNTER — Ambulatory Visit (INDEPENDENT_AMBULATORY_CARE_PROVIDER_SITE_OTHER): Payer: Medicaid Other | Admitting: Obstetrics and Gynecology

## 2020-10-03 VITALS — BP 135/84 | HR 96 | Wt 198.0 lb

## 2020-10-03 DIAGNOSIS — F32A Depression, unspecified: Secondary | ICD-10-CM

## 2020-10-03 DIAGNOSIS — O99342 Other mental disorders complicating pregnancy, second trimester: Secondary | ICD-10-CM

## 2020-10-03 DIAGNOSIS — O0990 Supervision of high risk pregnancy, unspecified, unspecified trimester: Secondary | ICD-10-CM

## 2020-10-03 DIAGNOSIS — O0992 Supervision of high risk pregnancy, unspecified, second trimester: Secondary | ICD-10-CM

## 2020-10-03 DIAGNOSIS — O10912 Unspecified pre-existing hypertension complicating pregnancy, second trimester: Secondary | ICD-10-CM

## 2020-10-03 DIAGNOSIS — Z23 Encounter for immunization: Secondary | ICD-10-CM

## 2020-10-03 DIAGNOSIS — O10919 Unspecified pre-existing hypertension complicating pregnancy, unspecified trimester: Secondary | ICD-10-CM

## 2020-10-03 DIAGNOSIS — O99340 Other mental disorders complicating pregnancy, unspecified trimester: Secondary | ICD-10-CM

## 2020-10-03 NOTE — Progress Notes (Signed)
+   Fetal movement. No complaints. PHQ 9 Score today 15, GAD Score today 16

## 2020-10-03 NOTE — Patient Instructions (Signed)

## 2020-10-03 NOTE — Progress Notes (Signed)
   PRENATAL VISIT NOTE  Subjective:  Andrea Burns is a 35 y.o. [id number] at [redacted]w[redacted]d being seen today for ongoing prenatal care.  She is currently monitored for the following issues for this high-risk pregnancy and has Depression affecting pregnancy; Acute appendicitis; Chronic hypertension affecting pregnancy; Supervision of high risk pregnancy, antepartum; and History of substance abuse (HCC) on their problem list.  Patient reports no complaints.  Contractions: Not present. Vag. Bleeding: None.  Movement: Present. Denies leaking of fluid.   The following portions of the patient's history were reviewed and updated as appropriate: allergies, current medications, past family history, past medical history, past social history, past surgical history and problem list.   Objective:   Vitals:   10/03/20 0821  BP: 135/84  Pulse: 96  Weight: 198 lb (89.8 kg)    Fetal Status: Fetal Heart Rate (bpm): 137 Fundal Height: 28 cm Movement: Present     General:  Alert, oriented and cooperative. Patient is in no acute distress.  Skin: Skin is warm and dry. No rash noted.   Cardiovascular: Normal heart rate noted  Respiratory: Normal respiratory effort, no problems with respiration noted  Abdomen: Soft, gravid, appropriate for gestational age.  Pain/Pressure: Present     Pelvic: Cervical exam deferred        Extremities: Normal range of motion.  Edema: Trace  Mental Status: Normal mood and affect. Normal behavior. Normal judgment and thought content.   Assessment and Plan:  Pregnancy: [id number] at [redacted]w[redacted]d 1. Supervision of high risk pregnancy, antepartum Patient is doing well without complaints Third trimester labs and glucola today Tdap today Patient plans to use same pediatrician Patient undecided on contraception- information provided  2. Chronic hypertension affecting pregnancy Continue labetalol and ASA Follow up growth ultrasound on 4/28  3. Depression affecting pregnancy Patient admits  to being more stressed and anxious.  She declined meeting with integrated behavioral health She declined medication at this time as well  Preterm labor symptoms and general obstetric precautions including but not limited to vaginal bleeding, contractions, leaking of fluid and fetal movement were reviewed in detail with the patient. Please refer to After Visit Summary for other counseling recommendations.   Return in about 2 weeks (around 10/17/2020) for in person, ROB, High risk.  Future Appointments  Date Time Provider Department Center  10/03/2020 10:30 AM Yael Angerer, Gigi Gin, MD [facility] None  10/05/2020 12:30 PM [facility] NURSE [facility]  10/05/2020 12:45 PM [facility] US5 [facility]  10/06/2020  9:00 AM Levert Feinstein, MD [facility] None  10/19/2020  8:45 AM Zinnia Tindall, Gigi Gin, MD [facility] None  03/08/2021  9:00 AM Jake Bathe, MD [facility]    Catalina Antigua, MD

## 2020-10-04 LAB — CBC
Hematocrit: 32.5 % — ABNORMAL LOW (ref 34.0–46.6)
Hemoglobin: 10.6 g/dL — ABNORMAL LOW (ref 11.1–15.9)
MCH: 28.8 pg (ref 26.6–33.0)
MCHC: 32.6 g/dL (ref 31.5–35.7)
MCV: 88 fL (ref 79–97)
Platelets: 246 10*3/uL (ref 150–450)
RBC: 3.68 x10E6/uL — ABNORMAL LOW (ref 3.77–5.28)
RDW: 12.6 % (ref 11.7–15.4)
WBC: 11.9 10*3/uL — ABNORMAL HIGH (ref 3.4–10.8)

## 2020-10-04 LAB — GLUCOSE TOLERANCE, 2 HOURS W/ 1HR
Glucose, 1 hour: 106 mg/dL (ref 65–179)
Glucose, 2 hour: 106 mg/dL (ref 65–152)
Glucose, Fasting: 76 mg/dL (ref 65–91)

## 2020-10-04 LAB — RPR: RPR Ser Ql: NONREACTIVE

## 2020-10-04 LAB — HIV ANTIBODY (ROUTINE TESTING W REFLEX): HIV Screen 4th Generation wRfx: NONREACTIVE

## 2020-10-05 ENCOUNTER — Encounter: Payer: Self-pay | Admitting: *Deleted

## 2020-10-05 ENCOUNTER — Ambulatory Visit: Payer: Medicaid Other | Admitting: *Deleted

## 2020-10-05 ENCOUNTER — Other Ambulatory Visit: Payer: Self-pay

## 2020-10-05 ENCOUNTER — Ambulatory Visit: Payer: Medicaid Other | Attending: Obstetrics and Gynecology

## 2020-10-05 DIAGNOSIS — E669 Obesity, unspecified: Secondary | ICD-10-CM

## 2020-10-05 DIAGNOSIS — O0990 Supervision of high risk pregnancy, unspecified, unspecified trimester: Secondary | ICD-10-CM | POA: Diagnosis not present

## 2020-10-05 DIAGNOSIS — O10919 Unspecified pre-existing hypertension complicating pregnancy, unspecified trimester: Secondary | ICD-10-CM

## 2020-10-05 DIAGNOSIS — O10013 Pre-existing essential hypertension complicating pregnancy, third trimester: Secondary | ICD-10-CM

## 2020-10-05 DIAGNOSIS — Z3A28 28 weeks gestation of pregnancy: Secondary | ICD-10-CM

## 2020-10-05 DIAGNOSIS — O99213 Obesity complicating pregnancy, third trimester: Secondary | ICD-10-CM

## 2020-10-06 ENCOUNTER — Ambulatory Visit (INDEPENDENT_AMBULATORY_CARE_PROVIDER_SITE_OTHER): Payer: Medicaid Other | Admitting: Neurology

## 2020-10-06 ENCOUNTER — Other Ambulatory Visit: Payer: Self-pay | Admitting: *Deleted

## 2020-10-06 ENCOUNTER — Encounter: Payer: Self-pay | Admitting: Neurology

## 2020-10-06 VITALS — BP 129/85 | HR 79 | Ht 62.0 in | Wt 199.2 lb

## 2020-10-06 DIAGNOSIS — R252 Cramp and spasm: Secondary | ICD-10-CM

## 2020-10-06 DIAGNOSIS — O10919 Unspecified pre-existing hypertension complicating pregnancy, unspecified trimester: Secondary | ICD-10-CM

## 2020-10-06 DIAGNOSIS — G43709 Chronic migraine without aura, not intractable, without status migrainosus: Secondary | ICD-10-CM

## 2020-10-06 MED ORDER — BUTALBITAL-APAP-CAFFEINE 50-325-40 MG PO TABS: 1.0000 | ORAL_TABLET | Freq: Four times a day (QID) | ORAL | 3 refills | Status: DC | PRN

## 2020-10-06 MED ORDER — ONDANSETRON 4 MG PO TBDP: 4.0000 mg | ORAL_TABLET | Freq: Three times a day (TID) | ORAL | 6 refills | Status: DC | PRN

## 2020-10-06 NOTE — Progress Notes (Signed)
Chief Complaint  Patient presents with  . NEW Patient Migraines    Also c/o bilateral leg pain/ jerking of legs,  pt is 7 mo pregnant (5th child).        ASSESSMENT AND PLAN  Andrea Burns is a 35 y.o. female   Chronic migraine headaches Leg muscle cramping, restless leg Worsening depression, memory loss  Patient is currently 7 months pregnant,  Will check iron level, B12 level, to rule out treatable etiology, goal ferritin more than 50,  Fioricet as needed for migraine, may combine it with Zofran  Return to clinic in 2 months,  May consider further evaluation, and treatment postpartum, such as nortriptyline as migraine prevention, sleep, gabapentin for her restless leg symptoms  DIAGNOSTIC DATA (LABS, IMAGING, TESTING) - I reviewed patient records, labs, notes, testing and imaging myself where available.  Laboratory evaluation in 2022: Negative RPR, HIV, glucose tolerance test, CBC showed hemoglobin of 10.6, WBC of 11.9, CMP, creatinine 0.78   HISTORICAL  Andrea Burns is a 35 year old female, seen in request by her obstetrician Dr. Clearance Coots, Leonette Most for evaluation of chronic migraine headaches, frequent leg muscle cramping, pain, initial evaluation was on October 06, 2020   I reviewed and summarized the referring note. PMHX HTN  This is her 5th pregnancy, due date is in July 2022,  She reported lifelong history of migraine, her typical migraine are left retro-orbital area severe pressure headache with associated light, noise, smell sensitivity, it can last up to 2 days, before pregnant she has tried different over-the-counter medications, without helping her migraine  She now complains of migraine 2-3 times each week, she has to take frequent naps, try to avoid too much medications, sleep always helps, headache overall has improved during the pregnancy,  She also complains of frequent bilateral lower extremity discomfort, muscle spasm, she has to pace around, beat on  her calf muscles, or massage to alleviate the symptoms, every other night, sometimes after prolonged sitting,  She also complains of worsening anxiety, get agitated frustrated easily, she tried to keep her self busy to distract her mind, then noticed she has difficulty multitasking, has memory loss, often misplaces her things  REVIEW OF SYSTEMS:  Full 14 system review of systems performed and notable only for as above All other review of systems were negative.  PHYSICAL EXAM:   Vitals:   10/06/20 0849  BP: 129/85  Pulse: 79  Weight: 199 lb 3.2 oz (90.4 kg)  Height: 5\' 2"  (1.575 m)   Not recorded     Body mass index is 36.43 kg/m.  PHYSICAL EXAMNIATION:  Gen: NAD, conversant, well nourised, well groomed                     Cardiovascular: Regular rate rhythm, no peripheral edema, warm, nontender. Eyes: Conjunctivae clear without exudates or hemorrhage Neck: Supple, no carotid bruits. Pulmonary: Clear to auscultation bilaterally   NEUROLOGICAL EXAM:  MENTAL STATUS: Speech:    Speech is normal; fluent and spontaneous with normal comprehension.  Cognition:     Orientation to time, place and person     Normal recent and remote memory     Normal Attention span and concentration     Normal Language, naming, repeating,spontaneous speech     Fund of knowledge   CRANIAL NERVES: CN II: Visual fields are full to confrontation. Pupils are round equal and briskly reactive to light. CN III, IV, VI: extraocular movement are normal. No ptosis. CN V: Facial  sensation is intact to light touch CN VII: Face is symmetric with normal eye closure  CN VIII: Hearing is normal to causal conversation. CN IX, X: Phonation is normal. CN XI: Head turning and shoulder shrug are intact  MOTOR: There is no pronator drift of out-stretched arms. Muscle bulk and tone are normal. Muscle strength is normal.  REFLEXES: Reflexes are 2+ and symmetric at the biceps, triceps, knees, and ankles. Plantar  responses are flexor.  SENSORY: Intact to light touch, pinprick and vibratory sensation are intact in fingers and toes.  COORDINATION: There is no trunk or limb dysmetria noted.  GAIT/STANCE: Posture is normal. Gait is steady with normal steps, base, arm swing, and turning. Heel and toe walking are normal. Tandem gait is normal.  Romberg is absent.  ALLERGIES: No Known Allergies  HOME MEDICATIONS: Current Outpatient Medications  Medication Sig Dispense Refill  . albuterol (VENTOLIN HFA) 108 (90 Base) MCG/ACT inhaler Inhale 2 puffs into the lungs every 4 (four) hours as needed for wheezing or shortness of breath. 18 g 0  . aspirin EC 81 MG tablet Take 1 tablet (81 mg total) by mouth daily. Take after 12 weeks for prevention of preeclampsia later in pregnancy 300 tablet 2  . docusate sodium (COLACE) 100 MG capsule Take 1 capsule (100 mg total) by mouth 2 (two) times daily as needed. 30 capsule 2  . labetalol (NORMODYNE) 300 MG tablet Take 2.5 tablets (750 mg total) by mouth 2 (two) times daily. 450 tablet 3  . NIFEdipine (PROCARDIA XL/NIFEDICAL XL) 60 MG 24 hr tablet Take 1 tablet (60 mg total) by mouth daily. 90 tablet 3  . Prenatal Vit-Fe Fumarate-FA (PRENATAL MULTIVITAMIN) TABS tablet Take 1 tablet by mouth daily at 12 noon.     No current facility-administered medications for this visit.    PAST MEDICAL HISTORY: Past Medical History:  Diagnosis Date  . Anxiety   . Depression   . GERD (gastroesophageal reflux disease)   . HA (headache)   . Hypertension   . 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 Medical history non-contributory   . Pneumonia    2013    PAST SURGICAL HISTORY: Past Surgical History:  Procedure Laterality Date  . APPENDECTOMY    . COLPOSCOPY W/ BIOPSY / CURETTAGE    . IUD REMOVAL    . LAPAROSCOPIC APPENDECTOMY N/A 10/18/2019   Procedure: APPENDECTOMY LAPAROSCOPIC;  Surgeon: [name], MD;  Location: [facility];   Service: General;  Laterality: N/A;  . LAPAROSCOPY ABDOMEN DIAGNOSTIC     Removal of migrated IUD   . NO PAST SURGERIES      FAMILY HISTORY: Family History  Problem Relation Age of Onset  . Diabetes Mother   . Hypertension Mother   . Cancer Mother   . Diabetes Father   . Cancer Paternal Grandmother        liver & lung    SOCIAL HISTORY: Social History   Socioeconomic History  . Marital status: Married    Spouse name: Not on file  . Number of children: 4  . Years of education: Not on file  . Highest education level: Not on file  Occupational History  . Not on file  Tobacco Use  . Smoking status: Former Smoker    Packs/day: 0.25    Years: 1.00    Pack years: 0.25    Quit date: 05/03/2005    Years since quitting: 15.4  . Smokeless tobacco: Never Used  Vaping Use  . Vaping Use: Never used  Substance and Sexual Activity  . Alcohol use: Not Currently    Alcohol/week: 0.0 standard drinks  . Drug use: Not Currently    Types: Cocaine    Comment: last used 3 to 4 months ago  . Sexual activity: Yes    Partners: Male    Birth control/protection: None  Other Topics Concern  . Not on file  Social History Narrative  . Not on file   Social Determinants of Health   Financial Resource Strain: Not on file  Food Insecurity: Not on file  Transportation Needs: Not on file  Physical Activity: Not on file  Stress: Not on file  Social Connections: Not on file  Intimate Partner Violence: Not on file      Levert Feinstein, M.D. Ph.D.  [facility] [address], [address] [place], [place] [address] Ph: [phone] Fax: [phone]  CC:  Brock Bad, MD [address] [place],  [place] [address]  Patient, No Pcp Per (Inactive)

## 2020-10-07 LAB — THYROID PANEL WITH TSH
Free Thyroxine Index: 0.8 — ABNORMAL LOW (ref 1.2–4.9)
T3 Uptake Ratio: 10 % — ABNORMAL LOW (ref 24–39)
T4, Total: 8.4 ug/dL (ref 4.5–12.0)
TSH: 2.02 u[IU]/mL (ref 0.450–4.500)

## 2020-10-07 LAB — IRON AND TIBC
Iron Saturation: 6 % — CL (ref 15–55)
Iron: 28 ug/dL (ref 27–159)
Total Iron Binding Capacity: 433 ug/dL (ref 250–450)
UIBC: 405 ug/dL (ref 131–425)

## 2020-10-07 LAB — FERRITIN: Ferritin: 7 ng/mL — ABNORMAL LOW (ref 15–150)

## 2020-10-07 LAB — VITAMIN B12: Vitamin B-12: 404 pg/mL (ref 232–1245)

## 2020-10-08 DIAGNOSIS — Z419 Encounter for procedure for purposes other than remedying health state, unspecified: Secondary | ICD-10-CM | POA: Diagnosis not present

## 2020-10-10 ENCOUNTER — Telehealth: Payer: Self-pay | Admitting: Neurology

## 2020-10-10 NOTE — Telephone Encounter (Signed)
I spoke to the patient. She verbalized understanding of the findings. Agreeable to start the iron supplement. She will also follow up with her OB-GYN.

## 2020-10-10 NOTE — Telephone Encounter (Signed)
Please call patient, laboratory evaluation shows significantly decreased ferritin level was only 7, she would benefit over-the-counter iron supplement  Evidence of decreased T3 uptake ratio, decreased to Free thyroxine index with normal TSH  I have forwarded laboratory evaluation to her obstetrician Dr. Coral Ceo, she should contact him for further evaluation

## 2020-10-19 ENCOUNTER — Ambulatory Visit (INDEPENDENT_AMBULATORY_CARE_PROVIDER_SITE_OTHER): Payer: Medicaid Other | Admitting: Obstetrics and Gynecology

## 2020-10-19 ENCOUNTER — Other Ambulatory Visit: Payer: Self-pay

## 2020-10-19 ENCOUNTER — Encounter: Payer: Self-pay | Admitting: Obstetrics and Gynecology

## 2020-10-19 VITALS — BP 150/84 | HR 80 | Wt 198.0 lb

## 2020-10-19 DIAGNOSIS — O10919 Unspecified pre-existing hypertension complicating pregnancy, unspecified trimester: Secondary | ICD-10-CM | POA: Diagnosis not present

## 2020-10-19 DIAGNOSIS — O0990 Supervision of high risk pregnancy, unspecified, unspecified trimester: Secondary | ICD-10-CM

## 2020-10-19 MED ORDER — FERROUS SULFATE 325 (65 FE) MG PO TABS: 325.0000 mg | ORAL_TABLET | Freq: Two times a day (BID) | ORAL | 1 refills | Status: DC

## 2020-10-19 NOTE — Progress Notes (Signed)
[facility] wants to discuss treatment plan.

## 2020-10-19 NOTE — Progress Notes (Signed)
   PRENATAL VISIT NOTE  Subjective:  Andrea Burns is a 35 y.o. [id number] at [redacted]w[redacted]d being seen today for ongoing prenatal care.  She is currently monitored for the following issues for this high-risk pregnancy and has Depression affecting pregnancy; Acute appendicitis; Chronic hypertension affecting pregnancy; Supervision of high risk pregnancy, antepartum; History of substance abuse (HCC); Leg cramps; and Chronic migraine w/o aura w/o status migrainosus, not intractable on their problem list.  Patient reports no complaints.  Contractions: Not present. Vag. Bleeding: None.  Movement: Present. Denies leaking of fluid.   The following portions of the patient's history were reviewed and updated as appropriate: allergies, current medications, past family history, past medical history, past social history, past surgical history and problem list.   Objective:   Vitals:   10/19/20 0856  BP: (!) 150/84  Pulse: 80  Weight: 198 lb (89.8 kg)    Fetal Status: Fetal Heart Rate (bpm): 132 Fundal Height: 30 cm Movement: Present     General:  Alert, oriented and cooperative. Patient is in no acute distress.  Skin: Skin is warm and dry. No rash noted.   Cardiovascular: Normal heart rate noted  Respiratory: Normal respiratory effort, no problems with respiration noted  Abdomen: Soft, gravid, appropriate for gestational age.  Pain/Pressure: Present     Pelvic: Cervical exam deferred        Extremities: Normal range of motion.  Edema: Trace  Mental Status: Normal mood and affect. Normal behavior. Normal judgment and thought content.   Assessment and Plan:  Pregnancy: [id number] at [redacted]w[redacted]d 1. Supervision of high risk pregnancy, antepartum Patient is doing well without complaints   2. Chronic hypertension affecting pregnancy Elevated BP today without symptoms.  Will obtain labs Patient did not take morning dose of labetalol yet Follow up growth ultrasound and weekly testing per MFM Patient is  requesting early delivery. Discussed IOL at 39 weeks and sooner only with medical indication  Preterm labor symptoms and general obstetric precautions including but not limited to vaginal bleeding, contractions, leaking of fluid and fetal movement were reviewed in detail with the patient. Please refer to After Visit Summary for other counseling recommendations.   Return in about 2 weeks (around 11/02/2020) for in person, ROB, High risk.  Future Appointments  Date Time Provider Department Center  11/02/2020  8:30 AM [facility] NURSE [facility]  11/02/2020  8:45 AM [facility] US[facility]  11/09/2020  8:30 AM [facility] NURSE [facility]  11/09/2020  8:45 AM [facility] US4 [facility]  12/21/2020 12:45 PM Glean Salvo, NP [facility] None  03/08/2021  9:00 AM Jake Bathe, MD [facility]    Catalina Antigua, MD

## 2020-10-20 LAB — CBC
Hematocrit: 32.7 % — ABNORMAL LOW (ref 34.0–46.6)
Hemoglobin: 10.7 g/dL — ABNORMAL LOW (ref 11.1–15.9)
MCH: 28.7 pg (ref 26.6–33.0)
MCHC: 32.7 g/dL (ref 31.5–35.7)
MCV: 88 fL (ref 79–97)
Platelets: 250 10*3/uL (ref 150–450)
RBC: 3.73 x10E6/uL — ABNORMAL LOW (ref 3.77–5.28)
RDW: 12.8 % (ref 11.7–15.4)
WBC: 10.4 10*3/uL (ref 3.4–10.8)

## 2020-10-20 LAB — COMPREHENSIVE METABOLIC PANEL
ALT: 19 IU/L (ref 0–32)
AST: 16 IU/L (ref 0–40)
Albumin/Globulin Ratio: 1.3 (ref 1.2–2.2)
Albumin: 3.7 g/dL — ABNORMAL LOW (ref 3.8–4.8)
Alkaline Phosphatase: 88 IU/L (ref 44–121)
BUN/Creatinine Ratio: 10 (ref 9–23)
BUN: 7 mg/dL (ref 6–20)
Bilirubin Total: <0.2 mg/dL (ref 0.0–1.2)
CO2: 20 mmol/L (ref 20–29)
Calcium: 9.4 mg/dL (ref 8.7–10.2)
Chloride: 106 mmol/L (ref 96–106)
Creatinine, Ser: 0.73 mg/dL (ref 0.57–1.00)
Globulin, Total: 2.9 g/dL (ref 1.5–4.5)
Glucose: 83 mg/dL (ref 65–99)
Potassium: 4.1 mmol/L (ref 3.5–5.2)
Sodium: 143 mmol/L (ref 134–144)
Total Protein: 6.6 g/dL (ref 6.0–8.5)
eGFR: 111 mL/min/{1.73_m2} (ref >59)

## 2020-10-20 LAB — PROTEIN / CREATININE RATIO, URINE
Creatinine, Urine: 221.3 mg/dL
Protein, Ur: 26.5 mg/dL
Protein/Creat Ratio: 120 mg/g creat (ref 0–200)

## 2020-10-24 ENCOUNTER — Inpatient Hospital Stay (HOSPITAL_COMMUNITY)
Admission: AD | Admit: 2020-10-24 | Discharge: 2020-10-24 | Disposition: A | Discharge status: Home / Self Care | Payer: Medicaid Other | Attending: Obstetrics & Gynecology | Admitting: Obstetrics & Gynecology

## 2020-10-24 ENCOUNTER — Other Ambulatory Visit: Payer: Self-pay

## 2020-10-24 DIAGNOSIS — O2343 Unspecified infection of urinary tract in pregnancy, third trimester: Secondary | ICD-10-CM | POA: Insufficient documentation

## 2020-10-24 DIAGNOSIS — Z87891 Personal history of nicotine dependence: Secondary | ICD-10-CM | POA: Diagnosis not present

## 2020-10-24 DIAGNOSIS — R519 Headache, unspecified: Secondary | ICD-10-CM | POA: Insufficient documentation

## 2020-10-24 DIAGNOSIS — O10913 Unspecified pre-existing hypertension complicating pregnancy, third trimester: Secondary | ICD-10-CM | POA: Insufficient documentation

## 2020-10-24 DIAGNOSIS — Z3689 Encounter for other specified antenatal screening: Secondary | ICD-10-CM

## 2020-10-24 DIAGNOSIS — Z3A30 30 weeks gestation of pregnancy: Secondary | ICD-10-CM | POA: Insufficient documentation

## 2020-10-24 DIAGNOSIS — Z79899 Other long term (current) drug therapy: Secondary | ICD-10-CM | POA: Diagnosis not present

## 2020-10-24 DIAGNOSIS — Z7982 Long term (current) use of aspirin: Secondary | ICD-10-CM | POA: Insufficient documentation

## 2020-10-24 DIAGNOSIS — O10013 Pre-existing essential hypertension complicating pregnancy, third trimester: Secondary | ICD-10-CM | POA: Diagnosis not present

## 2020-10-24 DIAGNOSIS — O26893 Other specified pregnancy related conditions, third trimester: Secondary | ICD-10-CM | POA: Insufficient documentation

## 2020-10-24 DIAGNOSIS — M549 Dorsalgia, unspecified: Secondary | ICD-10-CM | POA: Diagnosis not present

## 2020-10-24 DIAGNOSIS — O10919 Unspecified pre-existing hypertension complicating pregnancy, unspecified trimester: Secondary | ICD-10-CM

## 2020-10-24 LAB — URINALYSIS, ROUTINE W REFLEX MICROSCOPIC
Bilirubin Urine: NEGATIVE
Glucose, UA: NEGATIVE mg/dL
Hgb urine dipstick: NEGATIVE
Ketones, ur: NEGATIVE mg/dL
Nitrite: NEGATIVE
Protein, ur: NEGATIVE mg/dL
Specific Gravity, Urine: 1.015 (ref 1.005–1.030)
pH: 7 (ref 5.0–8.0)

## 2020-10-24 MED ORDER — FAMOTIDINE 20 MG PO TABS
20.0000 mg | ORAL_TABLET | Freq: Every day | ORAL | Status: DC
  Filled 2020-10-24: qty 1

## 2020-10-24 MED ORDER — ACETAMINOPHEN 500 MG PO TABS
1000.0000 mg | ORAL_TABLET | Freq: Once | ORAL | Status: AC
  Administered 2020-10-24: 1000 mg via ORAL
  Filled 2020-10-24: qty 2

## 2020-10-24 MED ORDER — CEFADROXIL 500 MG PO CAPS: 500.0000 mg | ORAL_CAPSULE | Freq: Two times a day (BID) | ORAL | 0 refills | Status: DC

## 2020-10-24 MED ORDER — FAMOTIDINE 20 MG PO TABS
20.0000 mg | ORAL_TABLET | ORAL | Status: AC
  Administered 2020-10-24: 20 mg via ORAL

## 2020-10-24 NOTE — MAU Provider Note (Signed)
Chief Complaint:  Hypertension   Event Date/Time   First Provider Initiated Contact with Patient 10/24/20 0232     HPI: Andrea Burns is a 35 y.o. [id number] at 4934w6d who presents to maternity admissions reporting increased pelvic pressure akin to her labor pains in previous pregnancies. States that in all prior deliveries, she would not feel contractions just increased pelvic pressure until she would feel the urge to push. Feeling similar pressure, no contractions. Also noted dysuria and some back pain (the pain has been an ongoing problem). Reports a headache but no visual disturbances, did not take her procardia today as she misplaced it. Has not taken anything for the headache. Denies vaginal bleeding, leaking of fluid, decreased fetal movement, fever, falls, or recent illness.   Pregnancy Course: Receives care at [facility], prenatal records reviewed.  Past Medical History:  Diagnosis Date  . Anxiety   . Depression   . GERD (gastroesophageal reflux disease)   . HA (headache)   . Hypertension   . 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 Medical history non-contributory   . Pneumonia    2013   OB History  Gravida Para Term Preterm AB Living  6 4 4   1 4   SAB IAB Ectopic Multiple Live Births  1       4    # Outcome Date GA Lbr Len/2nd Weight Sex Delivery Anes PTL Lv  6 Current           5 Term 05/10/13 5758w3d 35:30 / 00:36 6 lb 15.3 oz (3.155 kg) M Vag-Spont Local  LIV  4 Term 04/23/09 2460w0d  8 lb (3.629 kg) M Vag-Spont None  LIV     Birth Comments: No complications  3 Term 02/28/07 7360w0d  7 lb 5 oz (3.317 kg) M Vag-Spont None  LIV     Birth Comments: No complications  2 Term 05/16/04 8149w0d  7 lb 11 oz (3.487 kg) F Vag-Spont None  LIV     Birth Comments: No complications  1 SAB            Past Surgical History:  Procedure Laterality Date  . APPENDECTOMY    . COLPOSCOPY W/ BIOPSY / CURETTAGE    . IUD REMOVAL    . LAPAROSCOPIC APPENDECTOMY  N/A 10/18/2019   Procedure: APPENDECTOMY LAPAROSCOPIC;  Surgeon: Lucretia RoersBridges, Lindsay C, MD;  Location: [facility];  Service: General;  Laterality: N/A;  . LAPAROSCOPY ABDOMEN DIAGNOSTIC     Removal of migrated IUD   . NO PAST SURGERIES     Family History  Problem Relation Age of Onset  . Diabetes Mother   . Hypertension Mother   . Cancer Mother   . Diabetes Father   . Cancer Paternal Grandmother        liver & lung   Social History   Tobacco Use  . Smoking status: Former Smoker    Packs/day: 0.25    Years: 1.00    Pack years: 0.25    Quit date: 05/03/2005    Years since quitting: 15.4  . Smokeless tobacco: Never Used  Vaping Use  . Vaping Use: Never used  Substance Use Topics  . Alcohol use: Not Currently    Alcohol/week: 0.0 standard drinks  . Drug use: Not Currently    Types: Cocaine    Comment: last used 3 to 4 months ago   No Known Allergies Medications Prior to Admission  Medication Sig Dispense Refill Last Dose  .  aspirin EC 81 MG tablet Take 1 tablet (81 mg total) by mouth daily. Take after 12 weeks for prevention of preeclampsia later in pregnancy 300 tablet 2 10/24/2020 at Unknown time  . Ferrous Sulfate (IRON PO) Take 1 tablet by mouth daily.   10/24/2020 at Unknown time  . labetalol (NORMODYNE) 300 MG tablet Take 2.5 tablets (750 mg total) by mouth 2 (two) times daily. 450 tablet 3 10/24/2020 at Unknown time  . NIFEdipine (PROCARDIA XL/NIFEDICAL XL) 60 MG 24 hr tablet Take 1 tablet (60 mg total) by mouth daily. 90 tablet 3 Past Week at Unknown time  . Prenatal Vit-Fe Fumarate-FA (PRENATAL MULTIVITAMIN) TABS tablet Take 1 tablet by mouth daily at 12 noon.   10/24/2020 at Unknown time  . albuterol (VENTOLIN HFA) 108 (90 Base) MCG/ACT inhaler Inhale 2 puffs into the lungs every 4 (four) hours as needed for wheezing or shortness of breath. 18 g 0   . butalbital-acetaminophen-caffeine (FIORICET) 50-325-40 MG tablet Take 1 tablet by mouth every 6 (six) hours as needed for  headache. 10 tablet 3   . docusate sodium (COLACE) 100 MG capsule Take 1 capsule (100 mg total) by mouth 2 (two) times daily as needed. 30 capsule 2   . ferrous sulfate (FERROUSUL) 325 (65 FE) MG tablet Take 1 tablet (325 mg total) by mouth 2 (two) times daily. 60 tablet 1   . ondansetron (ZOFRAN ODT) 4 MG disintegrating tablet Take 1 tablet (4 mg total) by mouth every 8 (eight) hours as needed. 20 tablet 6    I have reviewed patient's Past Medical Hx, Surgical Hx, Family Hx, Social Hx, medications and allergies.   ROS:  Review of Systems  Constitutional: Negative for fatigue and fever.  HENT: Negative for congestion and sore throat.   Eyes: Negative for visual disturbance.  Respiratory: Negative for cough, chest tightness and shortness of breath.   Cardiovascular: Negative for chest pain.  Gastrointestinal: Negative for abdominal pain, nausea and vomiting.  Genitourinary: Positive for dysuria, pelvic pain (pressure not pain) and urgency. Negative for flank pain.  Musculoskeletal: Positive for back pain (low back pain).  Neurological: Positive for headaches. Negative for dizziness, syncope and light-headedness.   Physical Exam   Patient Vitals for the past 24 hrs:  BP Temp Pulse Resp SpO2 Height Weight  10/24/20 0152 -- -- -- -- 97 % -- --  10/24/20 0113 139/75 97.8 F (36.6 C) 77 18 -- 5\' 2"  (1.575 m) 202 lb (91.6 kg)   Constitutional: Well-developed, well-nourished female in no acute distress.  Cardiovascular: normal rate & rhythm, no murmur Respiratory: normal effort, lung sounds clear throughout GI: Abd soft, non-tender, gravid appropriate for gestational age. Pos BS x 4 MS: Extremities nontender, no edema, normal ROM Neurologic: Alert and oriented x 4.  GU: no CVA tenderness Pelvic: NEFG, physiologic discharge, no blood - pt noted pain with cervical exam when pressure applied to anterior wall of vagina (bladder tenderness)  Dilation: 1 Effacement (%): Thick Cervical  Position: Posterior Exam by:: [email or web address], CNM  Fetal Tracing: reactive Baseline: 135 Variability: 15x15 Accelerations: moderate Decelerations: none Toco: relaxed   Labs: Results for orders placed or performed during the hospital encounter of 10/24/20 (from the past 24 hour(s))  Urinalysis, Routine w reflex microscopic     Status: Abnormal   Collection Time: 10/24/20  1:55 AM  Result Value Ref Range   Color, Urine YELLOW YELLOW   APPearance HAZY (A) CLEAR   Specific Gravity, Urine 1.015 1.005 - 1.030  pH 7.0 5.0 - 8.0   Glucose, UA NEGATIVE NEGATIVE mg/dL   Hgb urine dipstick NEGATIVE NEGATIVE   Bilirubin Urine NEGATIVE NEGATIVE   Ketones, ur NEGATIVE NEGATIVE mg/dL   Protein, ur NEGATIVE NEGATIVE mg/dL   Nitrite NEGATIVE NEGATIVE   Leukocytes,Ua MODERATE (A) NEGATIVE   RBC / HPF 0-5 0 - 5 RBC/hpf   WBC, UA 6-10 0 - 5 WBC/hpf   Bacteria, UA RARE (A) NONE SEEN   Squamous Epithelial / LPF 21-50 0 - 5   Mucus PRESENT    Imaging:  No results found.  MAU Course: Orders Placed This Encounter  Procedures  . Culture, OB Urine  . Urinalysis, Routine w reflex microscopic  . Discharge patient   Meds ordered this encounter  Medications  . DISCONTD: famotidine (PEPCID) tablet 20 mg  . acetaminophen (TYLENOL) tablet 1,000 mg  . famotidine (PEPCID) tablet 20 mg  . cefadroxil (DURICEF) 500 MG capsule    Sig: Take 1 capsule (500 mg total) by mouth 2 (two) times daily.    Dispense:  14 capsule    Refill:  0    Order Specific Question:   Supervising Provider    Answer:   Samara Snide   MDM: Headache relieved from 8/10 to 3/10. BP remained elevated but not severe range.  Cervix 1cm externally but a fingertip internally, but posterior and thick  Given symptoms, will treat empirically for UTI  Encouraged patient to make sure she takes BP meds as scheduled   Assessment: 1. UTI (urinary tract infection) during pregnancy, third trimester   2. Chronic hypertension  affecting pregnancy   3. NST (non-stress test) reactive    Plan: Discharge home in stable condition with preterm labor and preeclampsia precautions mod   Follow-up Information    [facility]. Go to.   Specialty: Obstetrics and Gynecology Why: as scheduled for ongoing prenatal care Contact information: [address] [place] [address] [phone]              Allergies as of 10/24/2020   No Known Allergies     Medication List    TAKE these medications   albuterol 108 (90 Base) MCG/ACT inhaler Commonly known as: VENTOLIN HFA Inhale 2 puffs into the lungs every 4 (four) hours as needed for wheezing or shortness of breath.   aspirin EC 81 MG tablet Take 1 tablet (81 mg total) by mouth daily. Take after 12 weeks for prevention of preeclampsia later in pregnancy   butalbital-acetaminophen-caffeine 50-325-40 MG tablet Commonly known as: FIORICET Take 1 tablet by mouth every 6 (six) hours as needed for headache.   cefadroxil 500 MG capsule Commonly known as: DURICEF Take 1 capsule (500 mg total) by mouth 2 (two) times daily.   docusate sodium 100 MG capsule Commonly known as: COLACE Take 1 capsule (100 mg total) by mouth 2 (two) times daily as needed.   ferrous sulfate 325 (65 FE) MG tablet Commonly known as: FerrouSul Take 1 tablet (325 mg total) by mouth 2 (two) times daily.   IRON PO Take 1 tablet by mouth daily.   labetalol 300 MG tablet Commonly known as: NORMODYNE Take 2.5 tablets (750 mg total) by mouth 2 (two) times daily.   NIFEdipine 60 MG 24 hr tablet Commonly known as: PROCARDIA XL/NIFEDICAL XL Take 1 tablet (60 mg total) by mouth daily.   ondansetron 4 MG disintegrating tablet Commonly known as: Zofran ODT Take 1 tablet (4  mg total) by mouth every 8 (eight) hours as needed.   prenatal multivitamin Tabs tablet Take 1 tablet by mouth daily at 12 noon.      Edd Arbour, CNM, MSN,  IBCLC Certified Nurse Midwife, [facility]

## 2020-10-24 NOTE — Discharge Instructions (Signed)
Pregnancy and Urinary Tract Infection  A urinary tract infection (UTI) is an infection of any part of the urinary tract. This includes the kidneys, the tubes that connect your kidneys to your bladder (ureters), the bladder, and the tube that carries urine out of your body (urethra). These organs make, store, and get rid of urine in the body. Your health care provider may use other names to describe the infection. An upper UTI affects the ureters and kidneys (pyelonephritis). A lower UTI affects the bladder (cystitis) and urethra (urethritis). Most urinary tract infections are caused by bacteria in your genital area, around the entrance to your urinary tract (urethra). These bacteria grow and cause irritation and inflammation of your urinary tract. You are more likely to develop a UTI during pregnancy because the physical and hormonal changes your body goes through can make it easier for bacteria to get into your urinary tract. Your growing baby also puts pressure on your bladder and can affect urine flow. It is important to recognize and treat UTIs in pregnancy because of the risk of serious complications for both you and your baby. How does this affect me? Symptoms of a UTI include:  Needing to urinate right away (urgently).  Frequent urination or passing small amounts of urine frequently.  Pain or burning with urination.  Blood in the urine.  Urine that smells bad or unusual.  Trouble urinating.  Cloudy urine.  Pain in the abdomen or lower back.  Vaginal discharge. You may also have:  Vomiting or a decreased appetite.  Confusion.  Irritability or tiredness.  A fever.  Diarrhea. How does this affect my baby? An untreated UTI during pregnancy could lead to a kidney infection or a systemic infection, which can cause health problems that could affect your baby. Possible complications of an untreated UTI include:  Giving birth to your baby before 37 weeks of pregnancy  (premature).  Having a baby with a low birth weight.  Developing high blood pressure during pregnancy (preeclampsia).  Having a low hemoglobin level (anemia). What can I do to lower my risk? To prevent a UTI:  Go to the bathroom as soon as you feel the need. Do not hold urine for long periods of time.  Always wipe from front to back, especially after a bowel movement. Use each tissue one time when you wipe.  Empty your bladder after sex.  Keep your genital area dry.  Drink 6-10 glasses of water each day.  Do not douche or use deodorant sprays. How is this treated? Treatment for this condition may include:  Antibiotic medicines that are safe to take during pregnancy.  Other medicines to treat less common causes of UTI. Follow these instructions at home:  If you were prescribed an antibiotic medicine, take it as told by your health care provider. Do not stop using the antibiotic even if you start to feel better.  Keep all follow-up visits as told by your health care provider. This is important. Contact a health care provider if:  Your symptoms do not improve or they get worse.  You have abnormal vaginal discharge. Get help right away if you:  Have a fever.  Have nausea and vomiting.  Have back or side pain.  Feel contractions in your uterus.  Have lower belly pain.  Have a gush of fluid from your vagina.  Have blood in your urine. Summary  A urinary tract infection (UTI) is an infection of any part of the urinary tract, which includes the   kidneys, ureters, bladder, and urethra.  Most urinary tract infections are caused by bacteria in your genital area, around the entrance to your urinary tract (urethra).  You are more likely to develop a UTI during pregnancy.  If you were prescribed an antibiotic medicine, take it as told by your health care provider. Do not stop using the antibiotic even if you start to feel better. This information is not intended to  replace advice given to you by your health care provider. Make sure you discuss any questions you have with your health care provider. Document Revised: 09/18/2018 Document Reviewed: 04/30/2018 Elsevier Patient Education  2021 Elsevier Inc.   Perinatal Depression When a woman feels excessive sadness, anger, or anxiety during pregnancy or during the first 12 months after she gives birth, she has a condition called perinatal depression. This can interfere with work, school, relationships, and other everyday activities. If it is not managed properly, it can also interfere with the woman's ability to take care of the baby. Symptoms of perinatal depression may feel worse when living with a newborn. Sometimes, these symptoms are left untreated because they are thought to be normal mood swings during and right after pregnancy. However, if you have intense symptoms of depression that last for more than 2 weeks, it is important to talk with your health care provider. This may be perinatal depression. What are the causes? The exact cause of this condition is not known. Hormonal changes during and after pregnancy may play a role in causing perinatal depression. What increases the risk? You are more likely to develop this condition if:  You have a personal or family history of depression, anxiety, or mood disorders.  You experience a stressful life event during pregnancy, such as the death of a loved one.  You have additional life stress, such as being a single parent.  You do not have support from family members or loved ones, or you are in an abusive relationship.  You have thyroid problems. What are the signs or symptoms? Symptoms of this condition include:  Emotional symptoms, such as: ? Feeling sad or hopeless. ? Feelings of guilt. ? Feeling irritable or overwhelmed.  Physical symptoms, such as: ? Changes with appetite or sleep. ? Lack of energy or motivation. ? Persistent headaches or  stomach problems.  Behavioral symptoms, such as: ? Difficulty concentrating or completing tasks. ? Loss of interest in hobbies or relationships. How is this diagnosed? This condition is diagnosed based on a physical exam and mental evaluation.  In some cases, your health care provider may use a depression screening tool. This includes a list of questions that can help a health care provider diagnose depression.  You may be referred to a mental health expert who specializes in treating perinatal depression. How is this treated? This condition may be treated with:  Talk therapy with a mental health professional. This may be interpersonal psychotherapy, couples therapy, cognitive behavioral therapy, or mother-child bonding therapy.  Medicines. Your health care provider will discuss the safety of the medicines prescribed during pregnancy and breastfeeding.  Support groups.  Brain stimulation or light therapies.  Stress reduction therapies, such as mindfulness.   Follow these instructions at home: Lifestyle  Do not use any products that contain nicotine or tobacco. These products include cigarettes, chewing tobacco, and vaping devices, such as e-cigarettes. If you need help quitting, ask your health care provider.  Do not drink alcohol when you are pregnant. It is also safest not to drink alcohol  if you are breastfeeding.  After your baby is born, if you drink alcohol: ? Limit how much you have to 0-1 drink a day. ? Be aware of how much alcohol is in your drink. In the [place] one drink equals one 12 oz bottle of beer (355 mL), one 5 oz glass of wine (148 mL), or one 1 oz glass of hard liquor (44 mL).  Consider joining a support group for new mothers. Ask your health care provider for recommendations.  Take good care of yourself. Make sure you: ? Get as much sleep as possible. Talk with your partner about sharing the responsibility of getting up with your baby if possible and sharing  child care responsibilities equally. Make sleep a priority. ? Eat a healthy diet. This includes plenty of fruits and vegetables, whole grains, and lean proteins. ? Exercise regularly, as told by your health care provider. Ask your health care provider what exercises are safe for you. Talk with your partner about making sure you both have opportunities to exercise. General instructions  Take over-the-counter and prescription medicines only as told by your health care provider.  Talk with your partner or family members about your feelings during pregnancy. Share any concerns, needs, or anxieties that you may have. Do not be afraid to ask for help. Find a mental health professional, if needed.  Ask for help with tasks or chores when you need it. Ask friends and family members to provide meals, watch your children, or help with cleaning.  Keep all follow-up visits. This is important. Contact a health care provider if:  You or people close to you notice that you have symptoms of depression.  Your symptoms of depression get worse.  You take medicines and have side effects, such as nausea or sleep problems. Get help right away if:  You feel like hurting yourself, your baby, or someone else. If you feel like you may hurt yourself or others, or have thoughts about taking your own life, get help right away. You can go to your nearest emergency department or:  Call your local emergency services (911 in the [place]  Call a suicide crisis helpline, such as the National Suicide Prevention Lifeline, at 650-774-2660. This is open 24 hours a day in the [place]  Text the Crisis Text Line at 240-644-4539 (in the [place] Summary  Perinatal depression is when a woman feels excessive sadness, anger, or anxiety during pregnancy or during the first 12 months after she gives birth.  If perinatal depression is not managed properly, it can interfere with the woman's ability to take care of the baby.  This condition  is treated with medicines, talk therapy, stress reduction therapies, or a combination of treatments.  Talk with your partner or family members about your feelings. Ask for help when you need it. This information is not intended to replace advice given to you by your health care provider. Make sure you discuss any questions you have with your health care provider. Document Revised: 11/19/2019 Document Reviewed: 11/19/2019 Elsevier Patient Education  2021 [employer].

## 2020-10-24 NOTE — MAU Note (Signed)
Pt stated she has has some anxiety tonight and stated her b/p  Tonight and it was elevated 154/94 and then 160/112. C/O headache and seeing stars. Good fetal movement felt.  Took her labetalol today but could not find her procardia to take.  C/O increased pelvic pressure. Stated she has had the urge to urinate but not much comes out. No pain or burning .

## 2020-10-25 LAB — CULTURE, OB URINE

## 2020-11-02 ENCOUNTER — Ambulatory Visit (INDEPENDENT_AMBULATORY_CARE_PROVIDER_SITE_OTHER): Payer: Medicaid Other | Admitting: Family Medicine

## 2020-11-02 ENCOUNTER — Other Ambulatory Visit: Payer: Self-pay

## 2020-11-02 ENCOUNTER — Ambulatory Visit: Payer: Medicaid Other | Admitting: *Deleted

## 2020-11-02 ENCOUNTER — Encounter: Payer: Self-pay | Admitting: *Deleted

## 2020-11-02 ENCOUNTER — Ambulatory Visit: Payer: Medicaid Other | Attending: Obstetrics

## 2020-11-02 ENCOUNTER — Other Ambulatory Visit: Payer: Self-pay | Admitting: Obstetrics

## 2020-11-02 DIAGNOSIS — O10013 Pre-existing essential hypertension complicating pregnancy, third trimester: Secondary | ICD-10-CM | POA: Diagnosis not present

## 2020-11-02 DIAGNOSIS — O99213 Obesity complicating pregnancy, third trimester: Secondary | ICD-10-CM | POA: Diagnosis not present

## 2020-11-02 DIAGNOSIS — E669 Obesity, unspecified: Secondary | ICD-10-CM | POA: Diagnosis not present

## 2020-11-02 DIAGNOSIS — O0990 Supervision of high risk pregnancy, unspecified, unspecified trimester: Secondary | ICD-10-CM

## 2020-11-02 DIAGNOSIS — O10919 Unspecified pre-existing hypertension complicating pregnancy, unspecified trimester: Secondary | ICD-10-CM

## 2020-11-02 DIAGNOSIS — Z3A32 32 weeks gestation of pregnancy: Secondary | ICD-10-CM | POA: Diagnosis not present

## 2020-11-02 DIAGNOSIS — O99212 Obesity complicating pregnancy, second trimester: Secondary | ICD-10-CM

## 2020-11-02 MED ORDER — FLUCONAZOLE 150 MG PO TABS: 150.0000 mg | ORAL_TABLET | Freq: Once | ORAL | 0 refills | Status: AC

## 2020-11-02 NOTE — Progress Notes (Signed)
   Subjective:  Andrea Burns is a 35 y.o. [id number] at [redacted]w[redacted]d being seen today for ongoing prenatal care.  She is currently monitored for the following issues for this high-risk pregnancy and has Depression affecting pregnancy; Acute appendicitis; Chronic hypertension affecting pregnancy; Supervision of high risk pregnancy, antepartum; History of substance abuse (HCC); Leg cramps; and Chronic migraine w/o aura w/o status migrainosus, not intractable on their problem list.  Patient reports vaginal irritation.  Contractions: Irritability. Vag. Bleeding: None.  Movement: Present. Denies leaking of fluid.   The following portions of the patient's history were reviewed and updated as appropriate: allergies, current medications, past family history, past medical history, past social history, past surgical history and problem list. Problem list updated.  Objective:   Vitals:   11/02/20 1029  BP: 121/81  Pulse: (!) 101  Weight: 203 lb (92.1 kg)    Fetal Status: Fetal Heart Rate (bpm): 132   Movement: Present     General:  Alert, oriented and cooperative. Patient is in no acute distress.  Skin: Skin is warm and dry. No rash noted.   Cardiovascular: Normal heart rate noted  Respiratory: Normal respiratory effort, no problems with respiration noted  Abdomen: Soft, gravid, appropriate for gestational age. Pain/Pressure: Present     Pelvic: Vag. Bleeding: None     Cervical exam deferred        Extremities: Normal range of motion.  Edema: Trace  Mental Status: Normal mood and affect. Normal behavior. Normal judgment and thought content.   Urinalysis:      Assessment and Plan:  Pregnancy: [id number] at [redacted]w[redacted]d  1. Supervision of high risk pregnancy, antepartum BP and FHR normal Reports yeast infection after tx for UTI, rx for fluconazole sent Discussed BTL, she is still undecided but will sign papers today as she is already 32 weeks. Cont to discuss at next visit  2. Chronic hypertension  affecting pregnancy On labetalol and nifedipine BP excellent today Per MFM report from today if uncontrolled BP's delivery at around 37 weeks, does not appear to be the case right now Continue weekly antenatal testing  Preterm labor symptoms and general obstetric precautions including but not limited to vaginal bleeding, contractions, leaking of fluid and fetal movement were reviewed in detail with the patient. Please refer to After Visit Summary for other counseling recommendations.  Return in 2 weeks (on 11/16/2020) for ob visit, [facility], needs MD.   Venora Maples, MD

## 2020-11-02 NOTE — Patient Instructions (Signed)
 Contraception Choices Contraception, also called birth control, refers to methods or devices that prevent pregnancy. Hormonal methods Contraceptive implant A contraceptive implant is a thin, plastic tube that contains a hormone that prevents pregnancy. It is different from an intrauterine device (IUD). It is inserted into the upper part of the arm by a health care provider. Implants can be effective for up to 3 years. Progestin-only injections Progestin-only injections are injections of progestin, a synthetic form of the hormone progesterone. They are given every 3 months by a health care provider. Birth control pills Birth control pills are pills that contain hormones that prevent pregnancy. They must be taken once a day, preferably at the same time each day. A prescription is needed to use this method of contraception. Birth control patch The birth control patch contains hormones that prevent pregnancy. It is placed on the skin and must be changed once a week for three weeks and removed on the fourth week. A prescription is needed to use this method of contraception. Vaginal ring A vaginal ring contains hormones that prevent pregnancy. It is placed in the vagina for three weeks and removed on the fourth week. After that, the process is repeated with a new ring. A prescription is needed to use this method of contraception. Emergency contraceptive Emergency contraceptives prevent pregnancy after unprotected sex. They come in pill form and can be taken up to 5 days after sex. They work best the sooner they are taken after having sex. Most emergency contraceptives are available without a prescription. This method should not be used as your only form of birth control.   Barrier methods Female condom A female condom is a thin sheath that is worn over the penis during sex. Condoms keep sperm from going inside a woman's body. They can be used with a sperm-killing substance (spermicide) to increase their  effectiveness. They should be thrown away after one use. Female condom A female condom is a soft, loose-fitting sheath that is put into the vagina before sex. The condom keeps sperm from going inside a woman's body. They should be thrown away after one use. Diaphragm A diaphragm is a soft, dome-shaped barrier. It is inserted into the vagina before sex, along with a spermicide. The diaphragm blocks sperm from entering the uterus, and the spermicide kills sperm. A diaphragm should be left in the vagina for 6-8 hours after sex and removed within 24 hours. A diaphragm is prescribed and fitted by a health care provider. A diaphragm should be replaced every 1-2 years, after giving birth, after gaining more than 15 lb (6.8 kg), and after pelvic surgery. Cervical cap A cervical cap is a round, soft latex or plastic cup that fits over the cervix. It is inserted into the vagina before sex, along with spermicide. It blocks sperm from entering the uterus. The cap should be left in place for 6-8 hours after sex and removed within 48 hours. A cervical cap must be prescribed and fitted by a health care provider. It should be replaced every 2 years. Sponge A sponge is a soft, circular piece of polyurethane foam with spermicide in it. The sponge helps block sperm from entering the uterus, and the spermicide kills sperm. To use it, you make it wet and then insert it into the vagina. It should be inserted before sex, left in for at least 6 hours after sex, and removed and thrown away within 30 hours. Spermicides Spermicides are chemicals that kill or block sperm from entering the   cervix and uterus. They can come as a cream, jelly, suppository, foam, or tablet. A spermicide should be inserted into the vagina with an applicator at least 10-15 minutes before sex to allow time for it to work. The process must be repeated every time you have sex. Spermicides do not require a prescription.   Intrauterine  contraception Intrauterine device (IUD) An IUD is a T-shaped device that is put in a woman's uterus. There are two types:  Hormone IUD.This type contains progestin, a synthetic form of the hormone progesterone. This type can stay in place for 3-5 years.  Copper IUD.This type is wrapped in copper wire. It can stay in place for 10 years. Permanent methods of contraception Female tubal ligation In this method, a woman's fallopian tubes are sealed, tied, or blocked during surgery to prevent eggs from traveling to the uterus. Hysteroscopic sterilization In this method, a small, flexible insert is placed into each fallopian tube. The inserts cause scar tissue to form in the fallopian tubes and block them, so sperm cannot reach an egg. The procedure takes about 3 months to be effective. Another form of birth control must be used during those 3 months. Female sterilization This is a procedure to tie off the tubes that carry sperm (vasectomy). After the procedure, the man can still ejaculate fluid (semen). Another form of birth control must be used for 3 months after the procedure. Natural planning methods Natural family planning In this method, a couple does not have sex on days when the woman could become pregnant. Calendar method In this method, the woman keeps track of the length of each menstrual cycle, identifies the days when pregnancy can happen, and does not have sex on those days. Ovulation method In this method, a couple avoids sex during ovulation. Symptothermal method This method involves not having sex during ovulation. The woman typically checks for ovulation by watching changes in her temperature and in the consistency of cervical mucus. Post-ovulation method In this method, a couple waits to have sex until after ovulation. Where to find more information  Centers for Disease Control and Prevention: www.cdc.gov Summary  Contraception, also called birth control, refers to methods or  devices that prevent pregnancy.  Hormonal methods of contraception include implants, injections, pills, patches, vaginal rings, and emergency contraceptives.  Barrier methods of contraception can include female condoms, female condoms, diaphragms, cervical caps, sponges, and spermicides.  There are two types of IUDs (intrauterine devices). An IUD can be put in a woman's uterus to prevent pregnancy for 3-5 years.  Permanent sterilization can be done through a procedure for males and females. Natural family planning methods involve nothaving sex on days when the woman could become pregnant. This information is not intended to replace advice given to you by your health care provider. Make sure you discuss any questions you have with your health care provider. Document Revised: 11/01/2019 Document Reviewed: 11/01/2019 Elsevier Patient Education  2021 [employer] Inc.   Breastfeeding  Choosing to breastfeed is one of the best decisions you can make for yourself and your baby. A change in hormones during pregnancy causes your breasts to make breast milk in your milk-producing glands. Hormones prevent breast milk from being released before your baby is born. They also prompt milk flow after birth. Once breastfeeding has begun, thoughts of your baby, as well as his or her sucking or crying, can stimulate the release of milk from your milk-producing glands. Benefits of breastfeeding Research shows that breastfeeding offers many health benefits   for infants and mothers. It also offers a cost-free and convenient way to feed your baby. For your baby  Your first milk (colostrum) helps your baby's digestive system to function better.  Special cells in your milk (antibodies) help your baby to fight off infections.  Breastfed babies are less likely to develop asthma, allergies, obesity, or type 2 diabetes. They are also at lower risk for sudden infant death syndrome (SIDS).  Nutrients in breast milk are better  able to meet your baby's needs compared to infant formula.  Breast milk improves your baby's brain development. For you  Breastfeeding helps to create a very special bond between you and your baby.  Breastfeeding is convenient. Breast milk costs nothing and is always available at the correct temperature.  Breastfeeding helps to burn calories. It helps you to lose the weight that you gained during pregnancy.  Breastfeeding makes your uterus return faster to its size before pregnancy. It also slows bleeding (lochia) after you give birth.  Breastfeeding helps to lower your risk of developing type 2 diabetes, osteoporosis, rheumatoid arthritis, cardiovascular disease, and breast, ovarian, uterine, and endometrial cancer later in life. Breastfeeding basics Starting breastfeeding  Find a comfortable place to sit or lie down, with your neck and back well-supported.  Place a pillow or a rolled-up blanket under your baby to bring him or her to the level of your breast (if you are seated). Nursing pillows are specially designed to help support your arms and your baby while you breastfeed.  Make sure that your baby's tummy (abdomen) is facing your abdomen.  Gently massage your breast. With your fingertips, massage from the outer edges of your breast inward toward the nipple. This encourages milk flow. If your milk flows slowly, you may need to continue this action during the feeding.  Support your breast with 4 fingers underneath and your thumb above your nipple (make the letter "C" with your hand). Make sure your fingers are well away from your nipple and your baby's mouth.  Stroke your baby's lips gently with your finger or nipple.  When your baby's mouth is open wide enough, quickly bring your baby to your breast, placing your entire nipple and as much of the areola as possible into your baby's mouth. The areola is the colored area around your nipple. ? More areola should be visible above your  baby's upper lip than below the lower lip. ? Your baby's lips should be opened and extended outward (flanged) to ensure an adequate, comfortable latch. ? Your baby's tongue should be between his or her lower gum and your breast.  Make sure that your baby's mouth is correctly positioned around your nipple (latched). Your baby's lips should create a seal on your breast and be turned out (everted).  It is common for your baby to suck about 2-3 minutes in order to start the flow of breast milk. Latching Teaching your baby how to latch onto your breast properly is very important. An improper latch can cause nipple pain, decreased milk supply, and poor weight gain in your baby. Also, if your baby is not latched onto your nipple properly, he or she may swallow some air during feeding. This can make your baby fussy. Burping your baby when you switch breasts during the feeding can help to get rid of the air. However, teaching your baby to latch on properly is still the best way to prevent fussiness from swallowing air while breastfeeding. Signs that your baby has successfully latched onto   your nipple  Silent tugging or silent sucking, without causing you pain. Infant's lips should be extended outward (flanged).  Swallowing heard between every 3-4 sucks once your milk has started to flow (after your let-down milk reflex occurs).  Muscle movement above and in front of his or her ears while sucking. Signs that your baby has not successfully latched onto your nipple  Sucking sounds or smacking sounds from your baby while breastfeeding.  Nipple pain. If you think your baby has not latched on correctly, slip your finger into the corner of your baby's mouth to break the suction and place it between your baby's gums. Attempt to start breastfeeding again. Signs of successful breastfeeding Signs from your baby  Your baby will gradually decrease the number of sucks or will completely stop sucking.  Your baby  will fall asleep.  Your baby's body will relax.  Your baby will retain a small amount of milk in his or her mouth.  Your baby will let go of your breast by himself or herself. Signs from you  Breasts that have increased in firmness, weight, and size 1-3 hours after feeding.  Breasts that are softer immediately after breastfeeding.  Increased milk volume, as well as a change in milk consistency and color by the fifth day of breastfeeding.  Nipples that are not sore, cracked, or bleeding. Signs that your baby is getting enough milk  Wetting at least 1-2 diapers during the first 24 hours after birth.  Wetting at least 5-6 diapers every 24 hours for the first week after birth. The urine should be clear or pale yellow by the age of 5 days.  Wetting 6-8 diapers every 24 hours as your baby continues to grow and develop.  At least 3 stools in a 24-hour period by the age of 5 days. The stool should be soft and yellow.  At least 3 stools in a 24-hour period by the age of 7 days. The stool should be seedy and yellow.  No loss of weight greater than 10% of birth weight during the first 3 days of life.  Average weight gain of 4-7 oz (113-198 g) per week after the age of 4 days.  Consistent daily weight gain by the age of 5 days, without weight loss after the age of 2 weeks. After a feeding, your baby may spit up a small amount of milk. This is normal. Breastfeeding frequency and duration Frequent feeding will help you make more milk and can prevent sore nipples and extremely full breasts (breast engorgement). Breastfeed when you feel the need to reduce the fullness of your breasts or when your baby shows signs of hunger. This is called "breastfeeding on demand." Signs that your baby is hungry include:  Increased alertness, activity, or restlessness.  Movement of the head from side to side.  Opening of the mouth when the corner of the mouth or cheek is stroked (rooting).  Increased  sucking sounds, smacking lips, cooing, sighing, or squeaking.  Hand-to-mouth movements and sucking on fingers or hands.  Fussing or crying. Avoid introducing a pacifier to your baby in the first 4-6 weeks after your baby is born. After this time, you may choose to use a pacifier. Research has shown that pacifier use during the first year of a baby's life decreases the risk of sudden infant death syndrome (SIDS). Allow your baby to feed on each breast as long as he or she wants. When your baby unlatches or falls asleep while feeding from the   first breast, offer the second breast. Because newborns are often sleepy in the first few weeks of life, you may need to awaken your baby to get him or her to feed. Breastfeeding times will vary from baby to baby. However, the following rules can serve as a guide to help you make sure that your baby is properly fed:  Newborns (babies 4 weeks of age or younger) may breastfeed every 1-3 hours.  Newborns should not go without breastfeeding for longer than 3 hours during the day or 5 hours during the night.  You should breastfeed your baby a minimum of 8 times in a 24-hour period. Breast milk pumping Pumping and storing breast milk allows you to make sure that your baby is exclusively fed your breast milk, even at times when you are unable to breastfeed. This is especially important if you go back to work while you are still breastfeeding, or if you are not able to be present during feedings. Your lactation consultant can help you find a method of pumping that works best for you and give you guidelines about how long it is safe to store breast milk.      Caring for your breasts while you breastfeed Nipples can become dry, cracked, and sore while breastfeeding. The following recommendations can help keep your breasts moisturized and healthy:  Avoid using soap on your nipples.  Wear a supportive bra designed especially for nursing. Avoid wearing underwire-style  bras or extremely tight bras (sports bras).  Air-dry your nipples for 3-4 minutes after each feeding.  Use only cotton bra pads to absorb leaked breast milk. Leaking of breast milk between feedings is normal.  Use lanolin on your nipples after breastfeeding. Lanolin helps to maintain your skin's normal moisture barrier. Pure lanolin is not harmful (not toxic) to your baby. You may also hand express a few drops of breast milk and gently massage that milk into your nipples and allow the milk to air-dry. In the first few weeks after giving birth, some women experience breast engorgement. Engorgement can make your breasts feel heavy, warm, and tender to the touch. Engorgement peaks within 3-5 days after you give birth. The following recommendations can help to ease engorgement:  Completely empty your breasts while breastfeeding or pumping. You may want to start by applying warm, moist heat (in the shower or with warm, water-soaked hand towels) just before feeding or pumping. This increases circulation and helps the milk flow. If your baby does not completely empty your breasts while breastfeeding, pump any extra milk after he or she is finished.  Apply ice packs to your breasts immediately after breastfeeding or pumping, unless this is too uncomfortable for you. To do this: ? Put ice in a plastic bag. ? Place a towel between your skin and the bag. ? Leave the ice on for 20 minutes, 2-3 times a day.  Make sure that your baby is latched on and positioned properly while breastfeeding. If engorgement persists after 48 hours of following these recommendations, contact your health care provider or a lactation consultant. Overall health care recommendations while breastfeeding  Eat 3 healthy meals and 3 snacks every day. Well-nourished mothers who are breastfeeding need an additional 450-500 calories a day. You can meet this requirement by increasing the amount of a balanced diet that you eat.  Drink  enough water to keep your urine pale yellow or clear.  Rest often, relax, and continue to take your prenatal vitamins to prevent fatigue, stress, and low   vitamin and mineral levels in your body (nutrient deficiencies).  Do not use any products that contain nicotine or tobacco, such as cigarettes and e-cigarettes. Your baby may be harmed by chemicals from cigarettes that pass into breast milk and exposure to secondhand smoke. If you need help quitting, ask your health care provider.  Avoid alcohol.  Do not use illegal drugs or marijuana.  Talk with your health care provider before taking any medicines. These include over-the-counter and prescription medicines as well as vitamins and herbal supplements. Some medicines that may be harmful to your baby can pass through breast milk.  It is possible to become pregnant while breastfeeding. If birth control is desired, ask your health care provider about options that will be safe while breastfeeding your baby. Where to find more information: [employer]: www.llli.org Contact a health care provider if:  You feel like you want to stop breastfeeding or have become frustrated with breastfeeding.  Your nipples are cracked or bleeding.  Your breasts are red, tender, or warm.  You have: ? Painful breasts or nipples. ? A swollen area on either breast. ? A fever or chills. ? Nausea or vomiting. ? Drainage other than breast milk from your nipples.  Your breasts do not become full before feedings by the fifth day after you give birth.  You feel sad and depressed.  Your baby is: ? Too sleepy to eat well. ? Having trouble sleeping. ? More than 1 week old and wetting fewer than 6 diapers in a 24-hour period. ? Not gaining weight by 5 days of age.  Your baby has fewer than 3 stools in a 24-hour period.  Your baby's skin or the white parts of his or her eyes become yellow. Get help right away if:  Your baby is overly tired  (lethargic) and does not want to wake up and feed.  Your baby develops an unexplained fever. Summary  Breastfeeding offers many health benefits for infant and mothers.  Try to breastfeed your infant when he or she shows early signs of hunger.  Gently tickle or stroke your baby's lips with your finger or nipple to allow the baby to open his or her mouth. Bring the baby to your breast. Make sure that much of the areola is in your baby's mouth. Offer one side and burp the baby before you offer the other side.  Talk with your health care provider or lactation consultant if you have questions or you face problems as you breastfeed. This information is not intended to replace advice given to you by your health care provider. Make sure you discuss any questions you have with your health care provider. Document Revised: 08/21/2017 Document Reviewed: 06/28/2016 Elsevier Patient Education  2021 [employer] Inc.  

## 2020-11-02 NOTE — Progress Notes (Signed)
Reports urinary frequency continues since MAU visit 5/17. Now reports vaginal itching and irritation and white discharge. C/O a lot of vaginal pressure. Reports SVE on 5/17 1cm. Had MFM [place] today, reports recommendation for earlier delivery.

## 2020-11-08 DIAGNOSIS — Z419 Encounter for procedure for purposes other than remedying health state, unspecified: Secondary | ICD-10-CM | POA: Diagnosis not present

## 2020-11-09 ENCOUNTER — Ambulatory Visit: Payer: Medicaid Other | Admitting: *Deleted

## 2020-11-09 ENCOUNTER — Other Ambulatory Visit: Payer: Self-pay

## 2020-11-09 ENCOUNTER — Encounter (HOSPITAL_COMMUNITY): Payer: Self-pay | Admitting: Obstetrics & Gynecology

## 2020-11-09 ENCOUNTER — Inpatient Hospital Stay (HOSPITAL_COMMUNITY)
Admission: AD | Admit: 2020-11-09 | Discharge: 2020-11-09 | Disposition: A | Discharge status: Home / Self Care | Payer: Medicaid Other | Attending: Obstetrics & Gynecology | Admitting: Obstetrics & Gynecology

## 2020-11-09 ENCOUNTER — Ambulatory Visit: Payer: Medicaid Other | Attending: Obstetrics

## 2020-11-09 ENCOUNTER — Other Ambulatory Visit: Payer: Self-pay | Admitting: Obstetrics

## 2020-11-09 ENCOUNTER — Encounter: Payer: Self-pay | Admitting: *Deleted

## 2020-11-09 DIAGNOSIS — Z87891 Personal history of nicotine dependence: Secondary | ICD-10-CM | POA: Insufficient documentation

## 2020-11-09 DIAGNOSIS — O0990 Supervision of high risk pregnancy, unspecified, unspecified trimester: Secondary | ICD-10-CM

## 2020-11-09 DIAGNOSIS — Z79899 Other long term (current) drug therapy: Secondary | ICD-10-CM | POA: Insufficient documentation

## 2020-11-09 DIAGNOSIS — Z7982 Long term (current) use of aspirin: Secondary | ICD-10-CM | POA: Diagnosis not present

## 2020-11-09 DIAGNOSIS — R519 Headache, unspecified: Secondary | ICD-10-CM | POA: Diagnosis not present

## 2020-11-09 DIAGNOSIS — K219 Gastro-esophageal reflux disease without esophagitis: Secondary | ICD-10-CM | POA: Diagnosis not present

## 2020-11-09 DIAGNOSIS — O99613 Diseases of the digestive system complicating pregnancy, third trimester: Secondary | ICD-10-CM | POA: Insufficient documentation

## 2020-11-09 DIAGNOSIS — O10913 Unspecified pre-existing hypertension complicating pregnancy, third trimester: Secondary | ICD-10-CM | POA: Insufficient documentation

## 2020-11-09 DIAGNOSIS — G44209 Tension-type headache, unspecified, not intractable: Secondary | ICD-10-CM

## 2020-11-09 DIAGNOSIS — O10919 Unspecified pre-existing hypertension complicating pregnancy, unspecified trimester: Secondary | ICD-10-CM

## 2020-11-09 DIAGNOSIS — O26893 Other specified pregnancy related conditions, third trimester: Secondary | ICD-10-CM | POA: Diagnosis not present

## 2020-11-09 DIAGNOSIS — Z3689 Encounter for other specified antenatal screening: Secondary | ICD-10-CM

## 2020-11-09 DIAGNOSIS — Z3A33 33 weeks gestation of pregnancy: Secondary | ICD-10-CM | POA: Diagnosis not present

## 2020-11-09 MED ORDER — BUTALBITAL-APAP-CAFFEINE 50-325-40 MG PO TABS
2.0000 | ORAL_TABLET | Freq: Once | ORAL | Status: AC
  Administered 2020-11-09: 2 via ORAL
  Filled 2020-11-09: qty 2

## 2020-11-09 NOTE — Discharge Instructions (Signed)
Tension Headache, Adult A tension headache is a feeling of pain, pressure, or aching over the front and sides of the head. The pain can be dull, or it can feel tight. There are two types of tension headache:  Episodic tension headache. This is when the headaches happen fewer than 15 days a month.  Chronic tension headache. This is when the headaches happen more than 15 days a month during a 3-month period. A tension headache can last from 30 minutes to several days. It is the most common kind of headache. Tension headaches are not normally associated with nausea or vomiting, and they do not get worse with physical activity. What are the causes? The exact cause of this condition is not known. Tension headaches are often triggered by stress, anxiety, or depression. Other triggers may include:  Alcohol.  Too much caffeine or caffeine withdrawal.  Respiratory infections, such as colds, flu, or sinus infections.  Dental problems or teeth clenching.  Fatigue.  Holding your head and neck in the same position for a long period of time, such as while using a computer.  Smoking.  Arthritis of the neck. What are the signs or symptoms? Symptoms of this condition include:  A feeling of pressure or tightness around the head.  Dull, aching head pain.  Pain over the front and sides of the head.  Tenderness in the muscles of the head, neck, and shoulders. How is this diagnosed? This condition may be diagnosed based on your symptoms, your medical history, and a physical exam. If your symptoms are severe or unusual, you may have imaging tests, such as a CT scan or an MRI of your head. Your vision may also be checked. How is this treated? This condition may be treated with lifestyle changes and with medicines that help relieve symptoms. Follow these instructions at home: Managing pain  Take over-the-counter and prescription medicines only as told by your health care provider.  When you have  a headache, lie down in a dark, quiet room.  If directed, put ice on your head and neck. To do this: ? Put ice in a plastic bag. ? Place a towel between your skin and the bag. ? Leave the ice on for 20 minutes, 2-3 times a day. ? Remove the ice if your skin turns bright red. This is very important. If you cannot feel pain, heat, or cold, you have a greater risk of damage to the area.  If directed, apply heat to the back of your neck as often as told by your health care provider. Use the heat source that your health care provider recommends, such as a moist heat pack or a heating pad. ? Place a towel between your skin and the heat source. ? Leave the heat on for 20-30 minutes. ? Remove the heat if your skin turns bright red. This is especially important if you are unable to feel pain, heat, or cold. You have a greater risk of getting burned. Eating and drinking  Eat meals on a regular schedule.  If you drink alcohol: ? Limit how much you have to:  0-1 drink a day for women who are not pregnant.  0-2 drinks a day for men. ? Know how much alcohol is in your drink. In the [place] one drink equals one 12 oz bottle of beer (355 mL), one 5 oz glass of wine (148 mL), or one 1 oz glass of hard liquor (44 mL).  Drink enough fluid to keep your urine   pale yellow.  Decrease your caffeine intake, or stop using caffeine. Lifestyle  Get 7-9 hours of sleep each night, or get the amount of sleep recommended by your health care provider.  At bedtime, remove computers, phones, and tablets from your room.  Find ways to manage your stress. This may include: ? Exercise. ? Deep breathing exercises. ? Yoga. ? Listening to music. ? Positive mental imagery.  Try to sit up straight and avoid tensing your muscles.  Do not use any products that contain nicotine or tobacco. These include cigarettes, chewing tobacco, and vaping devices, such as e-cigarettes. If you need help quitting, ask your health care  provider. General instructions  Avoid any headache triggers. Keep a journal to help find out what may trigger your headaches. For example, write down: ? What you eat and drink. ? How much sleep you get. ? Any change to your diet or medicines.  Keep all follow-up visits. This is important.   Contact a health care provider if:  Your headache does not get better.  Your headache comes back.  You are sensitive to sounds, light, or smells because of a headache.  You have nausea or you vomit.  Your stomach hurts. Get help right away if:  You suddenly develop a severe headache, along with any of the following: ? A stiff neck. ? Nausea and vomiting. ? Confusion. ? Weakness in one part or one side of your body. ? Double vision or loss of vision. ? Shortness of breath. ? Rash. ? Unusual sleepiness. ? Fever or chills. ? Trouble speaking. ? Pain in your eye or ear. ? Trouble walking or balancing. ? Feeling faint or passing out. Summary  A tension headache is a feeling of pain, pressure, or aching over the front and sides of the head.  A tension headache can last from 30 minutes to several days. It is the most common kind of headache.  This condition may be diagnosed based on your symptoms, your medical history, and a physical exam.  This condition may be treated with lifestyle changes and with medicines that help relieve symptoms. This information is not intended to replace advice given to you by your health care provider. Make sure you discuss any questions you have with your health care provider. Document Revised: 02/24/2020 Document Reviewed: 02/24/2020 Elsevier Patient Education  2021 Elsevier Inc.  

## 2020-11-09 NOTE — Procedures (Signed)
Andrea Burns Jan 04, 1986 [redacted]w[redacted]d  Fetus A Non-Stress Test Interpretation for 11/09/20  Indication: Chronic Hypertenstion and Unsatisfactory BPP  Fetal Heart Rate A Mode: External Baseline Rate (A): 125 bpm Variability: Moderate Accelerations: 15 x 15 Decelerations: None Multiple birth?: No  Uterine Activity Mode: Palpation,Toco Contraction Frequency (min): Occas UI Contraction Quality: Mild Resting Tone Palpated: Relaxed Resting Time: Adequate  Interpretation (Fetal Testing) Nonstress Test Interpretation: Reactive Comments: Dr. Parke Poisson reviewed tracing.

## 2020-11-09 NOTE — MAU Provider Note (Signed)
History     CSN: [id number]  Arrival date and time: 11/09/20 2004   Event Date/Time   First Provider Initiated Contact with Patient 11/09/20 2054      Chief Complaint  Patient presents with  . Headache   Andrea Burns is a 35 y.o. [id number] at [redacted]w[redacted]d who receives care at [facility].  She presents today for Headache. She reports her headache has been present since yesterday  She states it is only on the left side from the eye to the back of her neck.  She describes the pain as pressure that is constant.  She reports she has taken aspirin, tylenol, and rubbed alcohol/vapor rub on the area without relief.  She reports a history of chronic migraines, but states she hasn't been evaluated by a neurologist for "a long long time."    She reports she takes Labetalol  and Procardia  daily and has taken it. She states her headache has been present for 2 days and questions if she will need a PreEclampsia work up.   Patient endorses fetal movement. She denies vaginal bleeding or leaking.  She reports occasional contractions and/or cramping.       OB History    Gravida  6   Para  4   Term  4   Preterm      AB  1   Living  4     SAB  1   IAB      Ectopic      Multiple      Live Births  4           Past Medical History:  Diagnosis Date  . Anxiety   . Depression   . GERD (gastroesophageal reflux disease)   . HA (headache)   . Hypertension   . 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 Medical history non-contributory   . Pneumonia    2013    Past Surgical History:  Procedure Laterality Date  . APPENDECTOMY    . COLPOSCOPY W/ BIOPSY / CURETTAGE    . IUD REMOVAL    . LAPAROSCOPIC APPENDECTOMY N/A 10/18/2019   Procedure: APPENDECTOMY LAPAROSCOPIC;  Surgeon: [name], MD;  Location: [facility];  Service: General;  Laterality: N/A;  . LAPAROSCOPY ABDOMEN DIAGNOSTIC     Removal of migrated IUD   . NO PAST SURGERIES       Family History  Problem Relation Age of Onset  . Diabetes Mother   . Hypertension Mother   . Cancer Mother   . Diabetes Father   . Cancer Paternal Grandmother        liver & lung    Social History   Tobacco Use  . Smoking status: Former Smoker    Packs/day: 0.25    Years: 1.00    Pack years: 0.25    Quit date: 05/03/2005    Years since quitting: 15.5  . Smokeless tobacco: Never Used  Vaping Use  . Vaping Use: Never used  Substance Use Topics  . Alcohol use: Not Currently    Alcohol/week: 0.0 standard drinks  . Drug use: Not Currently    Types: Cocaine    Comment: last used 3 to 4 months ago    Allergies: No Known Allergies  Medications Prior to Admission  Medication Sig Dispense Refill Last Dose  . albuterol (VENTOLIN HFA) 108 (90 Base) MCG/ACT inhaler Inhale 2 puffs into the lungs every 4 (four) hours as needed for wheezing  or shortness of breath. 18 g 0 11/09/2020 at Unknown time  . aspirin EC 81 MG tablet Take 1 tablet (81 mg total) by mouth daily. Take after 12 weeks for prevention of preeclampsia later in pregnancy 300 tablet 2 11/09/2020 at Unknown time  . ferrous sulfate (FERROUSUL) 325 (65 FE) MG tablet Take 1 tablet (325 mg total) by mouth 2 (two) times daily. 60 tablet 1 11/09/2020 at Unknown time  . labetalol (NORMODYNE) 300 MG tablet Take 2.5 tablets (750 mg total) by mouth 2 (two) times daily. 450 tablet 3 11/09/2020 at Unknown time  . NIFEdipine (PROCARDIA XL/NIFEDICAL XL) 60 MG 24 hr tablet Take 1 tablet (60 mg total) by mouth daily. 90 tablet 3 11/09/2020 at Unknown time  . Prenatal Vit-Fe Fumarate-FA (PRENATAL MULTIVITAMIN) TABS tablet Take 1 tablet by mouth daily at 12 noon.   11/09/2020 at Unknown time  . butalbital-acetaminophen-caffeine (FIORICET) 50-325-40 MG tablet Take 1 tablet by mouth every 6 (six) hours as needed for headache. (Patient not taking: Reported on 11/02/2020) 10 tablet 3   . cefadroxil (DURICEF) 500 MG capsule Take 1 capsule (500 mg total) by  mouth 2 (two) times daily. (Patient not taking: Reported on 11/02/2020) 14 capsule 0   . docusate sodium (COLACE) 100 MG capsule Take 1 capsule (100 mg total) by mouth 2 (two) times daily as needed. (Patient not taking: Reported on 11/02/2020) 30 capsule 2   . Ferrous Sulfate (IRON PO) Take 1 tablet by mouth daily.     . ondansetron (ZOFRAN ODT) 4 MG disintegrating tablet Take 1 tablet (4 mg total) by mouth every 8 (eight) hours as needed. (Patient not taking: Reported on 11/02/2020) 20 tablet 6     Review of Systems  Gastrointestinal: Positive for nausea.  Neurological: Positive for headaches. Negative for dizziness and light-headedness.   Physical Exam   Blood pressure 126/74, pulse 86, temperature 97.8 F (36.6 C), height 5\' 2"  (1.575 m), weight 92.5 kg, SpO2 97 %.  Vitals:   11/09/20 2017 11/09/20 2032 11/09/20 2045 11/09/20 2046  BP: 135/82 132/74  126/74  Pulse: 87 88  86  Temp: 97.8 F (36.6 C)     SpO2:   97%   Weight: 92.5 kg     Height: 5\' 2"  (1.575 m)        Physical Exam Vitals reviewed.  Constitutional:      Appearance: She is well-developed.  HENT:     Head: Normocephalic and atraumatic.  Eyes:     Conjunctiva/sclera: Conjunctivae normal.  Cardiovascular:     Rate and Rhythm: Normal rate.  Pulmonary:     Effort: Pulmonary effort is normal. No respiratory distress.  Musculoskeletal:     Cervical back: Normal range of motion.  Skin:    General: Skin is warm and dry.  Neurological:     Mental Status: She is alert and oriented to person, place, and time.  Psychiatric:        Mood and Affect: Mood normal.        Behavior: Behavior normal.        Thought Content: Thought content normal.     Fetal Assessment 125 bpm, Mod Var, -Decels, +Accels Toco: None graphed  MAU Course  No results found for this or any previous visit (from the past 24 hour(s)). 01/09/21 MFM FETAL BPP W/NONSTRESS  Result Date:  11/09/2020 ----------------------------------------------------------------------  OBSTETRICS REPORT                       (  Signed Final 11/09/2020 03:45 pm) ---------------------------------------------------------------------- Patient Info  ID #:       [id number]                          D.O.B.:  1986-01-27 (35 yrs)  Name:       Andrea Burns               Visit Date: 11/09/2020 08:47 am ---------------------------------------------------------------------- Performed By  Attending:        Ma Rings MD         Ref. Address:     Faculty  Performed By:     Reinaldo Raddle            [facility]  Referred By:      Catalina Antigua MD ---------------------------------------------------------------------- Orders  #  Description                           Code        Ordered By  1  [place] MFM FETAL BPP                      54270.6     Rosana Hoes     W/NONSTRESS ----------------------------------------------------------------------  #  Order #                     Accession #                Episode #  1  [id number]                   [id number]                 [id number] ---------------------------------------------------------------------- Indications  Hypertension - Chronic/Pre-existing -          O10.019  labetalol and procardia  Obesity complicating pregnancy, second         O99.212  trimester (BMI 32)  Neg AFP  [redacted] weeks gestation of pregnancy                Z3A.33 ---------------------------------------------------------------------- Fetal Evaluation  Num Of Fetuses:         1  Fetal Heart Rate(bpm):  133  Cardiac Activity:       Observed  Presentation:           Cephalic  Placenta:               Anterior  P. Cord Insertion:      Visualized, central  Amniotic Fluid  AFI FV:       Within normal limits  AFI Sum(cm)     %Tile       Largest Pocket(cm)  9.82            17          6.22  RUQ(cm)       RLQ(cm)       LUQ(cm)        LLQ(cm)  6.22          0             0.35           3.25 ---------------------------------------------------------------------- Biophysical Evaluation  Amniotic F.V:   Pocket => 2 cm             F. Tone:        Observed  F. Movement:    Observed                   N.S.T:          Reactive  F. Breathing:   Not Observed               Score:          8/10 ---------------------------------------------------------------------- Biometry  LV:        3.5  mm ---------------------------------------------------------------------- OB History  Gravidity:    6         Term:   4        Prem:   0        SAB:   1  TOP:          0       Ectopic:  0        Living: 4 ---------------------------------------------------------------------- Gestational Age  Best:          33w 1d     Det. By:  Marcella Dubs         EDD:   12/27/20                                      (05/17/20) ---------------------------------------------------------------------- Anatomy  Cranium:               Previously seen        LVOT:                   Previously seen  Cavum:                 Previously seen        Aortic Arch:            Previously seen  Ventricles:            Appears normal         Ductal Arch:            Previously seen  Choroid Plexus:        Previously seen        Diaphragm:              Appears normal  Cerebellum:  Previously seen        Stomach:                Appears normal, left                                                                        sided  Posterior Fossa:       Previously seen        Abdomen:                Previously seen  Nuchal Fold:           Previously seen        Abdominal Wall:         Previously seen  Face:                  Orbits and profile     Cord Vessels:           Previously seen                         previously seen  Lips:                  Previously  seen        Kidneys:                Appear normal  Palate:                Previously seen        Bladder:                Appears normal  Thoracic:              Previously seen        Spine:                  Previously seen  Heart:                 Appears normal         Upper Extremities:      Previously seen                         (4CH, axis, and                         situs)  RVOT:                  Previously seen        Lower Extremities:      Previously seen  Other:  Fetus appears to be female. SVC IVC,  3VV/T, Heels, 5th digit and          nasal bone previously visualized. ---------------------------------------------------------------------- Cervix Uterus Adnexa  Cervix  Not visualized (advanced GA >24wks) ---------------------------------------------------------------------- Comments  This patient was seen for a BPP due to chronic hypertension  that is treated with labetalol and nifedipine.  The patient  reports that she has had a headache since last night.  Her  blood pressure today was 135/67.  A biophysical profile performed today was 6 out of 8.  She  received a -2 for  fetal breathing movements that did not meet  criteria.  She subsequently had a reactive nonstress test  making her total biophysical profile score 8 out of 10.  Preeclampsia precautions were reviewed with the patient  today.  She was advised to go to the hospital should she  continue to experience a headache.  As she is currently treated with two antihypertensive  medications, delivery may be considered at between 37 to 38  weeks.  Another biophysical profile scheduled in 1 week. ----------------------------------------------------------------------                   Ma Rings, MD Electronically Signed Final Report   11/09/2020 03:45 pm ----------------------------------------------------------------------   MDM PE Labs: None EFM Pain Medication Assessment and Plan  35 year old [id number]  SIUP at 33.2 weeks Cat I  FT Headache H/O Migraines  -POC Reviewed -Reassured that blood pressures are normotensive this evening. -Informed that if headache improves will not perform PreE labs. -Discussed usage of Fioricet and patient states she has script, but has not picked up yet. -Will give fioricet now and reassess. -Will also give warm compress.  Cherre Robins MSN, CNM 11/09/2020, 8:54 PM   Reassessment (11:01 PM) -Patient reports improvement with HA. -Discussed usage of Fioricet at home as needed. -Patient expresses concern for office not yet having scheduled induction at 37 weeks. -Instructed to discuss at next visit.  Patient reports her visit is not yet scheduled and instructed to call office tomorrow to do so. -Patient without further questions. -Encouraged to call or return to MAU if symptoms worsen or with the onset of new symptoms. -Discharged to home in improved condition.  Cherre Robins MSN, CNM Advanced Practice Provider, [facility] [employer]

## 2020-11-15 ENCOUNTER — Ambulatory Visit: Payer: Medicaid Other | Admitting: *Deleted

## 2020-11-15 ENCOUNTER — Ambulatory Visit (INDEPENDENT_AMBULATORY_CARE_PROVIDER_SITE_OTHER): Payer: Medicaid Other

## 2020-11-15 ENCOUNTER — Other Ambulatory Visit: Payer: Self-pay

## 2020-11-15 VITALS — BP 134/75 | HR 77 | Wt 203.5 lb

## 2020-11-15 DIAGNOSIS — O10919 Unspecified pre-existing hypertension complicating pregnancy, unspecified trimester: Secondary | ICD-10-CM | POA: Diagnosis not present

## 2020-11-15 NOTE — Progress Notes (Signed)
Pt informed that the ultrasound is considered a limited OB ultrasound and is not intended to be a complete ultrasound exam.  Patient also informed that the ultrasound is not being completed with the intent of assessing for fetal or placental anomalies or any pelvic abnormalities.  Explained that the purpose of today's ultrasound is to assess for presentation, BPP and amniotic fluid volume.  Patient acknowledges the purpose of the exam and the limitations of the study.    Pt had PHQ9 and GAD7 scores of 19.  She was offered [facility] and stated she had spoken with Sue Lush in the past. Pt was advised that she can be scheduled with Sue Lush again or Asher Muir in our office. She declined [facility] @ this time.

## 2020-11-16 ENCOUNTER — Ambulatory Visit (INDEPENDENT_AMBULATORY_CARE_PROVIDER_SITE_OTHER): Payer: Medicaid Other | Admitting: Family Medicine

## 2020-11-16 ENCOUNTER — Encounter: Payer: Self-pay | Admitting: Family Medicine

## 2020-11-16 VITALS — BP 124/79 | HR 83 | Wt 203.0 lb

## 2020-11-16 DIAGNOSIS — O99340 Other mental disorders complicating pregnancy, unspecified trimester: Secondary | ICD-10-CM

## 2020-11-16 DIAGNOSIS — F1911 Other psychoactive substance abuse, in remission: Secondary | ICD-10-CM

## 2020-11-16 DIAGNOSIS — O0990 Supervision of high risk pregnancy, unspecified, unspecified trimester: Secondary | ICD-10-CM

## 2020-11-16 DIAGNOSIS — F32A Depression, unspecified: Secondary | ICD-10-CM

## 2020-11-16 DIAGNOSIS — O10919 Unspecified pre-existing hypertension complicating pregnancy, unspecified trimester: Secondary | ICD-10-CM

## 2020-11-16 NOTE — Progress Notes (Signed)
   PRENATAL VISIT NOTE  Subjective:  Andrea Burns is a 35 y.o. [id number] at [redacted]w[redacted]d being seen today for ongoing prenatal care.  She is currently monitored for the following issues for this high-risk pregnancy and has Depression affecting pregnancy; Acute appendicitis; Chronic hypertension affecting pregnancy; Supervision of high risk pregnancy, antepartum; History of substance abuse (HCC); Leg cramps; and Chronic migraine w/o aura w/o status migrainosus, not intractable on their problem list.  Patient reports backache.  Contractions: Irritability. Vag. Bleeding: None.  Movement: Present. Denies leaking of fluid.   The following portions of the patient's history were reviewed and updated as appropriate: allergies, current medications, past family history, past medical history, past social history, past surgical history and problem list.   Objective:   Vitals:   11/16/20 1515  BP: 124/79  Pulse: 83  Weight: 203 lb (92.1 kg)    Fetal Status: Fetal Heart Rate (bpm): 134   Movement: Present     General:  Alert, oriented and cooperative. Patient is in no acute distress.  Skin: Skin is warm and dry. No rash noted.   Cardiovascular: Normal heart rate noted  Respiratory: Normal respiratory effort, no problems with respiration noted  Abdomen: Soft, gravid, appropriate for gestational age.  Pain/Pressure: Present     Pelvic: Cervical exam deferred        Extremities: Normal range of motion.  Edema: Trace  Mental Status: Normal mood and affect. Normal behavior. Normal judgment and thought content.   Assessment and Plan:  Pregnancy: [id number] at [redacted]w[redacted]d 1. Supervision of high risk pregnancy, antepartum Up to date Patient with general complaints like back pain-- reviewed comfort measure  2. History of substance abuse (HCC) Not using  3. Chronic hypertension affecting pregnancy MFM on 6/2 and 5/26 discussed delivery 37-38 weeks Patient would like delivery as soon as possible. BP today is  WNL On Labetalol and Procardia Plan to schedule IOL at next appointment  4. Depression affecting pregnancy Interactive with MD. Recommend close pp follow up  Preterm labor symptoms and general obstetric precautions including but not limited to vaginal bleeding, contractions, leaking of fluid and fetal movement were reviewed in detail with the patient. Please refer to After Visit Summary for other counseling recommendations.   No follow-ups on file.  Future Appointments  Date Time Provider Department Center  11/23/2020  8:00 AM [facility] NURSE [facility]  11/23/2020  8:15 AM [facility] US2 [facility]  11/29/2020  8:15 AM [facility] NST [facility]  12/21/2020 12:45 PM Glean Salvo, NP [facility] None  03/08/2021  9:00 AM Jake Bathe, MD [facility]    Federico Flake, MD

## 2020-11-16 NOTE — Progress Notes (Signed)
Pt presents for ROB  NST/BPP completed yesterday- report not in Epic yet

## 2020-11-17 NOTE — Progress Notes (Signed)
  NST:  Baseline: 125 bpm, Variability: Good {> 6 bpm), Accelerations: Reactive, and Decelerations: Absent  

## 2020-11-17 NOTE — Progress Notes (Signed)
Patient seen and assessed by nursing staff.  Agree with documentation and plan.  

## 2020-11-22 ENCOUNTER — Other Ambulatory Visit: Payer: Self-pay

## 2020-11-23 ENCOUNTER — Ambulatory Visit: Payer: Medicaid Other | Attending: Obstetrics

## 2020-11-23 ENCOUNTER — Other Ambulatory Visit: Payer: Self-pay

## 2020-11-23 ENCOUNTER — Encounter (HOSPITAL_COMMUNITY): Payer: Self-pay | Admitting: Obstetrics & Gynecology

## 2020-11-23 ENCOUNTER — Inpatient Hospital Stay (HOSPITAL_COMMUNITY)
Admission: AD | Admit: 2020-11-23 | Discharge: 2020-11-23 | Disposition: A | Discharge status: Home / Self Care | Payer: Medicaid Other | Attending: Obstetrics & Gynecology | Admitting: Obstetrics & Gynecology

## 2020-11-23 ENCOUNTER — Ambulatory Visit: Payer: Medicaid Other | Admitting: *Deleted

## 2020-11-23 ENCOUNTER — Encounter: Payer: Self-pay | Admitting: *Deleted

## 2020-11-23 VITALS — BP 128/85 | HR 89

## 2020-11-23 DIAGNOSIS — O99213 Obesity complicating pregnancy, third trimester: Secondary | ICD-10-CM | POA: Diagnosis not present

## 2020-11-23 DIAGNOSIS — O10013 Pre-existing essential hypertension complicating pregnancy, third trimester: Secondary | ICD-10-CM

## 2020-11-23 DIAGNOSIS — O10913 Unspecified pre-existing hypertension complicating pregnancy, third trimester: Secondary | ICD-10-CM

## 2020-11-23 DIAGNOSIS — O0990 Supervision of high risk pregnancy, unspecified, unspecified trimester: Secondary | ICD-10-CM | POA: Diagnosis not present

## 2020-11-23 DIAGNOSIS — R030 Elevated blood-pressure reading, without diagnosis of hypertension: Secondary | ICD-10-CM | POA: Diagnosis present

## 2020-11-23 DIAGNOSIS — Z362 Encounter for other antenatal screening follow-up: Secondary | ICD-10-CM | POA: Diagnosis not present

## 2020-11-23 DIAGNOSIS — O99212 Obesity complicating pregnancy, second trimester: Secondary | ICD-10-CM | POA: Insufficient documentation

## 2020-11-23 DIAGNOSIS — Z7982 Long term (current) use of aspirin: Secondary | ICD-10-CM | POA: Insufficient documentation

## 2020-11-23 DIAGNOSIS — Z3689 Encounter for other specified antenatal screening: Secondary | ICD-10-CM

## 2020-11-23 DIAGNOSIS — O99333 Smoking (tobacco) complicating pregnancy, third trimester: Secondary | ICD-10-CM | POA: Diagnosis not present

## 2020-11-23 DIAGNOSIS — E669 Obesity, unspecified: Secondary | ICD-10-CM | POA: Diagnosis not present

## 2020-11-23 DIAGNOSIS — O10919 Unspecified pre-existing hypertension complicating pregnancy, unspecified trimester: Secondary | ICD-10-CM

## 2020-11-23 DIAGNOSIS — Z3A35 35 weeks gestation of pregnancy: Secondary | ICD-10-CM

## 2020-11-23 LAB — URINALYSIS, ROUTINE W REFLEX MICROSCOPIC
Bilirubin Urine: NEGATIVE
Glucose, UA: NEGATIVE mg/dL
Hgb urine dipstick: NEGATIVE
Ketones, ur: 5 mg/dL — AB
Nitrite: NEGATIVE
Protein, ur: 30 mg/dL — AB
Specific Gravity, Urine: 1.029 (ref 1.005–1.030)
pH: 6 (ref 5.0–8.0)

## 2020-11-23 LAB — COMPREHENSIVE METABOLIC PANEL
ALT: 16 U/L (ref 0–44)
AST: 22 U/L (ref 15–41)
Albumin: 2.7 g/dL — ABNORMAL LOW (ref 3.5–5.0)
Alkaline Phosphatase: 93 U/L (ref 38–126)
Anion gap: 9 (ref 5–15)
BUN: 8 mg/dL (ref 6–20)
CO2: 21 mmol/L — ABNORMAL LOW (ref 22–32)
Calcium: 9.2 mg/dL (ref 8.9–10.3)
Chloride: 105 mmol/L (ref 98–111)
Creatinine, Ser: 0.74 mg/dL (ref 0.44–1.00)
GFR, Estimated: >60 mL/min (ref >60)
Glucose, Bld: 109 mg/dL — ABNORMAL HIGH (ref 70–99)
Potassium: 3.6 mmol/L (ref 3.5–5.1)
Sodium: 135 mmol/L (ref 135–145)
Total Bilirubin: 0.3 mg/dL (ref 0.3–1.2)
Total Protein: 6.1 g/dL — ABNORMAL LOW (ref 6.5–8.1)

## 2020-11-23 LAB — CBC
HCT: 33.3 % — ABNORMAL LOW (ref 36.0–46.0)
Hemoglobin: 10.8 g/dL — ABNORMAL LOW (ref 12.0–15.0)
MCH: 28.9 pg (ref 26.0–34.0)
MCHC: 32.4 g/dL (ref 30.0–36.0)
MCV: 89 fL (ref 80.0–100.0)
Platelets: 237 10*3/uL (ref 150–400)
RBC: 3.74 MIL/uL — ABNORMAL LOW (ref 3.87–5.11)
RDW: 15.1 % (ref 11.5–15.5)
WBC: 11.2 10*3/uL — ABNORMAL HIGH (ref 4.0–10.5)
nRBC: 0 % (ref 0.0–0.2)

## 2020-11-23 LAB — PROTEIN / CREATININE RATIO, URINE
Creatinine, Urine: 256.6 mg/dL
Protein Creatinine Ratio: 0.1 mg/mg{Cre} (ref 0.00–0.15)
Total Protein, Urine: 25 mg/dL

## 2020-11-23 NOTE — MAU Note (Signed)
Pt felt nauseated and increased pelvic pressure. B/P was up 141/91. Had a headache earlier this afternoon but took a nap and  tylenol and it went away., but feels a litle light headed. Saw stars last nigh when she got up to go to [facility]. B/P was normal at MD office earlier today.  Good fetal movement felt most of the day a little less the past 2 hrs.

## 2020-11-23 NOTE — MAU Provider Note (Signed)
History     CSN: [id number]  Arrival date and time: 11/23/20 1748  Event Date/Time  First Provider Initiated Contact with Patient 11/23/20 1849     Chief Complaint  Patient presents with   Hypertension   HPI Andrea Burns is a 35 y.o. F6O1308 at [redacted]w[redacted]d who presents to MAU with concern for blood pressure exacerbation. Her pregnancy is complicated by Chronic Hypertension, managed with Labetalol 750 mg BID and Procardia XL 60 mg. She is compliant with this regimen.  Patient denies pain on arrival to MAU but c/o headache earlier today. She took her blood pressure on her home cuff and obtained a reading of 141/91. Her headache resolved with Tylenol and she was able to nap but states she woke up feeling dizzy and tired and is concerned she has developed Preeclampsia.  She denies visual disturbances, RUQ/epigastric pain, new onset swelling or weight gain. She denies vaginal bleeding, leaking of fluid, decreased fetal movement, fever, falls, or recent illness.   Patient receives care with [facility].  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   Medical history non-contributory    Pneumonia    2013    Past Surgical History:  Procedure Laterality Date   APPENDECTOMY     COLPOSCOPY W/ BIOPSY / CURETTAGE     IUD REMOVAL     LAPAROSCOPIC APPENDECTOMY N/A 10/18/2019   Procedure: APPENDECTOMY LAPAROSCOPIC;  Surgeon: [name], MD;  Location: [facility];  Service: General;  Laterality: N/A;   LAPAROSCOPY ABDOMEN DIAGNOSTIC     Removal of migrated IUD    NO PAST SURGERIES      Family History  Problem Relation Age of Onset    Diabetes Mother    Hypertension Mother    Cancer Mother    Diabetes Father    Cancer Paternal Grandmother        liver & lung    Social History   Tobacco Use   Smoking status: Former    Packs/day: 0.25    Years: 1.00    Pack years: 0.25    Types: Cigarettes    Quit date: 05/03/2005    Years since quitting: 15.5   Smokeless tobacco: Never  Vaping Use   Vaping Use: Never used  Substance Use Topics   Alcohol use: Not Currently    Alcohol/week: 0.0 standard drinks   Drug use: Not Currently    Types: Cocaine    Comment: last used 3 to 4 months ago    Allergies: No Known Allergies  Medications Prior to Admission  Medication Sig Dispense Refill Last Dose   albuterol (VENTOLIN HFA) 108 (90 Base) MCG/ACT inhaler Inhale 2 puffs into the lungs every 4 (four) hours as needed for wheezing or shortness of breath. 18 g 0 Past Month   aspirin EC 81 MG tablet Take 1 tablet (81 mg  total) by mouth daily. Take after 12 weeks for prevention of preeclampsia later in pregnancy 300 tablet 2 11/23/2020   Ferrous Sulfate (IRON PO) Take 1 tablet by mouth daily.   11/23/2020   labetalol (NORMODYNE) 300 MG tablet Take 2.5 tablets (750 mg total) by mouth 2 (two) times daily. 450 tablet 3 11/23/2020   NIFEdipine (PROCARDIA XL/NIFEDICAL XL) 60 MG 24 hr tablet Take 1 tablet (60 mg total) by mouth daily. 90 tablet 3 11/23/2020   Prenatal Vit-Fe Fumarate-FA (PRENATAL MULTIVITAMIN) TABS tablet Take 1 tablet by mouth daily at 12 noon.   11/23/2020   butalbital-acetaminophen-caffeine (FIORICET) 50-325-40 MG tablet Take 1 tablet by mouth every 6 (six) hours as needed for headache. 10 tablet 3    cefadroxil (DURICEF) 500 MG capsule Take 1 capsule (500 mg total) by mouth 2 (two) times daily. (Patient not taking: No sig reported) 14 capsule 0    docusate sodium (COLACE) 100 MG capsule Take 1 capsule (100 mg total) by mouth 2 (two) times daily as needed. 30 capsule 2    ferrous sulfate (FERROUSUL) 325 (65 FE) MG tablet  Take 1 tablet (325 mg total) by mouth 2 (two) times daily. 60 tablet 1    ondansetron (ZOFRAN ODT) 4 MG disintegrating tablet Take 1 tablet (4 mg total) by mouth every 8 (eight) hours as needed. (Patient not taking: No sig reported) 20 tablet 6     Review of Systems  Gastrointestinal:  Negative for abdominal pain.  Genitourinary:  Negative for vaginal bleeding.  Neurological:  Positive for dizziness.  All other systems reviewed and are negative. Physical Exam   Blood pressure 123/74, pulse 79, temperature 97.9 F (36.6 C), resp. rate 18, height 5\' 2"  (1.575 m), weight 93.4 kg, SpO2 98 %.  Physical Exam Vitals and nursing note reviewed. Exam conducted with a chaperone present.  Constitutional:      General: She is in acute distress.     Appearance: Normal appearance. She is obese.  Cardiovascular:     Rate and Rhythm: Normal rate.     Pulses: Normal pulses.     Heart sounds: Normal heart sounds.  Pulmonary:     Effort: Pulmonary effort is normal.     Breath sounds: Normal breath sounds.  Abdominal:     Comments: Gravid  Skin:    Capillary Refill: Capillary refill takes less than 2 seconds.  Neurological:     Mental Status: She is alert and oriented to person, place, and time.  Psychiatric:        Mood and Affect: Mood normal.        Behavior: Behavior normal.        Thought Content: Thought content normal.        Judgment: Judgment normal.    MAU Course  Procedures  --Reactive tracing: baseline 125, mod var, + 15 x 15 accels, no decels --Toco: occasional uterine irritability, otherwise quiet --Pain score 0/10 throughout period of evaluation in MAU --Normotensive throughout period of evaluation. Asymptomatic throughout period of evaluation. Discussed with patient that we typically would monitor blood pressure for one hour without running Preeclampsia labs unless severe symptoms develop. Patient very tearful, verbalizes to CNM she is "terrified", orders placed for PEC labs.  Results WNL. Reassurance provided  Orders Placed This Encounter  Procedures   Culture, OB Urine   Urinalysis, Routine w reflex microscopic Urine, Clean Catch   CBC   Comprehensive metabolic panel   Protein / creatinine ratio, urine   Discharge patient  Results for orders placed or performed during the hospital encounter of 11/23/20 (from the past 24 hour(s))  Urinalysis, Routine w reflex microscopic Urine, Clean Catch     Status: Abnormal   Collection Time: 11/23/20  5:48 PM  Result Value Ref Range   Color, Urine YELLOW YELLOW   APPearance HAZY (A) CLEAR   Specific Gravity, Urine 1.029 1.005 - 1.030   pH 6.0 5.0 - 8.0   Glucose, UA NEGATIVE NEGATIVE mg/dL   Hgb urine dipstick NEGATIVE NEGATIVE   Bilirubin Urine NEGATIVE NEGATIVE   Ketones, ur 5 (A) NEGATIVE mg/dL   Protein, ur 30 (A) NEGATIVE mg/dL   Nitrite NEGATIVE NEGATIVE   Leukocytes,Ua SMALL (A) NEGATIVE   RBC / HPF 0-5 0 - 5 RBC/hpf   WBC, UA 6-10 0 - 5 WBC/hpf   Bacteria, UA FEW (A) NONE SEEN   Squamous Epithelial / LPF 6-10 0 - 5   Mucus PRESENT   Protein / creatinine ratio, urine     Status: None   Collection Time: 11/23/20  5:48 PM  Result Value Ref Range   Creatinine, Urine 256.60 mg/dL   Total Protein, Urine 25 mg/dL   Protein Creatinine Ratio 0.10 0.00 - 0.15 mg/mg[Cre]  CBC     Status: Abnormal   Collection Time: 11/23/20  7:01 PM  Result Value Ref Range   WBC 11.2 (H) 4.0 - 10.5 K/uL   RBC 3.74 (L) 3.87 - 5.11 MIL/uL   Hemoglobin 10.8 (L) 12.0 - 15.0 g/dL   HCT 62.8 (L) 31.5 - 17.6 %   MCV 89.0 80.0 - 100.0 fL   MCH 28.9 26.0 - 34.0 pg   MCHC 32.4 30.0 - 36.0 g/dL   RDW 16.0 73.7 - 10.6 %   Platelets 237 150 - 400 K/uL   nRBC 0.0 0.0 - 0.2 %  Comprehensive metabolic panel     Status: Abnormal   Collection Time: 11/23/20  7:01 PM  Result Value Ref Range   Sodium 135 135 - 145 mmol/L   Potassium 3.6 3.5 - 5.1 mmol/L   Chloride 105 98 - 111 mmol/L   CO2 21 (L) 22 - 32 mmol/L   Glucose, Bld 109  (H) 70 - 99 mg/dL   BUN 8 6 - 20 mg/dL   Creatinine, Ser 2.69 0.44 - 1.00 mg/dL   Calcium 9.2 8.9 - 48.5 mg/dL   Total Protein 6.1 (L) 6.5 - 8.1 g/dL   Albumin 2.7 (L) 3.5 - 5.0 g/dL   AST 22 15 - 41 U/L   ALT 16 0 - 44 U/L   Alkaline Phosphatase 93 38 - 126 U/L   Total Bilirubin 0.3 0.3 - 1.2 mg/dL   GFR, Estimated >46 >27 mL/min   Anion gap 9 5 - 15   Assessment and Plan  --35 y.o. [id number] at [redacted]w[redacted]d  --Chronic Hypertension on Labetalol and Procardia --Preeclampsia labs normal --Reactive tracing --Cervix tight 1 cm/thick/posterior, unchanged from previous on 10/24/2020 --Pain score 0/10 throughout --Discharge home in stable condition  F/U: --BPP scheduled for 06/22 --Next [facility] appointment is 06/23  Calvert Cantor, CNM

## 2020-11-25 LAB — CULTURE, OB URINE: Culture: <10000 — AB

## 2020-11-29 ENCOUNTER — Ambulatory Visit (INDEPENDENT_AMBULATORY_CARE_PROVIDER_SITE_OTHER): Payer: Medicaid Other | Admitting: General Practice

## 2020-11-29 ENCOUNTER — Other Ambulatory Visit: Payer: Self-pay

## 2020-11-29 ENCOUNTER — Ambulatory Visit (INDEPENDENT_AMBULATORY_CARE_PROVIDER_SITE_OTHER): Payer: Medicaid Other

## 2020-11-29 VITALS — BP 138/81 | HR 98

## 2020-11-29 DIAGNOSIS — Z3A36 36 weeks gestation of pregnancy: Secondary | ICD-10-CM

## 2020-11-29 DIAGNOSIS — O0990 Supervision of high risk pregnancy, unspecified, unspecified trimester: Secondary | ICD-10-CM

## 2020-11-29 DIAGNOSIS — O10919 Unspecified pre-existing hypertension complicating pregnancy, unspecified trimester: Secondary | ICD-10-CM

## 2020-11-29 NOTE — Progress Notes (Signed)
Pt informed that the ultrasound is considered a limited OB ultrasound and is not intended to be a complete ultrasound exam.  Patient also informed that the ultrasound is not being completed with the intent of assessing for fetal or placental anomalies or any pelvic abnormalities.  Explained that the purpose of today's ultrasound is to assess for  BPP, presentation, and AFI.  Patient acknowledges the purpose of the exam and the limitations of the study.     Chase Caller RN BSN 11/29/20

## 2020-11-30 ENCOUNTER — Encounter: Payer: Self-pay | Admitting: Obstetrics and Gynecology

## 2020-11-30 ENCOUNTER — Other Ambulatory Visit (HOSPITAL_COMMUNITY): Payer: Self-pay | Admitting: Advanced Practice Midwife

## 2020-11-30 ENCOUNTER — Ambulatory Visit (INDEPENDENT_AMBULATORY_CARE_PROVIDER_SITE_OTHER): Payer: Medicaid Other | Admitting: Obstetrics and Gynecology

## 2020-11-30 ENCOUNTER — Other Ambulatory Visit (HOSPITAL_COMMUNITY)
Admission: RE | Admit: 2020-11-30 | Discharge: 2020-11-30 | Disposition: A | Discharge status: Home / Self Care | Payer: Medicaid Other | Source: Ambulatory Visit | Attending: Obstetrics and Gynecology | Admitting: Obstetrics and Gynecology

## 2020-11-30 VITALS — BP 137/86 | HR 89 | Wt 208.0 lb

## 2020-11-30 DIAGNOSIS — O0990 Supervision of high risk pregnancy, unspecified, unspecified trimester: Secondary | ICD-10-CM | POA: Diagnosis not present

## 2020-11-30 DIAGNOSIS — O10919 Unspecified pre-existing hypertension complicating pregnancy, unspecified trimester: Secondary | ICD-10-CM

## 2020-11-30 LAB — CERVICOVAGINAL ANCILLARY ONLY
Chlamydia: NEGATIVE
Comment: NEGATIVE
Comment: NEGATIVE
Comment: NORMAL
Neisseria Gonorrhea: NEGATIVE
Trichomonas: NEGATIVE

## 2020-11-30 NOTE — Progress Notes (Signed)
Subjective:  Andrea Burns is a 35 y.o. [id number] at [redacted]w[redacted]d being seen today for ongoing prenatal care.  She is currently monitored for the following issues for this high-risk pregnancy and has Depression affecting pregnancy; Chronic hypertension affecting pregnancy; Supervision of high risk pregnancy, antepartum; History of substance abuse (HCC); and Chronic migraine w/o aura w/o status migrainosus, not intractable on their problem list.  Patient reports general discomforts of pregnancy. Denies HA or visual changes.  Contractions: Irritability. Vag. Bleeding: None.  Movement: Present. Denies leaking of fluid.   The following portions of the patient's history were reviewed and updated as appropriate: allergies, current medications, past family history, past medical history, past social history, past surgical history and problem list. Problem list updated.  Objective:   Vitals:   11/30/20 0927  BP: 137/86  Pulse: 89  Weight: 208 lb (94.3 kg)    Fetal Status: Fetal Heart Rate (bpm): 153   Movement: Present     General:  Alert, oriented and cooperative. Patient is in no acute distress.  Skin: Skin is warm and dry. No rash noted.   Cardiovascular: Normal heart rate noted  Respiratory: Normal respiratory effort, no problems with respiration noted  Abdomen: Soft, gravid, appropriate for gestational age. Pain/Pressure: Present     Pelvic:  Cervical exam performed        Extremities: Normal range of motion.  Edema: Trace  Mental Status: Normal mood and affect. Normal behavior. Normal judgment and thought content.   Urinalysis:      Assessment and Plan:  Pregnancy: [id number] at [redacted]w[redacted]d  1. Supervision of high risk pregnancy, antepartum GBS today Labor precautions  2. Chronic hypertension affecting pregnancy Growth 77 % on 11/23/2020 BP stable, continue with current treatment Weekly antenatal testing until delivery IOL scheduled   Term labor symptoms and general obstetric precautions  including but not limited to vaginal bleeding, contractions, leaking of fluid and fetal movement were reviewed in detail with the patient. Please refer to After Visit Summary for other counseling recommendations.  Return in about 1 week (around 12/07/2020) for OB visit, face to face, MD only.   Hermina Staggers, MD

## 2020-11-30 NOTE — Patient Instructions (Signed)
Vaginal Delivery ?Vaginal delivery means that you give birth by pushing your baby out of your birth canal (vagina). Your health care team will help you before, during, and after vaginal delivery. ?Birth experiences are unique for every woman and every pregnancy, and birth experiences vary depending on where you choose to give birth. ?What are the risks and benefits? ?Generally, this is safe. However, problems may occur, including: ?Bleeding. ?Infection. ?Damage to other structures such as vaginal tearing. ?Allergic reactions to medicines. ?Despite the risks, benefits of vaginal delivery include less risk of bleeding and infection and a shorter recovery time compared to a Cesarean delivery. Cesarean delivery, or C-section, is the surgical delivery of a baby. ?What happens when I arrive at the birth center or hospital? ?Once you are in labor and have been admitted into the hospital or birth center, your health care team may: ?Review your pregnancy history and any concerns that you have. ?Talk with you about your birth plan and discuss pain control options. ?Check your blood pressure, breathing, and heartbeat. ?Assess your baby's heartbeat. ?Monitor your uterus for contractions. ?Check whether your bag of water (amniotic sac) has broken (ruptured). ?Insert an IV into one of your veins. This may be used to give you fluids and medicines. ?Monitoring ?Your health care team may assess your contractions (uterine monitoring) and your baby's heart rate (fetal monitoring). You may need to be monitored: ?Often, but not continuously (intermittently). ?All the time or for long periods at a time (continuously). Continuous monitoring may be needed if: ?You are taking certain medicines, such as medicine to relieve pain or make your contractions stronger. ?You have pregnancy or labor complications. ?Monitoring may be done by: ?Placing a special stethoscope or a handheld monitoring device on your abdomen to check your baby's heartbeat  and to check for contractions. ?Placing monitors on your abdomen (external monitors) to record your baby's heartbeat and the frequency and length of contractions. ?Placing monitors inside your uterus through your vagina (internal monitors) to record your baby's heartbeat and the frequency, length, and strength of your contractions. Depending on the type of monitor, it may remain in your uterus or on your baby's head until birth. ?Telemetry. This is a type of continuous monitoring that can be done with external or internal monitors. Instead of having to stay in bed, you are able to move around. ?Physical exam ?Your health care team may perform frequent physical exams. This may include: ?Checking how and where your baby is positioned in your uterus. ?Checking your cervix to determine: ?Whether it is thinning out (effacing). ?Whether it is opening up (dilating). ?What happens during labor and delivery? ?Normal labor and delivery is divided into the following three stages: ?Stage 1 ?This is the longest stage of labor. ?Throughout this stage, you will feel contractions. Contractions generally feel mild, infrequent, and irregular at first. They get stronger, more frequent, and more regular as you move through this stage. You may have contractions about every 2-3 minutes. ?This stage ends when your cervix is completely dilated to 4 inches (10 cm) and completely effaced. ?Stage 2 ?This stage starts once your cervix is completely effaced and dilated and lasts until the delivery of your baby. ?This is the stage where you will feel an urge to push your baby out of your vagina. ?You may feel stretching and burning pain, especially when the widest part of your baby's head passes through the vaginal opening (crowning). ?Once your baby is delivered, the umbilical cord will be clamped and   cut. Timing of cutting the cord will depend on your wishes, your baby's health, and your health care provider's practices. ?Your baby will be  placed on your bare chest (skin-to-skin contact) in an upright position and covered with a warm blanket. If you are choosing to breastfeed, watch your baby for feeding cues, like rooting or sucking, and help the baby to your breast for his or her first feeding. ?Stage 3 ?This stage starts immediately after the birth of your baby and ends after you deliver the placenta. ?This stage may take anywhere from 5 to 30 minutes. ?After your baby has been delivered, you will feel contractions as your body expels the placenta. These contractions also help your uterus get smaller and reduce bleeding. ?What can I expect after labor and delivery? ?After labor is over, you and your baby will be assessed closely until you are ready to go home. Your health care team will teach you how to care for yourself and your baby. ?You and your baby may be encouraged to stay in the same room (rooming in) during your hospital stay. This will help promote early bonding and successful breastfeeding. ?Your uterus will be checked and massaged regularly (fundal massage). ?You may continue to receive fluids and medicines through an IV. ?You will have some soreness and pain in your abdomen, vagina, and the area of skin between your vaginal opening and your anus (perineum). ?If an incision was made near your vagina (episiotomy) or if you had some vaginal tearing during delivery, cold compresses may be placed on your episiotomy or your tear. This helps to reduce pain and swelling. ?It is normal to have vaginal bleeding after delivery. Wear a sanitary pad for vaginal bleeding and discharge. ?Summary ?Vaginal delivery means that you will give birth by pushing your baby out of your birth canal (vagina). ?Your health care team will monitor you and your baby throughout the stages of labor. ?After you deliver your baby, your health care team will continue to assess you and your baby to ensure you are both recovering as expected after delivery. ?This  information is not intended to replace advice given to you by your health care provider. Make sure you discuss any questions you have with your health care provider. ?Document Revised: 04/24/2020 Document Reviewed: 04/24/2020 ?Elsevier Patient Education ? 2022 [employer] ? ?

## 2020-11-30 NOTE — Progress Notes (Signed)
+   Fetal movement. Pt c/o severe fatigue and nausea.

## 2020-12-02 LAB — STREP GP B NAA: Strep Gp B NAA: POSITIVE — AB

## 2020-12-04 ENCOUNTER — Telehealth (HOSPITAL_COMMUNITY): Payer: Self-pay | Admitting: *Deleted

## 2020-12-04 ENCOUNTER — Encounter (HOSPITAL_COMMUNITY): Payer: Self-pay | Admitting: *Deleted

## 2020-12-04 NOTE — Telephone Encounter (Signed)
Preadmission screen  

## 2020-12-05 ENCOUNTER — Ambulatory Visit: Payer: Medicaid Other | Admitting: *Deleted

## 2020-12-05 ENCOUNTER — Other Ambulatory Visit: Payer: Self-pay

## 2020-12-05 ENCOUNTER — Ambulatory Visit (INDEPENDENT_AMBULATORY_CARE_PROVIDER_SITE_OTHER): Payer: Medicaid Other

## 2020-12-05 VITALS — BP 128/78 | HR 85

## 2020-12-05 DIAGNOSIS — Z3A36 36 weeks gestation of pregnancy: Secondary | ICD-10-CM

## 2020-12-05 DIAGNOSIS — O10919 Unspecified pre-existing hypertension complicating pregnancy, unspecified trimester: Secondary | ICD-10-CM

## 2020-12-05 NOTE — Progress Notes (Signed)

## 2020-12-07 ENCOUNTER — Other Ambulatory Visit: Payer: Self-pay

## 2020-12-07 ENCOUNTER — Encounter: Payer: Self-pay | Admitting: Obstetrics

## 2020-12-07 ENCOUNTER — Ambulatory Visit (INDEPENDENT_AMBULATORY_CARE_PROVIDER_SITE_OTHER): Payer: Medicaid Other | Admitting: Obstetrics

## 2020-12-07 VITALS — BP 124/80 | HR 83 | Wt 205.2 lb

## 2020-12-07 DIAGNOSIS — F1911 Other psychoactive substance abuse, in remission: Secondary | ICD-10-CM

## 2020-12-07 DIAGNOSIS — O0990 Supervision of high risk pregnancy, unspecified, unspecified trimester: Secondary | ICD-10-CM

## 2020-12-07 DIAGNOSIS — O10919 Unspecified pre-existing hypertension complicating pregnancy, unspecified trimester: Secondary | ICD-10-CM

## 2020-12-07 NOTE — Progress Notes (Signed)
Pt presents for ROB reqs cx check.  IOL scheduled on Sunday.

## 2020-12-07 NOTE — Progress Notes (Addendum)
Subjective:  Andrea Burns is a 35 y.o. [id number] at [redacted]w[redacted]d being seen today for ongoing prenatal care.  She is currently monitored for the following issues for this high-risk pregnancy and has Depression affecting pregnancy; Chronic hypertension affecting pregnancy; Supervision of high risk pregnancy, antepartum; History of substance abuse (HCC); and Chronic migraine w/o aura w/o status migrainosus, not intractable on their problem list.  Patient reports backache.  Contractions: Irritability. Vag. Bleeding: None.  Movement: Present. Denies leaking of fluid.   The following portions of the patient's history were reviewed and updated as appropriate: allergies, current medications, past family history, past medical history, past social history, past surgical history and problem list. Problem list updated.  Objective:   Vitals:   12/07/20 1000  BP: 124/80  Pulse: 83  Weight: 205 lb 3.2 oz (93.1 kg)    Fetal Status:     Movement: Present     General:  Alert, oriented and cooperative. Patient is in no acute distress.  Skin: Skin is warm and dry. No rash noted.   Cardiovascular: Normal heart rate noted  Respiratory: Normal respiratory effort, no problems with respiration noted  Abdomen: Soft, gravid, appropriate for gestational age. Pain/Pressure: Present     Pelvic:  Cervical exam performed      1-2 cm / 50 % / -3 / Vtx  Extremities: Normal range of motion.  Edema: Trace  Mental Status: Normal mood and affect. Normal behavior. Normal judgment and thought content.   Urinalysis:      Assessment and Plan:  Pregnancy: [id number] at [redacted]w[redacted]d  1. Supervision of high risk pregnancy, antepartum  2. Chronic hypertension affecting pregnancy - blood pressure clinically stable on meds  3. History of substance abuse (HCC)     Term labor symptoms and general obstetric precautions including but not limited to vaginal bleeding, contractions, leaking of fluid and fetal movement were reviewed in detail  with the patient. Please refer to After Visit Summary for other counseling recommendations.   Return in about 2 weeks (around 12/21/2020) for postpartum visit.   Brock Bad, MD 12/07/2020 3:28 PM

## 2020-12-08 ENCOUNTER — Other Ambulatory Visit (HOSPITAL_COMMUNITY)
Admission: RE | Admit: 2020-12-08 | Discharge: 2020-12-08 | Disposition: A | Discharge status: Home / Self Care | Payer: Medicaid Other | Source: Ambulatory Visit | Attending: Obstetrics and Gynecology | Admitting: Obstetrics and Gynecology

## 2020-12-08 DIAGNOSIS — Z01812 Encounter for preprocedural laboratory examination: Secondary | ICD-10-CM | POA: Insufficient documentation

## 2020-12-08 DIAGNOSIS — Z20822 Contact with and (suspected) exposure to covid-19: Secondary | ICD-10-CM | POA: Insufficient documentation

## 2020-12-08 DIAGNOSIS — Z419 Encounter for procedure for purposes other than remedying health state, unspecified: Secondary | ICD-10-CM | POA: Diagnosis not present

## 2020-12-08 LAB — SARS CORONAVIRUS 2 (TAT 6-24 HRS): SARS Coronavirus 2: NEGATIVE

## 2020-12-10 ENCOUNTER — Encounter (HOSPITAL_COMMUNITY): Payer: Self-pay | Admitting: Obstetrics and Gynecology

## 2020-12-10 ENCOUNTER — Inpatient Hospital Stay (HOSPITAL_COMMUNITY)
Admission: AD | Admit: 2020-12-10 | Discharge: 2020-12-12 | DRG: 798 | Disposition: A | Discharge status: Home / Self Care | Payer: Medicaid Other | Attending: Obstetrics and Gynecology | Admitting: Obstetrics and Gynecology

## 2020-12-10 ENCOUNTER — Inpatient Hospital Stay (HOSPITAL_COMMUNITY): Payer: Medicaid Other

## 2020-12-10 ENCOUNTER — Other Ambulatory Visit: Payer: Self-pay

## 2020-12-10 DIAGNOSIS — Z3A37 37 weeks gestation of pregnancy: Secondary | ICD-10-CM

## 2020-12-10 DIAGNOSIS — Z302 Encounter for sterilization: Secondary | ICD-10-CM

## 2020-12-10 DIAGNOSIS — Z9079 Acquired absence of other genital organ(s): Secondary | ICD-10-CM

## 2020-12-10 DIAGNOSIS — O0990 Supervision of high risk pregnancy, unspecified, unspecified trimester: Secondary | ICD-10-CM

## 2020-12-10 DIAGNOSIS — O99820 Streptococcus B carrier state complicating pregnancy: Secondary | ICD-10-CM | POA: Diagnosis not present

## 2020-12-10 DIAGNOSIS — O119 Pre-existing hypertension with pre-eclampsia, unspecified trimester: Secondary | ICD-10-CM | POA: Diagnosis not present

## 2020-12-10 DIAGNOSIS — O99824 Streptococcus B carrier state complicating childbirth: Secondary | ICD-10-CM | POA: Diagnosis not present

## 2020-12-10 DIAGNOSIS — O1002 Pre-existing essential hypertension complicating childbirth: Principal | ICD-10-CM | POA: Diagnosis present

## 2020-12-10 DIAGNOSIS — Z87891 Personal history of nicotine dependence: Secondary | ICD-10-CM | POA: Diagnosis not present

## 2020-12-10 DIAGNOSIS — O10919 Unspecified pre-existing hypertension complicating pregnancy, unspecified trimester: Secondary | ICD-10-CM

## 2020-12-10 LAB — CBC
HCT: 34.2 % — ABNORMAL LOW (ref 36.0–46.0)
Hemoglobin: 11.5 g/dL — ABNORMAL LOW (ref 12.0–15.0)
MCH: 29.4 pg (ref 26.0–34.0)
MCHC: 33.6 g/dL (ref 30.0–36.0)
MCV: 87.5 fL (ref 80.0–100.0)
Platelets: 233 10*3/uL (ref 150–400)
RBC: 3.91 MIL/uL (ref 3.87–5.11)
RDW: 15 % (ref 11.5–15.5)
WBC: 9.9 10*3/uL (ref 4.0–10.5)
nRBC: 0 % (ref 0.0–0.2)

## 2020-12-10 LAB — TYPE AND SCREEN
ABO/RH(D): A POS
Antibody Screen: NEGATIVE

## 2020-12-10 LAB — COMPREHENSIVE METABOLIC PANEL
ALT: 15 U/L (ref 0–44)
AST: 19 U/L (ref 15–41)
Albumin: 2.8 g/dL — ABNORMAL LOW (ref 3.5–5.0)
Alkaline Phosphatase: 99 U/L (ref 38–126)
Anion gap: 6 (ref 5–15)
BUN: 7 mg/dL (ref 6–20)
CO2: 20 mmol/L — ABNORMAL LOW (ref 22–32)
Calcium: 9.2 mg/dL (ref 8.9–10.3)
Chloride: 109 mmol/L (ref 98–111)
Creatinine, Ser: 0.73 mg/dL (ref 0.44–1.00)
GFR, Estimated: >60 mL/min (ref >60)
Glucose, Bld: 90 mg/dL (ref 70–99)
Potassium: 3.8 mmol/L (ref 3.5–5.1)
Sodium: 135 mmol/L (ref 135–145)
Total Bilirubin: 0.4 mg/dL (ref 0.3–1.2)
Total Protein: 6.2 g/dL — ABNORMAL LOW (ref 6.5–8.1)

## 2020-12-10 LAB — PROTEIN / CREATININE RATIO, URINE
Creatinine, Urine: 369.28 mg/dL
Protein Creatinine Ratio: 0.06 mg/mg{Cre} (ref 0.00–0.15)
Total Protein, Urine: 22 mg/dL

## 2020-12-10 LAB — RAPID URINE DRUG SCREEN, HOSP PERFORMED
Amphetamines: NOT DETECTED
Barbiturates: NOT DETECTED
Benzodiazepines: NOT DETECTED
Cocaine: NOT DETECTED
Opiates: NOT DETECTED
Tetrahydrocannabinol: NOT DETECTED

## 2020-12-10 MED ORDER — LACTATED RINGERS IV SOLN: 500.0000 mL | INTRAVENOUS | Status: DC | PRN

## 2020-12-10 MED ORDER — TERBUTALINE SULFATE 1 MG/ML IJ SOLN: 0.2500 mg | Freq: Once | INTRAMUSCULAR | Status: DC | PRN

## 2020-12-10 MED ORDER — PENICILLIN G POT IN DEXTROSE 60000 UNIT/ML IV SOLN
3.0000 10*6.[IU] | INTRAVENOUS | Status: DC
  Administered 2020-12-10 (×2): 3 10*6.[IU] via INTRAVENOUS
  Filled 2020-12-10 (×4): qty 50

## 2020-12-10 MED ORDER — OXYCODONE-ACETAMINOPHEN 5-325 MG PO TABS: 1.0000 | ORAL_TABLET | ORAL | Status: DC | PRN

## 2020-12-10 MED ORDER — MISOPROSTOL 25 MCG QUARTER TABLET
25.0000 ug | ORAL_TABLET | ORAL | Status: DC | PRN
  Administered 2020-12-10: 25 ug via VAGINAL
  Filled 2020-12-10 (×2): qty 1

## 2020-12-10 MED ORDER — OXYCODONE-ACETAMINOPHEN 5-325 MG PO TABS: 2.0000 | ORAL_TABLET | ORAL | Status: DC | PRN

## 2020-12-10 MED ORDER — LIDOCAINE HCL (PF) 1 % IJ SOLN: 30.0000 mL | INTRAMUSCULAR | Status: DC | PRN

## 2020-12-10 MED ORDER — SODIUM CHLORIDE 0.9 % IV SOLN
5.0000 10*6.[IU] | Freq: Once | INTRAVENOUS | Status: AC
  Administered 2020-12-10: 5 10*6.[IU] via INTRAVENOUS
  Filled 2020-12-10: qty 5

## 2020-12-10 MED ORDER — ONDANSETRON HCL 4 MG/2ML IJ SOLN: 4.0000 mg | Freq: Four times a day (QID) | INTRAMUSCULAR | Status: DC | PRN

## 2020-12-10 MED ORDER — NIFEDIPINE ER OSMOTIC RELEASE 30 MG PO TB24: 60.0000 mg | ORAL_TABLET | Freq: Every day | ORAL | Status: DC

## 2020-12-10 MED ORDER — LACTATED RINGERS IV SOLN: INTRAVENOUS | Status: DC

## 2020-12-10 MED ORDER — LABETALOL HCL 200 MG PO TABS
750.0000 mg | ORAL_TABLET | Freq: Two times a day (BID) | ORAL | Status: DC
  Administered 2020-12-11: 750 mg via ORAL
  Filled 2020-12-10: qty 3

## 2020-12-10 MED ORDER — SOD CITRATE-CITRIC ACID 500-334 MG/5ML PO SOLN: 30.0000 mL | ORAL | Status: DC | PRN

## 2020-12-10 MED ORDER — OXYTOCIN-SODIUM CHLORIDE 30-0.9 UT/500ML-% IV SOLN
2.5000 [IU]/h | INTRAVENOUS | Status: DC
  Filled 2020-12-10: qty 500

## 2020-12-10 MED ORDER — ACETAMINOPHEN 325 MG PO TABS: 650.0000 mg | ORAL_TABLET | ORAL | Status: DC | PRN

## 2020-12-10 MED ORDER — OXYTOCIN-SODIUM CHLORIDE 30-0.9 UT/500ML-% IV SOLN
1.0000 m[IU]/min | INTRAVENOUS | Status: DC
  Administered 2020-12-10: 2 m[IU]/min via INTRAVENOUS

## 2020-12-10 MED ORDER — FENTANYL CITRATE (PF) 100 MCG/2ML IJ SOLN
50.0000 ug | INTRAMUSCULAR | Status: DC | PRN
Start: 2020-12-10 — End: 2020-12-11
  Administered 2020-12-10 (×2): 100 ug via INTRAVENOUS
  Filled 2020-12-10 (×2): qty 2

## 2020-12-10 MED ORDER — OXYTOCIN BOLUS FROM INFUSION
333.0000 mL | Freq: Once | INTRAVENOUS | Status: AC
  Administered 2020-12-11: 333 mL via INTRAVENOUS

## 2020-12-10 NOTE — H&P (Addendum)
OBSTETRIC ADMISSION HISTORY AND PHYSICAL  Andrea Burns is a 35 y.o. female [phone] with IUP at [redacted]w[redacted]d by [redacted]w[redacted]d U/S presenting for IOL- cHTN (Labetolol 750 BID, Procardia 60). She reports +FMs, No LOF, no VB, no blurry vision, headaches or peripheral edema, or RUQ pain.  She plans on breast and bottle feeding. She is getting a tubal ligation.  She received her prenatal care at  [facility].    Dating: By [redacted]w[redacted]d U/S --->  Estimated Date of Delivery: 12/27/20  Sono:   11/23/2020@[redacted]w[redacted]d , CWD, normal anatomy, cephalic presentation, anterior placental lie, 2868g, 77% EFW  Prenatal History/Complications:  cHTN (Labetolol and procardia) Depression/Anxiety Hx of substance abuse (32yr ago) Chronic migraine w/o aura  Past Medical History: 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   Medical history non-contributory    Pneumonia    2013   Vaginal Pap smear, abnormal     Past Surgical History: Past Surgical History:  Procedure Laterality Date   APPENDECTOMY     COLPOSCOPY W/ BIOPSY / CURETTAGE     IUD REMOVAL     LAPAROSCOPIC APPENDECTOMY N/A 10/18/2019   Procedure: APPENDECTOMY LAPAROSCOPIC;  Surgeon: [name], MD;  Location: [facility];  Service: General;  Laterality: N/A;   LAPAROSCOPY ABDOMEN DIAGNOSTIC     Removal of migrated IUD     Obstetrical History: OB History     Gravida  6   Para  4   Term  4   Preterm      AB  1   Living  4      SAB  1   IAB      Ectopic      Multiple      Live Births  4           Social History Social History   Socioeconomic History   Marital status: Married    Spouse name: Andrea Burns   Number of children: 4   Years of education: Not on file   Highest education level: Not on file  Occupational History   Not on file  Tobacco  Use   Smoking status: Former    Packs/day: 0.25    Years: 1.00    Pack years: 0.25    Types: Cigarettes    Quit date: 05/03/2005    Years since quitting: 15.6   Smokeless tobacco: Never  Vaping Use   Vaping Use: Never used  Substance and Sexual Activity   Alcohol use: Not Currently    Alcohol/week: 0.0 standard drinks   Drug use: Not Currently    Types: Cocaine    Comment: last used 3 to 4 months ago   Sexual activity: Yes    Partners: Male    Birth control/protection: None  Other Topics Concern   Not on file  Social History Narrative   Not on file   Social Determinants of Health   Financial Resource Strain: Not on file  Food Insecurity: Food Insecurity Present   Worried About Running Out of Food in the Last Year: Sometimes true   Ran Out of Food in the Last Year: Sometimes true  Transportation Needs: No Transportation Needs   Lack of Transportation (Medical): No   Lack of Transportation (Non-Medical): No  Physical Activity:  Not on file  Stress: Not on file  Social Connections: Not on file    Family History: Family History  Problem Relation Age of Onset   Diabetes Mother    Hypertension Mother    Cancer Mother    Diabetes Father    Cancer Paternal Grandmother        liver & lung    Allergies: No Known Allergies  Pt denies allergies to latex, iodine, or shellfish.  Medications Prior to Admission  Medication Sig Dispense Refill Last Dose   albuterol (VENTOLIN HFA) 108 (90 Base) MCG/ACT inhaler Inhale 2 puffs into the lungs every 4 (four) hours as needed for wheezing or shortness of breath. 18 g 0 Past Week   aspirin EC 81 MG tablet Take 1 tablet (81 mg total) by mouth daily. Take after 12 weeks for prevention of preeclampsia later in pregnancy 300 tablet 2 12/10/2020   ferrous sulfate (FERROUSUL) 325 (65 FE) MG tablet Take 1 tablet (325 mg total) by mouth 2 (two) times daily. 60 tablet 1 12/09/2020   labetalol (NORMODYNE) 300 MG tablet Take 2.5 tablets (750 mg  total) by mouth 2 (two) times daily. 450 tablet 3 12/10/2020   NIFEdipine (PROCARDIA XL/NIFEDICAL XL) 60 MG 24 hr tablet Take 1 tablet (60 mg total) by mouth daily. 90 tablet 3 12/10/2020   Prenatal Vit-Fe Fumarate-FA (PRENATAL MULTIVITAMIN) TABS tablet Take 1 tablet by mouth daily at 12 noon.   12/09/2020   docusate sodium (COLACE) 100 MG capsule Take 1 capsule (100 mg total) by mouth 2 (two) times daily as needed. 30 capsule 2      Review of Systems   All systems reviewed and negative except as stated in HPI  Height 5\' 2"  (1.575 m), weight 94.3 kg. General appearance: alert, cooperative, and no distress Lungs: Normal respiratory effort  Heart: regular rate and rhythm Abdomen: soft, non-tender; gravid Pelvic: Stated below Extremities: Homans sign is negative, no sign of DVT Presentation: cephalic [redacted]w[redacted]d [redacted]w[redacted]d  Fetal monitoringBaseline: 140 bpm, Variability: Good {> 6 bpm), Accelerations: Non-reactive but appropriate for gestational age, and Decelerations: Absent Uterine activity intermittent contractions  Dilation: 1.5 Effacement (%): 30 Station: -3 Exam by:: Mary [phone] Johnson, RN  Prenatal labs: ABO, Rh: --/--/PENDING (07/03 1440) Antibody: PENDING (07/03 1440) Rubella: 1.49 (12/10 1123) RPR: Non Reactive (04/26 0933)  HBsAg: Negative (12/10 1123)  HIV: Non Reactive (04/26 0933)  GBS: Positive/-- (06/23 0948)  2 hr Glucola: passed Genetic screening:  nl Anatomy 04-19-1972: nl  Prenatal Transfer Tool  Maternal Diabetes: No Genetic Screening: Normal Maternal Ultrasounds/Referrals: Normal Fetal Ultrasounds or other Referrals:  Has seen MFM for high risk pregnancy Maternal Substance Abuse:  hx of substance abuse (cocaine) Significant Maternal Medications:  Labetolol 750 BID, Procardia 60  Significant Maternal Lab Results: Group B Strep positive  Results for orders placed or performed during the hospital encounter of 12/10/20 (from the past 24 hour(s))  Type and screen   Collection  Time: 12/10/20  2:40 PM  Result Value Ref Range   ABO/RH(D) PENDING    Antibody Screen PENDING    Sample Expiration      12/13/2020,2359 Performed at [facility], [address], [place], [place]     Patient Active Problem List   Diagnosis Date Noted   Chronic migraine w/o aura w/o status migrainosus, not intractable 10/06/2020   History of substance abuse (HCC) 05/23/2020   Supervision of high risk pregnancy, antepartum 05/19/2020   Chronic hypertension affecting pregnancy 05/17/2020  Depression affecting pregnancy 08/12/2013    Assessment/Plan:  MIHIRA TOZZI is a 35 y.o. D3U2025 at [redacted]w[redacted]d here for IOL- cHTN (Labetolol 750 BID, Procardia 60).  #IOL: Presenting for cHTN. Start cytotec and have placed a FB.  #Pain: prn #FWB: Cat 1 #ID: GBS positive, PCN #MOF: breast/bottle #MOC: tubal ligation #Circ:  no #Chronic HTN: Stable on Labetolol, procardia. F/u w/ labs and monitor VS.  Alfredo Martinez, MD, PGY1 Center for [facility], [facility] 12/10/2020, 3:11 PM

## 2020-12-11 ENCOUNTER — Encounter (HOSPITAL_COMMUNITY): Payer: Self-pay | Admitting: Obstetrics and Gynecology

## 2020-12-11 ENCOUNTER — Inpatient Hospital Stay (HOSPITAL_COMMUNITY): Payer: Medicaid Other | Admitting: Anesthesiology

## 2020-12-11 ENCOUNTER — Encounter (HOSPITAL_COMMUNITY)
Admission: AD | Disposition: A | Discharge status: Home / Self Care | Payer: Self-pay | Source: Home / Self Care | Attending: Obstetrics and Gynecology

## 2020-12-11 DIAGNOSIS — K219 Gastro-esophageal reflux disease without esophagitis: Secondary | ICD-10-CM | POA: Diagnosis not present

## 2020-12-11 DIAGNOSIS — Z9079 Acquired absence of other genital organ(s): Secondary | ICD-10-CM

## 2020-12-11 DIAGNOSIS — O99824 Streptococcus B carrier state complicating childbirth: Secondary | ICD-10-CM | POA: Diagnosis not present

## 2020-12-11 DIAGNOSIS — O1002 Pre-existing essential hypertension complicating childbirth: Secondary | ICD-10-CM | POA: Diagnosis not present

## 2020-12-11 DIAGNOSIS — Z641 Problems related to multiparity: Secondary | ICD-10-CM | POA: Diagnosis not present

## 2020-12-11 DIAGNOSIS — Z3A37 37 weeks gestation of pregnancy: Secondary | ICD-10-CM | POA: Diagnosis not present

## 2020-12-11 DIAGNOSIS — O99820 Streptococcus B carrier state complicating pregnancy: Secondary | ICD-10-CM | POA: Diagnosis not present

## 2020-12-11 DIAGNOSIS — Z87891 Personal history of nicotine dependence: Secondary | ICD-10-CM | POA: Diagnosis not present

## 2020-12-11 DIAGNOSIS — O119 Pre-existing hypertension with pre-eclampsia, unspecified trimester: Secondary | ICD-10-CM | POA: Diagnosis not present

## 2020-12-11 DIAGNOSIS — F418 Other specified anxiety disorders: Secondary | ICD-10-CM | POA: Diagnosis not present

## 2020-12-11 DIAGNOSIS — Z302 Encounter for sterilization: Secondary | ICD-10-CM | POA: Diagnosis not present

## 2020-12-11 HISTORY — PX: TUBAL LIGATION: SHX77

## 2020-12-11 LAB — RPR: RPR Ser Ql: NONREACTIVE

## 2020-12-11 SURGERY — LIGATION, FALLOPIAN TUBE, POSTPARTUM
Anesthesia: General

## 2020-12-11 MED ORDER — DEXMEDETOMIDINE HCL 200 MCG/2ML IV SOLN
INTRAVENOUS | Status: DC | PRN
  Administered 2020-12-11: 8 ug via INTRAVENOUS

## 2020-12-11 MED ORDER — LACTATED RINGERS IV SOLN
INTRAVENOUS | Status: DC
  Administered 2020-12-11: 10 mL via INTRAVENOUS

## 2020-12-11 MED ORDER — BUPIVACAINE HCL (PF) 0.5 % IJ SOLN
INTRAMUSCULAR | Status: AC
  Filled 2020-12-11: qty 30

## 2020-12-11 MED ORDER — FENTANYL CITRATE (PF) 100 MCG/2ML IJ SOLN
INTRAMUSCULAR | Status: AC
  Filled 2020-12-11: qty 2

## 2020-12-11 MED ORDER — HYDRALAZINE HCL 20 MG/ML IJ SOLN: 10.0000 mg | INTRAMUSCULAR | Status: DC | PRN

## 2020-12-11 MED ORDER — ONDANSETRON HCL 4 MG/2ML IJ SOLN
INTRAMUSCULAR | Status: DC | PRN
  Administered 2020-12-11: 4 mg via INTRAVENOUS

## 2020-12-11 MED ORDER — OXYCODONE HCL 5 MG PO TABS
5.0000 mg | ORAL_TABLET | ORAL | Status: DC | PRN
  Administered 2020-12-11: 5 mg via ORAL
  Filled 2020-12-11: qty 1

## 2020-12-11 MED ORDER — MIDAZOLAM HCL 2 MG/2ML IJ SOLN
INTRAMUSCULAR | Status: AC
  Filled 2020-12-11: qty 2

## 2020-12-11 MED ORDER — LABETALOL HCL 5 MG/ML IV SOLN
40.0000 mg | INTRAVENOUS | Status: DC | PRN
Start: 2020-12-11 — End: 2020-12-12

## 2020-12-11 MED ORDER — TRANEXAMIC ACID-NACL 1000-0.7 MG/100ML-% IV SOLN
INTRAVENOUS | Status: AC
  Administered 2020-12-11: 1000 mg
  Filled 2020-12-11: qty 100

## 2020-12-11 MED ORDER — HYDROMORPHONE HCL 1 MG/ML IJ SOLN
INTRAMUSCULAR | Status: AC
  Filled 2020-12-11: qty 0.5

## 2020-12-11 MED ORDER — HYDROMORPHONE HCL 1 MG/ML IJ SOLN
0.2500 mg | INTRAMUSCULAR | Status: DC | PRN
  Administered 2020-12-11: 0.5 mg via INTRAVENOUS

## 2020-12-11 MED ORDER — NIFEDIPINE ER OSMOTIC RELEASE 30 MG PO TB24
60.0000 mg | ORAL_TABLET | Freq: Two times a day (BID) | ORAL | Status: DC
  Administered 2020-12-11 (×2): 60 mg via ORAL
  Filled 2020-12-11 (×2): qty 2

## 2020-12-11 MED ORDER — KETOROLAC TROMETHAMINE 30 MG/ML IJ SOLN
30.0000 mg | Freq: Once | INTRAMUSCULAR | Status: AC | PRN
  Administered 2020-12-11: 30 mg via INTRAVENOUS

## 2020-12-11 MED ORDER — WITCH HAZEL-GLYCERIN EX PADS: 1.0000 "application " | MEDICATED_PAD | CUTANEOUS | Status: DC | PRN

## 2020-12-11 MED ORDER — SUCCINYLCHOLINE CHLORIDE 200 MG/10ML IV SOSY
PREFILLED_SYRINGE | INTRAVENOUS | Status: AC
  Filled 2020-12-11: qty 10

## 2020-12-11 MED ORDER — KETOROLAC TROMETHAMINE 30 MG/ML IJ SOLN
INTRAMUSCULAR | Status: AC
  Filled 2020-12-11: qty 1

## 2020-12-11 MED ORDER — FAMOTIDINE 20 MG PO TABS
40.0000 mg | ORAL_TABLET | Freq: Once | ORAL | Status: AC
  Administered 2020-12-11: 40 mg via ORAL
  Filled 2020-12-11: qty 2

## 2020-12-11 MED ORDER — SIMETHICONE 80 MG PO CHEW: 80.0000 mg | CHEWABLE_TABLET | ORAL | Status: DC | PRN

## 2020-12-11 MED ORDER — MIDAZOLAM HCL 2 MG/2ML IJ SOLN
INTRAMUSCULAR | Status: DC | PRN
  Administered 2020-12-11: 2 mg via INTRAVENOUS

## 2020-12-11 MED ORDER — OXYCODONE HCL 5 MG PO TABS: 5.0000 mg | ORAL_TABLET | Freq: Once | ORAL | Status: DC | PRN

## 2020-12-11 MED ORDER — OXYCODONE HCL 5 MG/5ML PO SOLN
5.0000 mg | Freq: Once | ORAL | Status: DC | PRN
Start: 2020-12-11 — End: 2020-12-11

## 2020-12-11 MED ORDER — MEPERIDINE HCL 25 MG/ML IJ SOLN: 6.2500 mg | INTRAMUSCULAR | Status: DC | PRN

## 2020-12-11 MED ORDER — MEASLES, MUMPS & RUBELLA VAC IJ SOLR: 0.5000 mL | Freq: Once | INTRAMUSCULAR | Status: DC

## 2020-12-11 MED ORDER — DEXAMETHASONE SODIUM PHOSPHATE 10 MG/ML IJ SOLN
INTRAMUSCULAR | Status: DC | PRN
  Administered 2020-12-11: 10 mg via INTRAVENOUS

## 2020-12-11 MED ORDER — DOCUSATE SODIUM 100 MG PO CAPS
100.0000 mg | ORAL_CAPSULE | Freq: Two times a day (BID) | ORAL | Status: DC
  Administered 2020-12-11 – 2020-12-12 (×2): 100 mg via ORAL
  Filled 2020-12-11 (×2): qty 1

## 2020-12-11 MED ORDER — BUPIVACAINE HCL (PF) 0.5 % IJ SOLN
INTRAMUSCULAR | Status: DC | PRN
  Administered 2020-12-11: 20 mL

## 2020-12-11 MED ORDER — METOCLOPRAMIDE HCL 10 MG PO TABS
10.0000 mg | ORAL_TABLET | Freq: Once | ORAL | Status: AC
  Administered 2020-12-11: 10 mg via ORAL
  Filled 2020-12-11: qty 1

## 2020-12-11 MED ORDER — LIDOCAINE 2% (20 MG/ML) 5 ML SYRINGE
INTRAMUSCULAR | Status: AC
  Filled 2020-12-11: qty 5

## 2020-12-11 MED ORDER — BENZOCAINE-MENTHOL 20-0.5 % EX AERO: 1.0000 "application " | INHALATION_SPRAY | CUTANEOUS | Status: DC | PRN

## 2020-12-11 MED ORDER — PRENATAL MULTIVITAMIN CH
1.0000 | ORAL_TABLET | Freq: Every day | ORAL | Status: DC
  Administered 2020-12-12: 1 via ORAL
  Filled 2020-12-11: qty 1

## 2020-12-11 MED ORDER — SUCCINYLCHOLINE CHLORIDE 20 MG/ML IJ SOLN
INTRAMUSCULAR | Status: DC | PRN
  Administered 2020-12-11: 120 mg via INTRAVENOUS

## 2020-12-11 MED ORDER — DROPERIDOL 2.5 MG/ML IJ SOLN: 0.6250 mg | Freq: Once | INTRAMUSCULAR | Status: DC | PRN

## 2020-12-11 MED ORDER — ONDANSETRON HCL 4 MG PO TABS: 4.0000 mg | ORAL_TABLET | ORAL | Status: DC | PRN

## 2020-12-11 MED ORDER — ONDANSETRON HCL 4 MG/2ML IJ SOLN
INTRAMUSCULAR | Status: AC
  Filled 2020-12-11: qty 2

## 2020-12-11 MED ORDER — PROMETHAZINE HCL 25 MG/ML IJ SOLN: 6.2500 mg | INTRAMUSCULAR | Status: DC | PRN

## 2020-12-11 MED ORDER — DEXAMETHASONE SODIUM PHOSPHATE 10 MG/ML IJ SOLN
INTRAMUSCULAR | Status: AC
  Filled 2020-12-11: qty 1

## 2020-12-11 MED ORDER — LIDOCAINE HCL (CARDIAC) PF 100 MG/5ML IV SOSY
PREFILLED_SYRINGE | INTRAVENOUS | Status: DC | PRN
  Administered 2020-12-11: 100 mg via INTRATRACHEAL

## 2020-12-11 MED ORDER — LABETALOL HCL 5 MG/ML IV SOLN
20.0000 mg | INTRAVENOUS | Status: DC | PRN
Start: 2020-12-11 — End: 2020-12-12
  Administered 2020-12-11: 20 mg via INTRAVENOUS

## 2020-12-11 MED ORDER — DIBUCAINE (PERIANAL) 1 % EX OINT: 1.0000 "application " | TOPICAL_OINTMENT | CUTANEOUS | Status: DC | PRN

## 2020-12-11 MED ORDER — PROPOFOL 10 MG/ML IV BOLUS
INTRAVENOUS | Status: DC | PRN
  Administered 2020-12-11: 200 mg via INTRAVENOUS
  Administered 2020-12-11: 50 mg via INTRAVENOUS

## 2020-12-11 MED ORDER — DEXMEDETOMIDINE (PRECEDEX) IN NS 20 MCG/5ML (4 MCG/ML) IV SYRINGE
PREFILLED_SYRINGE | INTRAVENOUS | Status: AC
  Filled 2020-12-11: qty 5

## 2020-12-11 MED ORDER — SENNOSIDES-DOCUSATE SODIUM 8.6-50 MG PO TABS
2.0000 | ORAL_TABLET | ORAL | Status: DC
  Administered 2020-12-12: 2 via ORAL
  Filled 2020-12-11: qty 2

## 2020-12-11 MED ORDER — PROPOFOL 10 MG/ML IV BOLUS
INTRAVENOUS | Status: AC
  Filled 2020-12-11: qty 40

## 2020-12-11 MED ORDER — SODIUM CHLORIDE 0.9 % IR SOLN
Status: DC | PRN
  Administered 2020-12-11: 1

## 2020-12-11 MED ORDER — LABETALOL HCL 5 MG/ML IV SOLN: 80.0000 mg | INTRAVENOUS | Status: DC | PRN

## 2020-12-11 MED ORDER — LABETALOL HCL 5 MG/ML IV SOLN
INTRAVENOUS | Status: AC
  Filled 2020-12-11: qty 4

## 2020-12-11 MED ORDER — COCONUT OIL OIL: 1.0000 "application " | TOPICAL_OIL | Status: DC | PRN

## 2020-12-11 MED ORDER — ONDANSETRON HCL 4 MG/2ML IJ SOLN: 4.0000 mg | INTRAMUSCULAR | Status: DC | PRN

## 2020-12-11 MED ORDER — IBUPROFEN 600 MG PO TABS
600.0000 mg | ORAL_TABLET | Freq: Four times a day (QID) | ORAL | Status: DC
  Administered 2020-12-11 – 2020-12-12 (×5): 600 mg via ORAL
  Filled 2020-12-11 (×5): qty 1

## 2020-12-11 MED ORDER — FENTANYL CITRATE (PF) 100 MCG/2ML IJ SOLN
INTRAMUSCULAR | Status: DC | PRN
  Administered 2020-12-11: 100 ug via INTRAVENOUS

## 2020-12-11 MED ORDER — TETANUS-DIPHTH-ACELL PERTUSSIS 5-2.5-18.5 LF-MCG/0.5 IM SUSY: 0.5000 mL | PREFILLED_SYRINGE | Freq: Once | INTRAMUSCULAR | Status: DC

## 2020-12-11 MED ORDER — DIPHENHYDRAMINE HCL 25 MG PO CAPS
25.0000 mg | ORAL_CAPSULE | Freq: Four times a day (QID) | ORAL | Status: DC | PRN
Start: 2020-12-11 — End: 2020-12-12

## 2020-12-11 MED ORDER — ACETAMINOPHEN 325 MG PO TABS: 650.0000 mg | ORAL_TABLET | ORAL | Status: DC | PRN

## 2020-12-11 SURGICAL SUPPLY — 27 items
APL SKNCLS STERI-STRIP NONHPOA (GAUZE/BANDAGES/DRESSINGS)
BENZOIN TINCTURE PRP APPL 2/3 (GAUZE/BANDAGES/DRESSINGS) IMPLANT
BLADE SURG 11 STRL SS (BLADE) ×2 IMPLANT
CLOTH BEACON ORANGE TIMEOUT ST (SAFETY) ×2 IMPLANT
DRSG OPSITE POSTOP 3X4 (GAUZE/BANDAGES/DRESSINGS) ×2 IMPLANT
DURAPREP 26ML APPLICATOR (WOUND CARE) ×2 IMPLANT
ELECT REM PT RETURN 9FT ADLT (ELECTROSURGICAL) ×2
ELECTRODE REM PT RTRN 9FT ADLT (ELECTROSURGICAL) ×1 IMPLANT
GLOVE BIOGEL PI IND STRL 7.0 (GLOVE) ×3 IMPLANT
GLOVE BIOGEL PI INDICATOR 7.0 (GLOVE) ×3
GLOVE ECLIPSE 7.0 STRL STRAW (GLOVE) ×2 IMPLANT
GOWN STRL REUS W/TWL LRG LVL3 (GOWN DISPOSABLE) ×4 IMPLANT
NEEDLE HYPO 22GX1.5 SAFETY (NEEDLE) ×2 IMPLANT
NS IRRIG 1000ML POUR BTL (IV SOLUTION) ×2 IMPLANT
PACK ABDOMINAL MINOR (CUSTOM PROCEDURE TRAY) ×2 IMPLANT
PENCIL BUTTON HOLSTER BLD 10FT (ELECTRODE) ×2 IMPLANT
PROTECTOR NERVE ULNAR (MISCELLANEOUS) ×2 IMPLANT
SPONGE LAP 4X18 RFD (DISPOSABLE) IMPLANT
SUT VIC AB 0 CT1 27 (SUTURE) ×2
SUT VIC AB 0 CT1 27XBRD ANBCTR (SUTURE) ×1 IMPLANT
SUT VIC AB 2-0 CT1 27 (SUTURE) ×4
SUT VIC AB 2-0 CT1 TAPERPNT 27 (SUTURE) IMPLANT
SUT VICRYL 4-0 PS2 18IN ABS (SUTURE) ×2 IMPLANT
SYR CONTROL 10ML LL (SYRINGE) ×2 IMPLANT
TOWEL OR 17X24 6PK STRL BLUE (TOWEL DISPOSABLE) ×4 IMPLANT
TRAY FOLEY CATH SILVER 14FR (SET/KITS/TRAYS/PACK) ×2 IMPLANT
WATER STERILE IRR 1000ML POUR (IV SOLUTION) ×2 IMPLANT

## 2020-12-11 NOTE — Lactation Note (Signed)
This note was copied from a baby's chart. Lactation Consultation Note  Patient Name: Andrea Burns [id number] Date: 12/11/2020 Reason for consult: Initial assessment;Early term 37-38.6wks;1st time breastfeeding Age:34 hours   P5 mother whose infant is now 30 hours old.  This is an ETI at 37+5 weeks.  Mother did not breast feed her other children.  Her feeding preference is breast/formula.  Baby was swaddled and asleep next to mother when I arrived.  Mother had no questions/concerns related to breast feeding.  Two LATCH scores of 10 each recorded.  Reviewed breast feeding basics with mother.  She is able to hand express colostrum.  Encouraged mother to continue practicing hand expression and to feed back any EBM she obtains to baby.  She is supplementing with Enfamil 20 calorie formula with adequate volumes.  Mother is interested in having the baby bathed at this time: RN informed.  No support person present at this time.  Mother will call for assistance as needed.    Maternal Data Has patient been taught Hand Expression?: Yes  Feeding Mother's Current Feeding Choice: Breast Milk and Formula  LATCH Score                    Lactation Tools Discussed/Used    Interventions    Discharge    Consult Status Consult Status: Follow-up Date: 12/12/20 Follow-up type: In-patient    Guerino Caporale R Falecia Vannatter 12/11/2020, 4:28 PM

## 2020-12-11 NOTE — Progress Notes (Signed)
Dr. Myriam Jacobson states that patient can have sip of water to take am medication

## 2020-12-11 NOTE — Anesthesia Procedure Notes (Signed)
Procedure Name: Intubation Date/Time: 12/11/2020 9:33 AM Performed by: Elgie Congo, CRNA Pre-anesthesia Checklist: Patient identified, Emergency Drugs available, Suction available and Patient being monitored Patient Re-evaluated:Patient Re-evaluated prior to induction Oxygen Delivery Method: Circle system utilized Preoxygenation: Pre-oxygenation with 100% oxygen Induction Type: IV induction, Rapid sequence and Cricoid Pressure applied Laryngoscope Size: Glidescope Grade View: Grade I Tube type: Oral Tube size: 7.0 mm Number of attempts: 1 Airway Equipment and Method: Rigid stylet Placement Confirmation: ETT inserted through vocal cords under direct vision, positive ETCO2 and breath sounds checked- equal and bilateral Secured at: 21 cm Tube secured with: Tape Dental Injury: Teeth and Oropharynx as per pre-operative assessment

## 2020-12-11 NOTE — Lactation Note (Signed)
This note was copied from a baby's chart. Lactation Consultation Note  Patient Name: Andrea Burns LDJTT'S Date: 12/11/2020   Age:34 hours   LC Note:  Attempted to visit with mother, however, she was in the OR receiving a tubal ligation.  Will plan a follow up visit for later today.   Maternal Data    Feeding Nipple Type: Extra Slow Flow  LATCH Score                    Lactation Tools Discussed/Used    Interventions    Discharge    Consult Status      Andrea Burns R Andrea Burns 12/11/2020, 11:57 AM

## 2020-12-11 NOTE — Anesthesia Postprocedure Evaluation (Signed)
Anesthesia Post Note  Patient: Andrea Burns  Procedure(s) Performed: POST PARTUM TUBAL LIGATION     Patient location during evaluation: PACU Anesthesia Type: General Level of consciousness: sedated and patient cooperative Pain management: pain level controlled Vital Signs Assessment: post-procedure vital signs reviewed and stable Respiratory status: spontaneous breathing Cardiovascular status: stable Anesthetic complications: no   No notable events documented.  Last Vitals:  Vitals:   12/11/20 1156 12/11/20 1200  BP:  (!) 153/88  Pulse: 64 77  Resp: 16 17  Temp:  (!) 36.1 C  SpO2: 99% 97%    Last Pain:  Vitals:   12/11/20 1400  TempSrc:   PainSc: 8                  Aissa Lisowski [employer]

## 2020-12-11 NOTE — Discharge Summary (Signed)
Postpartum Discharge Summary      Patient Name: Andrea Burns DOB: 12-27-1985 MRN: [id number]  Date of admission: 12/10/2020 Delivery date:12/11/2020  Delivering provider: Janet Berlin  Date of discharge: 12/12/2020  Admitting diagnosis: Chronic hypertension affecting pregnancy [O10.919] Intrauterine pregnancy: [redacted]w[redacted]d    Secondary diagnosis:  Active Problems:   Chronic hypertension affecting pregnancy   Vaginal delivery   Status post bilateral salpingectomy  Additional problems: none    Discharge diagnosis: Term Pregnancy Delivered and CHTN                                              Post partum procedures: BTL  Augmentation: Pitocin, Cytotec, and IP Foley Complications: None  Hospital course: Onset of Labor With Vaginal Delivery      35y.o. yo [id number] [id number] admitted in Latent Labor on 12/10/2020. Patient had an uncomplicated labor course as follows:  Membrane Rupture Time/Date: 12:08 AM ,12/11/2020   Delivery Method:Vaginal, Spontaneous  Episiotomy: None  Lacerations:  None  Patient had an uncomplicated postpartum course. She was transitioned to procardia 60 mg bid and her blood pressures were normotensive. She had a tubal ligation. She is ambulating, tolerating a regular diet, passing flatus, and urinating well. Patient is discharged home in stable condition on 12/12/20.  Newborn Data: Birth date:12/11/2020  Birth time:12:17 AM  Gender:Female  Living status:Living  Apgars:9 ,9  Weight:3073 g   Magnesium Sulfate received: No BMZ received: No Rhophylac:N/A MMR:N/A T-DaP:Given prenatally Flu: No Transfusion:No  Physical exam  Vitals:   12/11/20 2152 12/12/20 0017 12/12/20 0422 12/12/20 0720  BP: (!) 142/87 116/62 119/63 113/65  Pulse: 90 85 83 79  Resp:  18 18 16   Temp:  98.4 F (36.9 C) 98.3 F (36.8 C) 98.1 F (36.7 C)  TempSrc:  Oral Oral Oral  SpO2:  100% 100% 98%  Weight:      Height:       General: alert, cooperative, and no  distress Lochia: appropriate Uterine Fundus: firm Incision: N/A DVT Evaluation: No evidence of DVT seen on physical exam. Labs: Lab Results  Component Value Date   WBC 9.9 12/10/2020   HGB 11.5 (L) 12/10/2020   HCT 34.2 (L) 12/10/2020   MCV 87.5 12/10/2020   PLT 233 12/10/2020   CMP Latest Ref Rng & Units 12/10/2020  Glucose 70 - 99 mg/dL 90  BUN 6 - 20 mg/dL 7  Creatinine 0.44 - 1.00 mg/dL 0.73  Sodium 135 - 145 mmol/L 135  Potassium 3.5 - 5.1 mmol/L 3.8  Chloride 98 - 111 mmol/L 109  CO2 22 - 32 mmol/L 20(L)  Calcium 8.9 - 10.3 mg/dL 9.2  Total Protein 6.5 - 8.1 g/dL 6.2(L)  Total Bilirubin 0.3 - 1.2 mg/dL 0.4  Alkaline Phos 38 - 126 U/L 99  AST 15 - 41 U/L 19  ALT 0 - 44 U/L 15   Edinburgh Score: Edinburgh Postnatal Depression Scale Screening Tool 12/11/2020  I have been able to laugh and see the funny side of things. 1  I have looked forward with enjoyment to things. 2  I have blamed myself unnecessarily when things went wrong. 3  I have been anxious or worried for no good reason. 3  I have felt scared or panicky for no good reason. 3  Things have been getting on top of me. 1  I have been so unhappy that I have had difficulty sleeping. 2  I have felt sad or miserable. 2  I have been so unhappy that I have been crying. 1  The thought of harming myself has occurred to me. 0  Edinburgh Postnatal Depression Scale Total 18     After visit meds:  Allergies as of 12/12/2020   No Known Allergies      Medication List     STOP taking these medications    aspirin EC 81 MG tablet   labetalol 300 MG tablet Commonly known as: NORMODYNE       TAKE these medications    acetaminophen 325 MG tablet Commonly known as: Tylenol Take 2 tablets (650 mg total) by mouth every 4 (four) hours as needed (for pain scale < 4).   albuterol 108 (90 Base) MCG/ACT inhaler Commonly known as: VENTOLIN HFA Inhale 2 puffs into the lungs every 4 (four) hours as needed for wheezing or  shortness of breath.   docusate sodium 100 MG capsule Commonly known as: COLACE Take 1 capsule (100 mg total) by mouth 2 (two) times daily as needed. What changed: Another medication with the same name was added. Make sure you understand how and when to take each.   docusate sodium 100 MG capsule Commonly known as: COLACE Take 1 capsule (100 mg total) by mouth 2 (two) times daily. What changed: You were already taking a medication with the same name, and this prescription was added. Make sure you understand how and when to take each.   ferrous sulfate 325 (65 FE) MG tablet Commonly known as: FerrouSul Take 1 tablet (325 mg total) by mouth 2 (two) times daily.   ibuprofen 600 MG tablet Commonly known as: ADVIL Take 1 tablet (600 mg total) by mouth every 6 (six) hours.   NIFEdipine 60 MG 24 hr tablet Commonly known as: ADALAT CC Take 1 tablet (60 mg total) by mouth 2 (two) times daily. What changed: when to take this   oxyCODONE 5 MG immediate release tablet Commonly known as: Oxy IR/ROXICODONE Take 1 tablet (5 mg total) by mouth every 4 (four) hours as needed for severe pain or breakthrough pain.   prenatal multivitamin Tabs tablet Take 1 tablet by mouth daily at 12 noon.         Discharge home in stable condition Infant Feeding: Bottle and Breast Infant Disposition:home with mother Discharge instruction: per After Visit Summary and Postpartum booklet. Activity: Advance as tolerated. Pelvic rest for 6 weeks.  Diet: routine diet Future Appointments: Future Appointments  Date Time Provider [place]  12/21/2020 12:45 PM Suzzanne Cloud, NP [facility] None  03/08/2021  9:00 AM Jerline Pain, MD [facility]   Follow up Visit:   Please schedule this patient for a In person postpartum visit in 6 weeks with the following provider: Any provider. Additional Postpartum F/U:BP check 1 week  High risk pregnancy complicated by: HTN Delivery mode:  Vaginal,  Spontaneous  Anticipated Birth Control:  BTL (salpingectomy), done on 7/4    12/12/2020 Janet Berlin, MD

## 2020-12-11 NOTE — Lactation Note (Signed)
This note was copied from a baby's chart. Lactation Consultation Note  Patient Name: Girl Emerald Shor DTOIZ'T Date: 12/11/2020   Age:34 hours   LC Note:  Second attempt to visit with mother, however, she is asleep from her anesthesia.  Evening LC will follow up.   Maternal Data    Feeding    LATCH Score                    Lactation Tools Discussed/Used    Interventions    Discharge    Consult Status      [employer] R Britani Beattie 12/11/2020, 3:00 PM

## 2020-12-11 NOTE — Transfer of Care (Signed)
Immediate Anesthesia Transfer of Care Note  Patient: Andrea Burns  Procedure(s) Performed: POST PARTUM TUBAL LIGATION  Patient Location: PACU  Anesthesia Type:General  Level of Consciousness: awake  Airway & Oxygen Therapy: Patient Spontanous Breathing and Patient connected to face mask oxygen  Post-op Assessment: Report given to RN and Post -op Vital signs reviewed and stable  Post vital signs: Reviewed and stable  Last Vitals:  Vitals Value Taken Time  BP 131/78 12/11/20 1100  Temp 37 C 12/11/20 1049  Pulse 79 12/11/20 1102  Resp 24 12/11/20 1102  SpO2 94 % 12/11/20 1102  Vitals shown include unvalidated device data.  Last Pain:  Vitals:   12/11/20 1100  TempSrc:   PainSc: Asleep      Patients Stated Pain Goal: 3 (12/10/20 2334)  Complications: No notable events documented.

## 2020-12-11 NOTE — Progress Notes (Signed)
    Faculty Practice OB/GYN Attending Postpartum Sterilization Counseling Note  35 y.o. [id number] s/p recent vaginal delivery at [redacted]w[redacted]d who desires permanent sterilization. Medicaid papers had been signed on 11/02/2020.  Other reversible forms of contraception including the most effective LARCs such as IUD or Nexplanon were discussed with patient; she declines all other modalities. Her FOB also declined vasectomy, after being counseled about this procedure being less invasive and more effective.  Patient was also given the option of an interval laparoscopic tubal ligation or bilateral salpingectomy which slightly increases the efficacy and is less invasive but she declined.  Details of postpartum tubal sterilization discussed in detail.   She was told that this will be performed as either salpingectomy or occlusion with Filshie clips, depending on difficulty of procedure, exposure and other factors.  Risks of procedure discussed with patient including but not limited to: risk of regret, permanence of method, bleeding, infection, injury to surrounding organs and need for additional procedures.  Failure risk of about 1-2% with increased risk of ectopic gestation if pregnancy occurs was also discussed with patient.   Also discussed possibility of post-tubal syndrome with increased pelvic pain or menstrual irregularities. Patient verbalized understanding of these risks and wants to proceed with sterilization.  Written informed consent obtained.  Procedure has been scheduled for around 0900 today.  Patient understands that her procedure may be delayed by any cases coming from L&D or MAU, or any other urgent events in [facility] as this is an elective procedure.    NPO and other preoperative orders already in place.  Will continue close observation and postpartum care as ordered. To OR when ready.    Jaynie Collins, MD, FACOG Obstetrician & Gynecologist, [facility] for [employer], [facility]

## 2020-12-11 NOTE — Op Note (Signed)
Reshonda A Arvanitis 12/10/2020 - 12/11/2020  PREOPERATIVE DIAGNOSES: Multiparity, undesired fertility  POSTOPERATIVE DIAGNOSES: Multiparity, undesired fertility  PROCEDURE:  Postpartum Bilateral Salpingectomy  SURGEON: Dr.  Jaynie Collins  ANESTHESIA:  General and local analgesia using 20 ml of 0.5% Marcaine  COMPLICATIONS:  None immediate.  ESTIMATED BLOOD LOSS: 20 ml.  INDICATIONS:  35 y.o. [id number] with undesired fertility, status post vaginal delivery, desires permanent sterilization.  Other reversible forms of contraception were discussed with patient; she declines all other modalities. Risks of procedure discussed with patient including but not limited to: risk of regret, permanence of method, bleeding, infection, injury to surrounding organs and need for additional procedures.  Discussed failure risk of 1% with increased risk of ectopic gestation if pregnancy occurs.  Also discussed possibility of post-tubal syndrome with increased pelvic pain or menstrual irregularities.  Patient verbalized understanding of these risks and wants to proceed with sterilization.  Written informed consent obtained.     FINDINGS:  Normal uterus, tubes, and ovaries. Excised fallopian tubes were sent to pathology.  PROCEDURE DETAILS: The patient was taken to the operating room where her epidural anesthesia was dosed up to surgical level and found to be adequate.  She was then placed in the dorsal supine position and prepped and draped in sterile fashion.   After an adequate timeout was performed, attention was turned to the patient's abdomen local analgesia was administered.  A small transverse skin incision was made under the umbilical fold. The incision was taken down to the layer of fascia using the scalpel, and fascia was incised, and extended bilaterally using Mayo scissors. The peritoneum was entered in a sharp fashion.  Attention was then turned to the patient's uterus, and left fallopian tube was then  identified, and the Babcock clamp was then used to grasp the tube. Kelly forceps were placed on the distal portion of the mesosalpinx underneath about 80-90% of the tube.  This pedicle was double suture ligated with 2-0 Vicryl, and this large portion of the tube including the fimbriated end was excised.  The right fallopian tube was then identified, doubly ligated, excised in a similar fashion allowing for bilateral salpingectomy.   Good hemostasis was noted overall.  The instruments were then removed from the patient's abdomen and the fascial incision was repaired with 0 Vicryl, and the skin was closed with a 4-0 Vicryl subcuticular stitch. The patient tolerated the procedure well.  Instrument, sponge, and needle counts were correct times three.  The patient was then taken to the recovery room awake and in stable condition.    Jaynie Collins, MD, FACOG Obstetrician & Gynecologist, [facility] for [employer], [facility]

## 2020-12-11 NOTE — Anesthesia Preprocedure Evaluation (Addendum)
Anesthesia Evaluation  Patient identified by MRN, date of birth, ID band Patient awake    Reviewed: Allergy & Precautions, NPO status , Patient's Chart, lab work & pertinent test results  History of Anesthesia Complications Negative for: history of anesthetic complications  Airway Mallampati: II  TM Distance: >3 FB Neck ROM: Full    Dental no notable dental hx. (+) Dental Advisory Given Fillings :   Pulmonary asthma , pneumonia, former smoker,    Pulmonary exam normal breath sounds clear to auscultation       Cardiovascular hypertension, Pt. on home beta blockers and Pt. on medications Normal cardiovascular exam Rhythm:Regular Rate:Normal - Carotid Bruit    Neuro/Psych  Headaches, PSYCHIATRIC DISORDERS (not on meds) Anxiety Depression    GI/Hepatic GERD  Controlled,(+)     substance abuse (used 1 week ago, urine drug screen - negative )  cocaine use,   Endo/Other  negative endocrine ROS  Renal/GU negative Renal ROS     Musculoskeletal   Abdominal (+) + obese,   Peds  Hematology negative hematology ROS (+) anemia ,   Anesthesia Other Findings Gets chest pain when patient is anxious.  Reproductive/Obstetrics                            Anesthesia Physical  Anesthesia Plan  ASA: 2  Anesthesia Plan: General   Post-op Pain Management:    Induction: Intravenous  PONV Risk Score and Plan: 4 or greater and Ondansetron, Dexamethasone, Midazolam, Scopolamine patch - Pre-op and Treatment may vary due to age or medical condition  Airway Management Planned: Oral ETT  Additional Equipment: None  Intra-op Plan:   Post-operative Plan: Extubation in OR  Informed Consent: I have reviewed the patients History and Physical, chart, labs and discussed the procedure including the risks, benefits and alternatives for the proposed anesthesia with the patient or authorized representative who has  indicated his/her understanding and acceptance.     Dental advisory given  Plan Discussed with: CRNA  Anesthesia Plan Comments:        Anesthesia Quick Evaluation

## 2020-12-12 ENCOUNTER — Other Ambulatory Visit: Payer: Self-pay

## 2020-12-12 ENCOUNTER — Other Ambulatory Visit (HOSPITAL_COMMUNITY): Payer: Self-pay

## 2020-12-12 MED ORDER — IBUPROFEN 600 MG PO TABS: 600.0000 mg | ORAL_TABLET | Freq: Four times a day (QID) | ORAL | 0 refills | Status: DC

## 2020-12-12 MED ORDER — ACETAMINOPHEN 325 MG PO TABS: 650.0000 mg | ORAL_TABLET | ORAL | Status: DC | PRN

## 2020-12-12 MED ORDER — NIFEDIPINE ER 60 MG PO TB24
60.0000 mg | ORAL_TABLET | Freq: Every day | ORAL | 0 refills | Status: DC
  Filled 2020-12-12: qty 60, 60d supply, fill #0

## 2020-12-12 MED ORDER — NIFEDIPINE ER OSMOTIC RELEASE 30 MG PO TB24
60.0000 mg | ORAL_TABLET | Freq: Every day | ORAL | Status: DC
  Administered 2020-12-12: 60 mg via ORAL
  Filled 2020-12-12: qty 2

## 2020-12-12 MED ORDER — NIFEDIPINE ER 60 MG PO TB24
60.0000 mg | ORAL_TABLET | Freq: Two times a day (BID) | ORAL | 0 refills | Status: DC
  Filled 2020-12-12: qty 60, 30d supply, fill #0

## 2020-12-12 MED ORDER — OXYCODONE HCL 5 MG PO TABS
5.0000 mg | ORAL_TABLET | ORAL | 0 refills | Status: DC | PRN
  Filled 2020-12-12: qty 10, 2d supply, fill #0

## 2020-12-12 MED ORDER — DOCUSATE SODIUM 100 MG PO CAPS
100.0000 mg | ORAL_CAPSULE | Freq: Two times a day (BID) | ORAL | 0 refills | Status: DC
  Filled 2020-12-12: qty 10, 5d supply, fill #0

## 2020-12-12 MED ORDER — METOCLOPRAMIDE HCL 10 MG PO TABS
10.0000 mg | ORAL_TABLET | Freq: Once | ORAL | Status: AC
  Administered 2020-12-12: 10 mg via ORAL
  Filled 2020-12-12: qty 1
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
## 2020-12-13 ENCOUNTER — Encounter (HOSPITAL_COMMUNITY): Payer: Self-pay | Admitting: Obstetrics & Gynecology

## 2020-12-13 ENCOUNTER — Telehealth: Payer: Self-pay | Admitting: *Deleted

## 2020-12-13 LAB — SURGICAL PATHOLOGY

## 2020-12-13 NOTE — Telephone Encounter (Signed)
Transition Care Management Follow-up Telephone Call Date of discharge and from where: 12/12/2020 - [facility] How have you been since you were released from the hospital? "Doing fine" Any questions or concerns? No  Items Reviewed: Did the pt receive and understand the discharge instructions provided? Yes  Medications obtained and verified? Yes  Other? No  Any new allergies since your discharge? No  Dietary orders reviewed? No Do you have support at home? Yes    Functional Questionnaire: (I = Independent and D = Dependent) ADLs: I  Bathing/Dressing- I  Meal Prep- I  Eating- I  Maintaining continence- I  Transferring/Ambulation- I  Managing Meds- I  Follow up appointments reviewed:  PCP Hospital f/u appt confirmed? No   Specialist Hospital f/u appt confirmed? Yes  Scheduled to see OBGYN on 12/18/2020 @ 1020 and 01/22/2021 @ 1015. Are transportation arrangements needed? No  If their condition worsens, is the pt aware to call PCP or go to the Emergency Dept.? Yes Was the patient provided with contact information for the PCP's office or ED? Yes Was to pt encouraged to call back with questions or concerns? Yes

## 2020-12-14 ENCOUNTER — Encounter: Payer: Self-pay | Admitting: Family Medicine

## 2020-12-14 MED ORDER — FUROSEMIDE 20 MG PO TABS
20.0000 mg | ORAL_TABLET | Freq: Two times a day (BID) | ORAL | 0 refills | Status: DC
Start: 2020-12-14 — End: 2020-12-15

## 2020-12-14 MED ORDER — CYCLOBENZAPRINE HCL 10 MG PO TABS: 10.0000 mg | ORAL_TABLET | Freq: Three times a day (TID) | ORAL | 1 refills | Status: DC | PRN

## 2020-12-14 MED ORDER — IBUPROFEN 600 MG PO TABS: 600.0000 mg | ORAL_TABLET | Freq: Four times a day (QID) | ORAL | 1 refills | Status: DC | PRN

## 2020-12-14 NOTE — Addendum Note (Signed)
Addended by: Reva Bores on: 12/14/2020 04:05 PM   Modules accepted: Orders

## 2020-12-15 ENCOUNTER — Other Ambulatory Visit: Payer: Self-pay | Admitting: Family Medicine

## 2020-12-18 ENCOUNTER — Ambulatory Visit: Payer: Medicaid Other

## 2020-12-21 ENCOUNTER — Ambulatory Visit: Payer: Medicaid Other | Admitting: Neurology

## 2020-12-21 ENCOUNTER — Telehealth (HOSPITAL_COMMUNITY): Payer: Self-pay | Admitting: *Deleted

## 2020-12-21 ENCOUNTER — Encounter: Payer: Self-pay | Admitting: Neurology

## 2020-12-21 DIAGNOSIS — Z1331 Encounter for screening for depression: Secondary | ICD-10-CM

## 2020-12-21 NOTE — Telephone Encounter (Signed)
Mom reports bleeding is decreasing, no cramping, physically feeling well. BP has been ok per patient. Batteries just added to machine yesterday to send BP's to [facility]. Emotionally feeling stressed. EPDS = 14 (hospital score = 18). Reports no appointment yet with Behavioral Health, but a social worker will be coming to her house this week to determine if an appointment is needed. Nurse suggested a referal to [facility] and made mom aware that they will call her. Referral made to [facility] and Dr. Alvester Morin notified.  Mom reports baby is doing well. Formula feeding well. No concerns.  Duffy Rhody, RN 12/21/2020 at 12:12pm

## 2020-12-21 NOTE — Progress Notes (Deleted)
PATIENT: Andrea Burns DOB: April 16, 1986  REASON FOR VISIT: follow up HISTORY FROM: patient Primary Neurologist:  HISTORY OF PRESENT ILLNESS: Today 12/21/20  HISTORY  Andrea Burns is a 35 year old female, seen in request by her obstetrician Dr. Coral Ceo for evaluation of chronic migraine headaches, frequent leg muscle cramping, pain, initial evaluation was on October 06, 2020     I reviewed and summarized the referring note. PMHX HTN   This is her 5th pregnancy, due date is in July 2022,   She reported lifelong history of migraine, her typical migraine are left retro-orbital area severe pressure headache with associated light, noise, smell sensitivity, it can last up to 2 days, before pregnant she has tried different over-the-counter medications, without helping her migraine   She now complains of migraine 2-3 times each week, she has to take frequent naps, try to avoid too much medications, sleep always helps, headache overall has improved during the pregnancy,   She also complains of frequent bilateral lower extremity discomfort, muscle spasm, she has to pace around, beat on her calf muscles, or massage to alleviate the symptoms, every other night, sometimes after prolonged sitting,   She also complains of worsening anxiety, get agitated frustrated easily, she tried to keep her self busy to distract her mind, then noticed she has difficulty multitasking, has memory loss, often misplaces her things  Update December 21, 2020 SS:   Laboratory evaluation at last visit revealed normal TSH 2.020, decreased T3 Uptake 10, decreased free thyroxine index 0.8, T4 was normal.  Ferritin level was low at 7, B12 was normal.  REVIEW OF SYSTEMS: Out of a complete 14 system review of symptoms, the patient complains only of the following symptoms, and all other reviewed systems are negative.  ALLERGIES: No Known Allergies  HOME MEDICATIONS: Outpatient Medications Prior to Visit   Medication Sig Dispense Refill   acetaminophen (TYLENOL) 325 MG tablet Take 2 tablets (650 mg total) by mouth every 4 (four) hours as needed (for pain scale < 4).     albuterol (VENTOLIN HFA) 108 (90 Base) MCG/ACT inhaler Inhale 2 puffs into the lungs every 4 (four) hours as needed for wheezing or shortness of breath. 18 g 0   cyclobenzaprine (FLEXERIL) 10 MG tablet Take 1 tablet (10 mg total) by mouth every 8 (eight) hours as needed for muscle spasms. 30 tablet 1   docusate sodium (COLACE) 100 MG capsule Take 1 capsule (100 mg total) by mouth 2 (two) times daily as needed. 30 capsule 2   docusate sodium (COLACE) 100 MG capsule Take 1 capsule (100 mg total) by mouth 2 (two) times daily. 10 capsule 0   ferrous sulfate (FERROUSUL) 325 (65 FE) MG tablet Take 1 tablet (325 mg total) by mouth 2 (two) times daily. 60 tablet 1   furosemide (LASIX) 20 MG tablet TAKE 1 TABLET(20 MG) BY MOUTH TWICE DAILY FOR 3 DAYS 6 tablet 0   ibuprofen (ADVIL) 600 MG tablet Take 1 tablet (600 mg total) by mouth every 6 (six) hours as needed. 30 tablet 1   NIFEdipine (ADALAT CC) 60 MG 24 hr tablet Take 1 tablet (60 mg total) by mouth daily. 60 tablet 0   oxyCODONE (OXY IR/ROXICODONE) 5 MG immediate release tablet Take 1 tablet (5 mg total) by mouth every 4 (four) hours as needed for severe pain or breakthrough pain. 10 tablet 0   Prenatal Vit-Fe Fumarate-FA (PRENATAL MULTIVITAMIN) TABS tablet Take 1 tablet by mouth daily at 12 noon.  No facility-administered medications prior to visit.    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   Medical history non-contributory    Pneumonia    2013   Vaginal Pap smear, abnormal     PAST SURGICAL HISTORY: Past Surgical History:  Procedure Laterality Date    APPENDECTOMY     COLPOSCOPY W/ BIOPSY / CURETTAGE     IUD REMOVAL     LAPAROSCOPIC APPENDECTOMY N/A 10/18/2019   Procedure: APPENDECTOMY LAPAROSCOPIC;  Surgeon: [name], MD;  Location: [facility];  Service: General;  Laterality: N/A;   LAPAROSCOPY ABDOMEN DIAGNOSTIC     Removal of migrated IUD    TUBAL LIGATION N/A 12/11/2020   Procedure: POST PARTUM TUBAL LIGATION;  Surgeon: Tereso Newcomer, MD;  Location: [facility];  Service: Gynecology;  Laterality: N/A;    FAMILY HISTORY: Family History  Problem Relation Age of Onset   Diabetes Mother    Hypertension Mother    Cancer Mother    Diabetes Father    Cancer Paternal Grandmother        liver & lung    SOCIAL HISTORY: Social History   Socioeconomic History   Marital status: Married    Spouse name: Joseluis Juarez   Number of children: 4   Years of education: Not on file   Highest education level: Not on file  Occupational History   Not on file  Tobacco Use   Smoking status: Former    Packs/day: 0.25    Years: 1.00    Pack years: 0.25    Types: Cigarettes    Quit date: 05/03/2005    Years since quitting: 15.6   Smokeless tobacco: Never  Vaping Use   Vaping Use: Never used  Substance and Sexual Activity   Alcohol use: Not Currently    Alcohol/week: 0.0 standard drinks   Drug use: Not Currently    Types: Cocaine    Comment: last used August 2021   Sexual activity: Yes    Partners: Male    Birth control/protection: None  Other Topics Concern   Not on file  Social History Narrative   Not on file   Social Determinants of Health   Financial Resource Strain: Not on file  Food Insecurity: Food Insecurity Present   Worried About Running Out of Food in the Last Year: Sometimes true   Ran Out of Food in the Last Year: Sometimes true  Transportation Needs: No Transportation Needs   Lack of Transportation (Medical): No   Lack of Transportation (Non-Medical): No  Physical Activity: Not on file  Stress: Not on  file  Social Connections: Not on file  Intimate Partner Violence: Not on file      PHYSICAL EXAM  There were no vitals filed for this visit. There is no height or weight on file to calculate BMI.  Generalized: Well developed, in no acute distress   Neurological examination  Mentation: Alert oriented to time, place, history taking. Follows all commands speech and language fluent Cranial nerve II-XII: Pupils were equal round reactive to light. Extraocular movements were full, visual field were full on confrontational test. Facial sensation and strength were normal. Uvula tongue midline. Head turning and shoulder shrug  were normal and  symmetric. Motor: The motor testing reveals 5 over 5 strength of all 4 extremities. Good symmetric motor tone is noted throughout.  Sensory: Sensory testing is intact to soft touch on all 4 extremities. No evidence of extinction is noted.  Coordination: Cerebellar testing reveals good finger-nose-finger and heel-to-shin bilaterally.  Gait and station: Gait is normal. Tandem gait is normal. Romberg is negative. No drift is seen.  Reflexes: Deep tendon reflexes are symmetric and normal bilaterally.   DIAGNOSTIC DATA (LABS, IMAGING, TESTING) - I reviewed patient records, labs, notes, testing and imaging myself where available.  Lab Results  Component Value Date   WBC 9.9 12/10/2020   HGB 11.5 (L) 12/10/2020   HCT 34.2 (L) 12/10/2020   MCV 87.5 12/10/2020   PLT 233 12/10/2020      Component Value Date/Time   NA 135 12/10/2020 1440   NA 143 10/19/2020 0926   K 3.8 12/10/2020 1440   CL 109 12/10/2020 1440   CO2 20 (L) 12/10/2020 1440   GLUCOSE 90 12/10/2020 1440   BUN 7 12/10/2020 1440   BUN 7 10/19/2020 0926   CREATININE 0.73 12/10/2020 1440   CALCIUM 9.2 12/10/2020 1440   PROT 6.2 (L) 12/10/2020 1440   PROT 6.6 10/19/2020 0926   ALBUMIN 2.8 (L) 12/10/2020 1440   ALBUMIN 3.7 (L) 10/19/2020 0926   AST 19 12/10/2020 1440   ALT 15 12/10/2020  1440   ALKPHOS 99 12/10/2020 1440   BILITOT 0.4 12/10/2020 1440   BILITOT <0.2 10/19/2020 0926   GFRNONAA >60 12/10/2020 1440   GFRAA 125 05/19/2020 1123   No results found for: CHOL, HDL, LDLCALC, LDLDIRECT, TRIG, CHOLHDL Lab Results  Component Value Date   HGBA1C 4.9 05/19/2020   Lab Results  Component Value Date   VITAMINB12 404 10/06/2020   Lab Results  Component Value Date   TSH 2.020 10/06/2020      ASSESSMENT AND PLAN 35 y.o. year old female  has a past medical history of Anemia, Anxiety, Asthma, Depression, GERD (gastroesophageal reflux disease), HA (headache), Hypertension, IUD migration, Medical history non-contributory, Pneumonia, and Vaginal Pap smear, abnormal. here with:  Chronic migraine headaches Leg muscle cramping, restless leg Worsening depression, memory loss             Patient is currently 7 months pregnant,             Will check iron level, B12 level, to rule out treatable etiology, goal ferritin more than 50,             Fioricet as needed for migraine, may combine it with Zofran             Return to clinic in 2 months,             May consider further evaluation, and treatment postpartum, such as nortriptyline as migraine prevention, sleep, gabapentin for her restless leg symptoms   I spent 15 minutes with the patient. 50% of this time was spent   Margie Ege, [place], DNP 12/21/2020, 5:48 AM [facility] [address], [address] [place], [place] [address] [phone]

## 2021-01-08 DIAGNOSIS — Z419 Encounter for procedure for purposes other than remedying health state, unspecified: Secondary | ICD-10-CM | POA: Diagnosis not present

## 2021-01-22 ENCOUNTER — Other Ambulatory Visit: Payer: Self-pay

## 2021-01-22 ENCOUNTER — Ambulatory Visit (INDEPENDENT_AMBULATORY_CARE_PROVIDER_SITE_OTHER): Payer: Medicaid Other | Admitting: Obstetrics and Gynecology

## 2021-01-22 DIAGNOSIS — O165 Unspecified maternal hypertension, complicating the puerperium: Secondary | ICD-10-CM | POA: Insufficient documentation

## 2021-01-22 DIAGNOSIS — K8020 Calculus of gallbladder without cholecystitis without obstruction: Secondary | ICD-10-CM | POA: Diagnosis not present

## 2021-01-22 NOTE — Progress Notes (Signed)
Reports continued problem with frequent headaches. Reports upper abdominal pain with "everything I eat" Reports history of gallstones in pregnancy.

## 2021-01-22 NOTE — Progress Notes (Signed)
Post Partum Visit Note  Andrea Burns is a 35 y.o. [id number] female who presents for a postpartum visit. She is 6 weeks postpartum following a normal spontaneous vaginal delivery.  I have fully reviewed the prenatal and intrapartum course. The delivery was at 37.5 gestational weeks.  Anesthesia: IV sedation. Postpartum course has been complicated by elevated BP. Baby is doing well. Baby is feeding by bottle - Similac Advance. Bleeding staining only. Bowel function is normal. Bladder function is normal. Patient is not sexually active. Contraception method is tubal ligation. Postpartum depression screening: positive = 9. [facility] referral offered and declined.   The pregnancy intention screening data noted above was reviewed. Potential methods of contraception were discussed. The patient elected to proceed with No data recorded.   Edinburgh Postnatal Depression Scale - 01/22/21 1026       Edinburgh Postnatal Depression Scale:  In the Past 7 Days   I have been able to laugh and see the funny side of things. 0    I have looked forward with enjoyment to things. 0    I have blamed myself unnecessarily when things went wrong. 2    I have been anxious or worried for no good reason. 1    I have felt scared or panicky for no good reason. 2    Things have been getting on top of me. 0    I have been so unhappy that I have had difficulty sleeping. 2    I have felt sad or miserable. 1    I have been so unhappy that I have been crying. 1    The thought of harming myself has occurred to me. 0    Edinburgh Postnatal Depression Scale Total 9             Health Maintenance Due  Topic Date Due   COVID-19 Vaccine (1) Never done   Pneumococcal Vaccine 5-70 Years old (1 - PCV) Never done   INFLUENZA VACCINE  01/08/2021    The following portions of the patient's history were reviewed and updated as appropriate: allergies, current medications, past family history, past medical history, past social  history, past surgical history, and problem list.  Review of Systems Pertinent items are noted in HPI.  Objective:  BP (!) 138/92   Pulse 74   Ht 5\' 2"  (1.575 m)   Wt 183 lb 6.4 oz (83.2 kg)   LMP  (LMP Unknown)   BMI 33.54 kg/m    General:  alert, cooperative, and no distress   Breasts:  not indicated  Lungs: clear to auscultation bilaterally  Heart:  regular rate and rhythm  Abdomen: soft, non-tender; bowel sounds normal; no masses,  no organomegaly and no RUQ pain    Wound well approximated incision  GU exam:  not indicated       Assessment:     normal postpartum exam.   Plan:   Essential components of care per ACOG recommendations:  1.  Mood and well being: Patient with equivocal depression screening today. Reviewed local resources for support. Pt is aware of resources but currently declines intervention. - Patient tobacco use? No.   - hx of drug use? Yes. Discussed support systems and outpatient/inpatient treatment options.  No recent use, hx of cocaine use  2. Infant care and feeding:  -Patient currently breastmilk feeding? No.  -Social determinants of health (SDOH) reviewed in EPIC. No concerns.  3. Sexuality, contraception and birth spacing - Patient does not want a  pregnancy in the next year.  Desired family size is 6 children.  - Reviewed forms of contraception in tiered fashion. Patient desired bilateral tubal ligation today.   - Discussed birth spacing of 18 months  4. Sleep and fatigue -Encouraged family/partner/community support of 4 hrs of uninterrupted sleep to help with mood and fatigue  5. Physical Recovery  - Discussed patients delivery and complications. She describes her labor as good. - Patient had a Vaginal, no problems at delivery. Patient had   no  laceration. Perineal healing reviewed. Patient expressed understanding - Patient has urinary incontinence? No. - Patient is safe to resume physical and sexual activity  6.  Health Maintenance -  HM due items addressed Yes - Last pap smear  Diagnosis  Date Value Ref Range Status  01/31/2020 - Benign reactive/reparative changes  Final   Pap smear not done at today's visit.  -Breast Cancer screening indicated? No.   7. Chronic Disease/Pregnancy Condition follow up: Hypertension and gallstones  - PCP follow up:  will refer to Family medicine due to continued hypertension after postpartum period while still on medications.  2. Refer to general surgery for further evaluation and treatment of gallstones.  Warden Fillers, MD [facility] [employer], [facility]

## 2021-01-24 ENCOUNTER — Ambulatory Visit: Payer: Self-pay

## 2021-01-31 ENCOUNTER — Other Ambulatory Visit: Payer: Self-pay | Admitting: Licensed Clinical Social Worker

## 2021-01-31 NOTE — Patient Outreach (Signed)
Medicaid Managed Care Social Work Note  01/31/2021 Name:  Andrea Burns MRN:  [id number] DOB:  [birth year]  Andrea Burns is an 35 y.o. year old female who is a primary patient of Patient, No Pcp Per (Inactive).  The Medicaid Managed Care Coordination team was consulted for assistance with:  Mental Health Counseling and Resources  Ms. Persad was given information about Medicaid Managed Care Coordination team services today. Andrea Burns Patient agreed to services and verbal consent obtained.  Engaged with patient  for by telephone forinitial visit in response to referral for case management and/or care coordination services.   Assessments/Interventions:  Review of past medical history, allergies, medications, health status, including review of consultants reports, laboratory and other test data, was performed as part of comprehensive evaluation and provision of chronic care management services.  SDOH: (Social Determinant of Health) assessments and interventions performed: SDOH Interventions    Flowsheet Row Most Recent Value  SDOH Interventions   SDOH Interventions for the Following Domains Depression, Stress  Stress Interventions Provide Counseling, Offered Community Wellness Resources  Depression Interventions/Treatment  --  Bill Salinas made for counseling]       Advanced Directives Status:  See Care Plan for related entries.  Care Plan                 No Known Allergies  Medications Reviewed Today     Reviewed by Gustavus Bryant, LCSW (Social Worker) on 01/31/21 at 1331  Med List Status: <None>   Medication Order Taking? Sig Documenting Provider Last Dose Status Informant  acetaminophen (TYLENOL) 325 MG tablet [id number] No Take 2 tablets (650 mg total) by mouth every 4 (four) hours as needed (for pain scale < 4). Gita Kudo, MD Taking Active   albuterol (VENTOLIN HFA) 108 (90 Base) MCG/ACT inhaler [id number] No Inhale 2 puffs into the lungs every 4 (four)  hours as needed for wheezing or shortness of breath. Mickie Bail, NP Taking Active   cyclobenzaprine (FLEXERIL) 10 MG tablet [id number] No Take 1 tablet (10 mg total) by mouth every 8 (eight) hours as needed for muscle spasms. Reva Bores, MD Taking Active   docusate sodium (COLACE) 100 MG capsule [id number] No Take 1 capsule (100 mg total) by mouth 2 (two) times daily as needed. Constant, Peggy, MD Taking Active   docusate sodium (COLACE) 100 MG capsule [id number]  Take 1 capsule (100 mg total) by mouth 2 (two) times daily. Gita Kudo, MD  Active   ferrous sulfate (FERROUSUL) 325 (65 FE) MG tablet [id number] No Take 1 tablet (325 mg total) by mouth 2 (two) times daily.  Patient not taking: Reported on 01/22/2021   Constant, Peggy, MD Not Taking Active   furosemide (LASIX) 20 MG tablet [id number]  TAKE 1 TABLET(20 MG) BY MOUTH TWICE DAILY FOR 3 DAYS Reva Bores, MD  Active   ibuprofen (ADVIL) 600 MG tablet [id number] No Take 1 tablet (600 mg total) by mouth every 6 (six) hours as needed. Reva Bores, MD Taking Active   NIFEdipine (ADALAT CC) 60 MG 24 hr tablet [id number] No Take 1 tablet (60 mg total) by mouth daily. Alric Seton, MD Taking Active   oxyCODONE (OXY IR/ROXICODONE) 5 MG immediate release tablet [id number] No Take 1 tablet (5 mg total) by mouth every 4 (four) hours as needed for severe pain or breakthrough pain.  Patient not taking: Reported on 01/22/2021   Gita Kudo, MD Not Taking Active   Prenatal  Vit-Fe Fumarate-FA (PRENATAL MULTIVITAMIN) TABS tablet [id number] No Take 1 tablet by mouth daily at 12 noon. [provider] Taking Active             Patient Active Problem List   Diagnosis Date Noted   Encounter for postpartum visit 01/22/2021   Postpartum hypertension 01/22/2021   Gallstones without obstruction of gallbladder 01/22/2021   Vaginal delivery 12/11/2020   Status post bilateral salpingectomy 12/11/2020   Chronic migraine w/o aura w/o  status migrainosus, not intractable 10/06/2020   History of substance abuse (HCC) 05/23/2020   Supervision of high risk pregnancy, antepartum 05/19/2020   Chronic hypertension affecting pregnancy 05/17/2020   Depression affecting pregnancy 08/12/2013    Conditions to be addressed/monitored per PCP order:  Depression  Care Plan : Social work plan of care  Updates made by Gustavus Bryant, LCSW since 01/31/2021 12:00 AM     Problem: Depression Identification (Depression)      Long-Range Goal: Depressive Symptoms Identified   Start Date: 01/31/2021  Priority: High  Note:   Timeframe:  Long-Range Goal Priority:  High Start Date:     01/31/21                        Expected End Date:  ongoing               Follow Up Date 02/08/21   Current barriers:   Chronic Mental Health needs related to Postpartum depression Limited social support and Mental Health Concerns  Needs Support, Education, and Care Coordination in order to meet unmet mental health needs. Clinical Goal(s): demonstrate a reduction in symptoms related to :Dementia  and connect with provider for ongoing mental health treatment.    explore community resource options for unmet needs related BT:DVVO: Social Isolation  patient will work with SW to address concerns related to finding a Theatre manager that is bilingual    Clinical Interventions:  Patient is a 35 yo female that is experiencing postpartum depression with limited support is looking for a long term Runner, broadcasting/film/video. [facility] LCSW contacted [facility] and was informed that they do not have a Runner, broadcasting/film/video available but can have interpreters. Patient would prefer a spanish speaking counselor and is agreeable to [facility] LCSW placed a referral through [facility]. Referral made on 01/31/21.   Inter-disciplinary care team collaboration (see longitudinal plan of care) Assessed patient's previous and current treatment, coping skills, support system and barriers to care  LCSW  discussed coping skills for depression. SW used empathetic and active and reflective listening, validated patient's feelings/concerns, and provided emotional support. LCSW provided self-care education as well.  Review various resources, discussed options and provided patient information about Options for mental health treatment based on need and insurance Depression screen reviewed , PHQ2/ PHQ9 completed, Solution-Focused Strategies, Mindfulness or Relaxation Training, Active listening / Reflection utilized , Emotional Supportive Provided, Quality of sleep assessed & Sleep Hygiene techniques promoted , Participation in counseling encouraged , Increase in actives / exercise encouraged , Crisis Resource Education / information provided , Suicidal Ideation/Homicidal Ideation assessed:, Discussed Health Care Power of Attorney , and Discussed referral to [facility] to assist with connecting to mental health provider ; Patient Goals/Self-Care Activities: Over the next 120 days - participation in mental health treatment encouraged - self-awareness of emotional triggers encouraged - strategies to manage emotional triggers promoted - suicide risk screen reviewed - avoid negative self-talk - develop a personal safety plan - develop a plan to deal  with triggers like holidays, anniversaries - exercise at least 2 to 3 times per week - have a plan for how to handle bad days - journal feelings and what helps to feel better or worse - spend time or talk with others at least 2 to 3 times per week - spend time or talk with others every day - watch for early signs of feeling worse - write in journal every day - begin personal counseling - call and visit an old friend - check out volunteer opportunities - join a support group - laugh; watch a funny movie or comedian - learn and use visualization or guided imagery - perform a random act of kindness - practice relaxation or meditation daily - start or continue a  personal journal - talk about feelings with a friend, family or spiritual advisor - practice positive thinking and self-talk I have placed a referral with [facility] to assist with connecting you with a mental health provider. they will contact you once a provider is located.  Depression screen [facility] 2/9 01/31/2021 11/15/2020 10/03/2020 05/19/2020 08/12/2013  Decreased Interest 2 2 3 3 3   Down, Depressed, Hopeless 3 3 1 1 3   PHQ - 2 Score 5 5 4 4 6   Altered sleeping 3 3 1 2 1   Tired, decreased energy 2 2 3 1 1   Change in appetite 1 2 2 1 3   Feeling bad or failure about yourself  2 2 1 2 1   Trouble concentrating 2 2 2 2 1   Moving slowly or fidgety/restless 0 3 2 0 1  Suicidal thoughts 0 0 0 0 1  PHQ-9 Score 15 19 15 12 15   Difficult doing work/chores Very difficult - Somewhat difficult Somewhat difficult -       Follow up:  Patient agrees to Care Plan and Follow-up.  Plan: The Managed Medicaid care management team will reach out to the patient again over the next 30 days.  Date of next scheduled Social Work care management/care coordination outreach:  02/08/21  , BSW, MSW, LCSW Managed Medicaid LCSW [facility]  [facility] [email or web address] Phone: [phone]

## 2021-01-31 NOTE — Patient Instructions (Signed)
Visit Information  Ms. Bohne was given information about Medicaid Managed Care team care coordination services as a part of their [facility] Medicaid benefit. Corbin Ade Saksa verbally consented to engagement with the [facility] Managed Care team.   If you are experiencing a medical emergency, please call 911 or report to your local emergency department or urgent care.   If you have a non-emergency medical problem during routine business hours, please contact your provider's office and ask to speak with a nurse.   For questions related to your [facility] health plan, please call: [phone] or go here:[email or web address]  If you would like to schedule transportation through your [facility] plan, please call the following number at least 2 days in advance of your appointment: [phone].  Call the [facility] Crisis Line at [phone], at any time, 24 hours a day, 7 days a week. If you are in danger or need immediate medical attention call 911.  If you would like help to quit smoking, call 1-800-QUIT-NOW (586 758 6275) OR Espaol: 1-855-Djelo-Ya (3-382-505-3976) o para ms informacin haga clic aqu or Text READY to 734-193 to register via text  Ms. Persichetti - following are the goals we discussed in your visit today:   Goals Addressed             This Visit's Progress    Manage My Emotions       Timeframe:  Long-Range Goal Priority:  High Start Date:     01/31/21                        Expected End Date:  ongoing               Follow Up Date 02/08/21    - begin personal counseling - call and visit an old friend - check out volunteer opportunities - join a support group - laugh; watch a funny movie or comedian - learn and use visualization or guided imagery - perform a random act of kindness - practice relaxation or meditation daily - start or continue a personal journal - talk about feelings with a friend, family or spiritual advisor -  practice positive thinking and self-talk    Why is this important?   When you are stressed, down or upset, your body reacts too.  For example, your blood pressure may get higher; you may have a headache or stomachache.  When your emotions get the best of you, your body's ability to fight off cold and flu gets weak.  These steps will help you manage your emotions.     Notes:         [name], BSW, MSW, [employer] Managed Medicaid LCSW [facility]  [facility] [email or web address] Phone: [phone]

## 2021-02-08 ENCOUNTER — Other Ambulatory Visit: Payer: Self-pay | Admitting: Licensed Clinical Social Worker

## 2021-02-08 DIAGNOSIS — Z419 Encounter for procedure for purposes other than remedying health state, unspecified: Secondary | ICD-10-CM | POA: Diagnosis not present

## 2021-02-08 NOTE — Patient Outreach (Signed)
Medicaid Managed Care Social Work Note  02/08/2021 Name:  Andrea Burns MRN:  [id number] DOB:  Nov 03, 1985  Andrea Burns is an 35 y.o. year old female who is a primary patient of Patient, No Pcp Per (Inactive).  The Medicaid Managed Care Coordination team was consulted for assistance with:  Mental Health Counseling and Resources  Andrea Burns was given information about Medicaid Managed Care Coordination team services today. Andrea Burns Patient agreed to services and verbal consent obtained.  Engaged with patient  for by telephone forfollow up visit in response to referral for case management and/or care coordination services.   Assessments/Interventions:  Review of past medical history, allergies, medications, health status, including review of consultants reports, laboratory and other test data, was performed as part of comprehensive evaluation and provision of chronic care management services.  SDOH: (Social Determinant of Health) assessments and interventions performed: SDOH Interventions    Flowsheet Row Most Recent Value  SDOH Interventions   Stress Interventions Provide Counseling  Depression Interventions/Treatment  Referral to Psychiatry       Advanced Directives Status:  See Care Plan for related entries.  Care Plan                 No Known Allergies  Medications Reviewed Today     Reviewed by Gustavus Bryant, LCSW (Social Worker) on 01/31/21 at 1331  Med List Status: <None>   Medication Order Taking? Sig Documenting Provider Last Dose Status Informant  acetaminophen (TYLENOL) 325 MG tablet [id number] No Take 2 tablets (650 mg total) by mouth every 4 (four) hours as needed (for pain scale < 4). Gita Kudo, MD Taking Active   albuterol (VENTOLIN HFA) 108 (90 Base) MCG/ACT inhaler [id number] No Inhale 2 puffs into the lungs every 4 (four) hours as needed for wheezing or shortness of breath. Mickie Bail, NP Taking Active   cyclobenzaprine (FLEXERIL) 10 MG  tablet [id number] No Take 1 tablet (10 mg total) by mouth every 8 (eight) hours as needed for muscle spasms. Reva Bores, MD Taking Active   docusate sodium (COLACE) 100 MG capsule [id number] No Take 1 capsule (100 mg total) by mouth 2 (two) times daily as needed. Constant, Peggy, MD Taking Active   docusate sodium (COLACE) 100 MG capsule [id number]  Take 1 capsule (100 mg total) by mouth 2 (two) times daily. Gita Kudo, MD  Active   ferrous sulfate (FERROUSUL) 325 (65 FE) MG tablet [id number] No Take 1 tablet (325 mg total) by mouth 2 (two) times daily.  Patient not taking: Reported on 01/22/2021   Constant, Peggy, MD Not Taking Active   furosemide (LASIX) 20 MG tablet [id number]  TAKE 1 TABLET(20 MG) BY MOUTH TWICE DAILY FOR 3 DAYS Reva Bores, MD  Active   ibuprofen (ADVIL) 600 MG tablet [id number] No Take 1 tablet (600 mg total) by mouth every 6 (six) hours as needed. Reva Bores, MD Taking Active   NIFEdipine (ADALAT CC) 60 MG 24 hr tablet [id number] No Take 1 tablet (60 mg total) by mouth daily. Alric Seton, MD Taking Active   oxyCODONE (OXY IR/ROXICODONE) 5 MG immediate release tablet [id number] No Take 1 tablet (5 mg total) by mouth every 4 (four) hours as needed for severe pain or breakthrough pain.  Patient not taking: Reported on 01/22/2021   Gita Kudo, MD Not Taking Active   Prenatal Vit-Fe Fumarate-FA (PRENATAL MULTIVITAMIN) TABS tablet [id number] No Take 1 tablet by mouth daily at  12 noon. [provider] Taking Active             Patient Active Problem List   Diagnosis Date Noted   Encounter for postpartum visit 01/22/2021   Postpartum hypertension 01/22/2021   Gallstones without obstruction of gallbladder 01/22/2021   Vaginal delivery 12/11/2020   Status post bilateral salpingectomy 12/11/2020   Chronic migraine w/o aura w/o status migrainosus, not intractable 10/06/2020   History of substance abuse (HCC) 05/23/2020   Supervision of high risk  pregnancy, antepartum 05/19/2020   Chronic hypertension affecting pregnancy 05/17/2020   Depression affecting pregnancy 08/12/2013    Conditions to be addressed/monitored per PCP order:  Anxiety and Depression  Care Plan : Social work plan of care  Updates made by Gustavus Bryant, LCSW since 02/08/2021 12:00 AM     Problem: Depression Identification (Depression)      Long-Range Goal: Depressive Symptoms Identified   Start Date: 01/31/2021  Priority: High  Note:   Timeframe:  Long-Range Goal Priority:  High Start Date:     01/31/21                        Expected End Date:  ongoing               Follow Up Date 02/26/21   Current barriers:   Chronic Mental Health needs related to Postpartum depression Limited social support and Mental Health Concerns  Needs Support, Education, and Care Coordination in order to meet unmet mental health needs. Clinical Goal(s): demonstrate a reduction in symptoms related to :Dementia  and connect with provider for ongoing mental health treatment.    explore community resource options for unmet needs related IW:LNLG: Social Isolation  patient will work with SW to address concerns related to finding a Theatre manager that is bilingual    Clinical Interventions:  Patient is a 35 yo female that is experiencing postpartum depression with limited support is looking for a long term Runner, broadcasting/film/video. [facility] LCSW contacted [facility] and was informed that they do not have a Runner, broadcasting/film/video available but can have interpreters. Patient would prefer a spanish speaking counselor and is agreeable to [facility] LCSW placed a referral through [facility]. Referral made on 01/31/21.    Update 02/08/21- [facility] has attempted to reach patient several times. Patient was advised to contact their customer service line today as they have matched her with a provider. Patient agreeable to do so today. Inter-disciplinary care team collaboration (see longitudinal plan of care) Assessed  patient's previous and current treatment, coping skills, support system and barriers to care  LCSW discussed coping skills for depression. SW used empathetic and active and reflective listening, validated patient's feelings/concerns, and provided emotional support. LCSW provided self-care education as well.  Review various resources, discussed options and provided patient information about Options for mental health treatment based on need and insurance Depression screen reviewed , PHQ2/ PHQ9 completed, Solution-Focused Strategies, Mindfulness or Relaxation Training, Active listening / Reflection utilized , Emotional Supportive Provided, Quality of sleep assessed & Sleep Hygiene techniques promoted , Participation in counseling encouraged , Increase in actives / exercise encouraged , Crisis Resource Education / information provided , Suicidal Ideation/Homicidal Ideation assessed:, Discussed Health Care Power of Attorney , and Discussed referral to [facility] to assist with connecting to mental health provider ; Patient Goals/Self-Care Activities: Over the next 120 days - participation in mental health treatment encouraged - self-awareness of emotional triggers encouraged - strategies to manage emotional triggers  promoted - suicide risk screen reviewed - avoid negative self-talk - develop a personal safety plan - develop a plan to deal with triggers like holidays, anniversaries - exercise at least 2 to 3 times per week - have a plan for how to handle bad days - journal feelings and what helps to feel better or worse - spend time or talk with others at least 2 to 3 times per week - spend time or talk with others every day - watch for early signs of feeling worse - write in journal every day - begin personal counseling - call and visit an old friend - check out volunteer opportunities - join a support group - laugh; watch a funny movie or comedian - learn and use visualization or guided imagery -  perform a random act of kindness - practice relaxation or meditation daily - start or continue a personal journal - talk about feelings with a friend, family or spiritual advisor - practice positive thinking and self-talk I have placed a referral with [facility] to assist with connecting you with a mental health provider. they will contact you once a provider is located.  Depression screen [facility] 2/9 01/31/2021 11/15/2020 10/03/2020 05/19/2020 08/12/2013  Decreased Interest 2 2 3 3 3   Down, Depressed, Hopeless 3 3 1 1 3   PHQ - 2 Score 5 5 4 4 6   Altered sleeping 3 3 1 2 1   Tired, decreased energy 2 2 3 1 1   Change in appetite 1 2 2 1 3   Feeling bad or failure about yourself  2 2 1 2 1   Trouble concentrating 2 2 2 2 1   Moving slowly or fidgety/restless 0 3 2 0 1  Suicidal thoughts 0 0 0 0 1  PHQ-9 Score 15 19 15 12 15   Difficult doing work/chores Very difficult - Somewhat difficult Somewhat difficult -       Follow up:  Patient agrees to Care Plan and Follow-up.  Plan: The Managed Medicaid care management team will reach out to the patient again over the next 30 days.  Date of next scheduled Social Work care management/care coordination outreach:  02/26/21   , BSW, MSW, LCSW Managed Medicaid LCSW [facility]  [facility] [email or web address] Phone: [phone]

## 2021-02-08 NOTE — Patient Instructions (Signed)
Visit Information  Ms. App was given information about Medicaid Managed Care team care coordination services as a part of their [facility] Medicaid benefit. Corbin Ade Laramee verbally consented to engagement with the [facility] Managed Care team.   If you are experiencing a medical emergency, please call 911 or report to your local emergency department or urgent care.   If you have a non-emergency medical problem during routine business hours, please contact your provider's office and ask to speak with a nurse.   For questions related to your [facility] health plan, please call: [phone] or go here:[email or web address]  If you would like to schedule transportation through your [facility] plan, please call the following number at least 2 days in advance of your appointment: [phone].  Call the [facility] Crisis Line at [phone], at any time, 24 hours a day, 7 days a week. If you are in danger or need immediate medical attention call 911.  If you would like help to quit smoking, call 1-800-QUIT-NOW (541-326-7679) OR Espaol: 1-855-Djelo-Ya (7-048-889-1694) o para ms informacin haga clic aqu or Text READY to 503-888 to register via text  Ms. Kraska - following are the goals we discussed in your visit today:   Goals Addressed             This Visit's Progress    Manage My Emotions       Timeframe:  Long-Range Goal Priority:  High Start Date:     01/31/21                        Expected End Date:  ongoing               Follow Up Date 02/26/21   - begin personal counseling - call and visit an old friend - check out volunteer opportunities - join a support group - laugh; watch a funny movie or comedian - learn and use visualization or guided imagery - perform a random act of kindness - practice relaxation or meditation daily - start or continue a personal journal - talk about feelings with a friend, family or spiritual advisor -  practice positive thinking and self-talk    Why is this important?   When you are stressed, down or upset, your body reacts too.  For example, your blood pressure may get higher; you may have a headache or stomachache.  When your emotions get the best of you, your body's ability to fight off cold and flu gets weak.  These steps will help you manage your emotions.     Notes:        [name], BSW, MSW, [employer] Managed Medicaid LCSW [facility]  [facility] [email or web address] Phone: [phone]

## 2021-02-22 ENCOUNTER — Ambulatory Visit
Admission: EM | Admit: 2021-02-22 | Discharge: 2021-02-22 | Disposition: A | Discharge status: Home / Self Care | Payer: Medicaid Other | Attending: Emergency Medicine | Admitting: Emergency Medicine

## 2021-02-22 ENCOUNTER — Encounter: Payer: Self-pay | Admitting: *Deleted

## 2021-02-22 ENCOUNTER — Other Ambulatory Visit: Payer: Self-pay

## 2021-02-22 DIAGNOSIS — L600 Ingrowing nail: Secondary | ICD-10-CM | POA: Diagnosis not present

## 2021-02-22 MED ORDER — MUPIROCIN 2 % EX OINT: 1.0000 "application " | TOPICAL_OINTMENT | Freq: Two times a day (BID) | CUTANEOUS | 0 refills | Status: DC

## 2021-02-22 MED ORDER — DOXYCYCLINE HYCLATE 100 MG PO CAPS: 100.0000 mg | ORAL_CAPSULE | Freq: Two times a day (BID) | ORAL | 0 refills | Status: DC

## 2021-02-22 NOTE — Discharge Instructions (Signed)
Soak foot in warm, soapy water for 10-20 minutes three times a day for one to two weeks, pushing lateral nail fold away from nail plate You may also try placing a cotton wedging or dental floss underneath the lateral nail plate to separate the nail plate form the lateral nail fold Bactroban and doxycycline prescribed for infection Follow up with podiatry Go to the ED if you have increased redness, swelling, drainage, fever, chills, nausea, vomiting, etc..Marland Kitchen

## 2021-02-22 NOTE — ED Triage Notes (Signed)
Pt reports a ingrown toe nail and is unable to get an appt with podiatrist retrotonsillar abscess great toe red and swollen.

## 2021-02-22 NOTE — ED Provider Notes (Signed)
[facility]   [id number] 02/22/21 Arrival Time: 1049   CC: Infected ingrown toenail.    SUBJECTIVE:  Andrea Burns is a 35 y.o. female who presents with a infected RT ingrown toenail x few days. Tried picking it out herself, but was unable to.  Made appt with podiatrist, but can't get in until 10/13.  Denies fever, chills, nausea, vomiting, drainage.  Reports redness and swelling.    ROS: As per HPI.  All other pertinent ROS negative.     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   Medical history non-contributory    Pneumonia    2013   Vaginal Pap smear, abnormal    Past Surgical History:  Procedure Laterality Date   APPENDECTOMY     COLPOSCOPY W/ BIOPSY / CURETTAGE     IUD REMOVAL     LAPAROSCOPIC APPENDECTOMY N/A 10/18/2019   Procedure: APPENDECTOMY LAPAROSCOPIC;  Surgeon: [name], MD;  Location: [facility] ORS;  Service: General;  Laterality: N/A;   LAPAROSCOPY ABDOMEN DIAGNOSTIC     Removal of migrated IUD    TUBAL LIGATION N/A 12/11/2020   Procedure: POST PARTUM TUBAL LIGATION;  Surgeon: Tereso Newcomer, MD;  Location: [facility] LD ORS;  Service: Gynecology;  Laterality: N/A;   No Known Allergies No current facility-administered medications on file prior to encounter.   Current Outpatient Medications on File Prior to Encounter  Medication Sig Dispense Refill   acetaminophen (TYLENOL) 325 MG tablet Take 2 tablets (650 mg total) by mouth every 4 (four) hours as needed (for pain scale < 4).     albuterol (VENTOLIN HFA) 108 (90 Base) MCG/ACT inhaler Inhale 2 puffs into the lungs every 4 (four) hours as needed for wheezing or shortness of breath. 18 g 0   cyclobenzaprine (FLEXERIL) 10 MG tablet Take 1 tablet (10 mg total) by mouth every 8 (eight) hours as needed for  muscle spasms. 30 tablet 1   docusate sodium (COLACE) 100 MG capsule Take 1 capsule (100 mg total) by mouth 2 (two) times daily as needed. 30 capsule 2   docusate sodium (COLACE) 100 MG capsule Take 1 capsule (100 mg total) by mouth 2 (two) times daily. 10 capsule 0   ferrous sulfate (FERROUSUL) 325 (65 FE) MG tablet Take 1 tablet (325 mg total) by mouth 2 (two) times daily. (Patient not taking: Reported on 01/22/2021) 60 tablet 1   furosemide (LASIX) 20 MG tablet TAKE 1 TABLET(20 MG) BY MOUTH TWICE DAILY FOR 3 DAYS 6 tablet 0   ibuprofen (ADVIL) 600 MG tablet Take 1 tablet (600 mg total) by mouth every 6 (six) hours as needed. 30 tablet 1   NIFEdipine (ADALAT CC) 60 MG 24 hr tablet Take 1 tablet (60 mg total) by mouth daily. 60 tablet 0   oxyCODONE (OXY IR/ROXICODONE) 5 MG immediate release tablet Take 1 tablet (5 mg total) by mouth every 4 (four) hours as needed for severe pain or breakthrough pain. (Patient not taking: Reported on 01/22/2021) 10 tablet 0   Prenatal Vit-Fe Fumarate-FA (PRENATAL MULTIVITAMIN) TABS tablet Take 1 tablet by mouth daily at 12 noon.     Social History   Socioeconomic History   Marital status: Married    Spouse name: Sherrlyn Hock  Number of children: 4   Years of education: Not on file   Highest education level: Not on file  Occupational History   Not on file  Tobacco Use   Smoking status: Former    Packs/day: 0.25    Years: 1.00    Pack years: 0.25    Types: Cigarettes    Quit date: 05/03/2005    Years since quitting: 15.8   Smokeless tobacco: Never  Vaping Use   Vaping Use: Never used  Substance and Sexual Activity   Alcohol use: Not Currently    Alcohol/week: 0.0 standard drinks   Drug use: Not Currently    Types: Cocaine    Comment: last used August 2021   Sexual activity: Yes    Partners: Male    Birth control/protection: None  Other Topics Concern   Not on file  Social History Narrative   Not on file   Social Determinants of Health    Financial Resource Strain: Not on file  Food Insecurity: Food Insecurity Present   Worried About Running Out of Food in the Last Year: Sometimes true   Ran Out of Food in the Last Year: Sometimes true  Transportation Needs: No Transportation Needs   Lack of Transportation (Medical): No   Lack of Transportation (Non-Medical): No  Physical Activity: Not on file  Stress: Not on file  Social Connections: Not on file  Intimate Partner Violence: Not on file   Family History  Problem Relation Age of Onset   Diabetes Mother    Hypertension Mother    Cancer Mother    Diabetes Father    Cancer Paternal Grandmother        liver & lung    OBJECTIVE:  Vitals:   02/22/21 1059  BP: (!) 139/98  Pulse: 85  Resp: 18  Temp: 98.1 F (36.7 C)  SpO2: 97%     General appearance: alert; no distress CV: dorsalis pedis pulse 2+ Skin: erythema, swelling, and drainage to medial aspect of RT toenail fold, mildly TTP, no obvious drainage Psychological: alert and cooperative; normal mood and affect   ASSESSMENT & PLAN:  1. Ingrown toenail of right foot with infection     Meds ordered this encounter  Medications   doxycycline (VIBRAMYCIN) 100 MG capsule    Sig: Take 1 capsule (100 mg total) by mouth 2 (two) times daily.    Dispense:  20 capsule    Refill:  0    Order Specific Question:   Supervising Provider    Answer:   Eustace Moore [[id number]]   mupirocin ointment (BACTROBAN) 2 %    Sig: Apply 1 application topically 2 (two) times daily.    Dispense:  22 g    Refill:  0    Order Specific Question:   Supervising Provider    Answer:   Eustace Moore [[id number]]   Soak foot in warm, soapy water for 10-20 minutes three times a day for one to two weeks, pushing lateral nail fold away from nail plate You may also try placing a cotton wedging or dental floss underneath the lateral nail plate to separate the nail plate form the lateral nail fold Bactroban and doxycycline prescribed  for infection Follow up with podiatry Go to the ED if you have increased redness, swelling, drainage, fever, chills, nausea, vomiting, etc...  Reviewed expectations re: course of current medical issues. Questions answered. Outlined signs and symptoms indicating need for more acute intervention. Patient verbalized understanding. After Visit Summary given.  Rennis Harding, PA-C 02/22/21 1128

## 2021-02-26 ENCOUNTER — Other Ambulatory Visit: Payer: Self-pay | Admitting: Licensed Clinical Social Worker

## 2021-02-27 NOTE — Patient Instructions (Signed)
Visit Information  Ms. Stifter was given information about Medicaid Managed Care team care coordination services as a part of their [facility] Medicaid benefit. Corbin Ade Wauters verbally consented to engagement with the [facility] Managed Care team.   If you are experiencing a medical emergency, please call 911 or report to your local emergency department or urgent care.   If you have a non-emergency medical problem during routine business hours, please contact your provider's office and ask to speak with a nurse.   For questions related to your [facility] health plan, please call: [phone] or go here:[email or web address]  If you would like to schedule transportation through your [facility] plan, please call the following number at least 2 days in advance of your appointment: [phone].  Call the [facility] Crisis Line at [phone], at any time, 24 hours a day, 7 days a week. If you are in danger or need immediate medical attention call 911.  If you would like help to quit smoking, call 1-800-QUIT-NOW (586-803-5769) OR Espaol: 1-855-Djelo-Ya (4-580-998-3382) o para ms informacin haga clic aqu or Text READY to 505-397 to register via text  Ms. Lasure - following are the goals we discussed in your visit today:   Goals Addressed             This Visit's Progress    Manage My Emotions       Timeframe:  Long-Range Goal Priority:  High Start Date:     01/31/21                        Expected End Date:  ongoing               Follow Up Date- 03/28/21   - begin personal counseling - call and visit an old friend - check out volunteer opportunities - join a support group - laugh; watch a funny movie or comedian - learn and use visualization or guided imagery - perform a random act of kindness - practice relaxation or meditation daily - start or continue a personal journal - talk about feelings with a friend, family or spiritual advisor -  practice positive thinking and self-talk    Why is this important?   When you are stressed, down or upset, your body reacts too.  For example, your blood pressure may get higher; you may have a headache or stomachache.  When your emotions get the best of you, your body's ability to fight off cold and flu gets weak.  These steps will help you manage your emotions.     Notes:         [name], BSW, MSW, [employer] Managed Medicaid LCSW [facility]  [facility] [email or web address] Phone: [phone]

## 2021-02-27 NOTE — Patient Outreach (Signed)
Medicaid Managed Care Social Work Note  02/27/2021 Name:  Andrea Burns MRN:  [id number] DOB:  11-06-1985  Andrea Burns is an 35 y.o. year old female who is a primary patient of Patient, No Pcp Per (Inactive).  The Medicaid Managed Care Coordination team was consulted for assistance with:  Mental Health Counseling and Resources  Ms. Calma was given information about Medicaid Managed Care Coordination team services today. Andrea Burns Patient agreed to services and verbal consent obtained.  Engaged with patient  for by telephone forfollow up visit in response to referral for case management and/or care coordination services.   Assessments/Interventions:  Review of past medical history, allergies, medications, health status, including review of consultants reports, laboratory and other test data, was performed as part of comprehensive evaluation and provision of chronic care management services.  SDOH: (Social Determinant of Health) assessments and interventions performed: SDOH Interventions    Flowsheet Row Most Recent Value  SDOH Interventions   Stress Interventions Provide Counseling  Depression Interventions/Treatment  Referral to Psychiatry       Advanced Directives Status:  See Care Plan for related entries.  Care Plan                 No Known Allergies  Medications Reviewed Today     Reviewed by Maryan Puls, RN (Registered Nurse) on 02/22/21 at 1102  Med List Status: <None>   Medication Order Taking? Sig Documenting Provider Last Dose Status Informant  acetaminophen (TYLENOL) 325 MG tablet [id number]  Take 2 tablets (650 mg total) by mouth every 4 (four) hours as needed (for pain scale < 4). Gita Kudo, MD  Active   albuterol (VENTOLIN HFA) 108 (90 Base) MCG/ACT inhaler [id number]  Inhale 2 puffs into the lungs every 4 (four) hours as needed for wheezing or shortness of breath. Mickie Bail, NP  Active   cyclobenzaprine (FLEXERIL) 10 MG tablet  [id number]  Take 1 tablet (10 mg total) by mouth every 8 (eight) hours as needed for muscle spasms. Reva Bores, MD  Active   docusate sodium (COLACE) 100 MG capsule [id number]  Take 1 capsule (100 mg total) by mouth 2 (two) times daily as needed. Constant, Peggy, MD  Active   docusate sodium (COLACE) 100 MG capsule [id number]  Take 1 capsule (100 mg total) by mouth 2 (two) times daily. Gita Kudo, MD  Active   ferrous sulfate (FERROUSUL) 325 (65 FE) MG tablet [id number]  Take 1 tablet (325 mg total) by mouth 2 (two) times daily.  Patient not taking: Reported on 01/22/2021   Constant, Peggy, MD  Active   furosemide (LASIX) 20 MG tablet [id number]  TAKE 1 TABLET(20 MG) BY MOUTH TWICE DAILY FOR 3 DAYS Reva Bores, MD  Active   ibuprofen (ADVIL) 600 MG tablet [id number]  Take 1 tablet (600 mg total) by mouth every 6 (six) hours as needed. Reva Bores, MD  Active   NIFEdipine (ADALAT CC) 60 MG 24 hr tablet [id number]  Take 1 tablet (60 mg total) by mouth daily. Alric Seton, MD  Active   oxyCODONE (OXY IR/ROXICODONE) 5 MG immediate release tablet [id number]  Take 1 tablet (5 mg total) by mouth every 4 (four) hours as needed for severe pain or breakthrough pain.  Patient not taking: Reported on 01/22/2021   Gita Kudo, MD  Active   Prenatal Vit-Fe Fumarate-FA (PRENATAL MULTIVITAMIN) TABS tablet [id number]  Take 1 tablet by mouth daily at 12 noon.  [provider]  Active             Patient Active Problem List   Diagnosis Date Noted   Encounter for postpartum visit 01/22/2021   Postpartum hypertension 01/22/2021   Gallstones without obstruction of gallbladder 01/22/2021   Vaginal delivery 12/11/2020   Status post bilateral salpingectomy 12/11/2020   Chronic migraine w/o aura w/o status migrainosus, not intractable 10/06/2020   History of substance abuse (HCC) 05/23/2020   Supervision of high risk pregnancy, antepartum 05/19/2020   Chronic hypertension affecting  pregnancy 05/17/2020   Depression affecting pregnancy 08/12/2013    Conditions to be addressed/monitored per PCP order:  Anxiety and Depression  Care Plan : Social work plan of care  Updates made by Gustavus Bryant, LCSW since 02/27/2021 12:00 AM     Problem: Depression Identification (Depression)      Long-Range Goal: Depressive Symptoms Identified   Start Date: 01/31/2021  Priority: High  Note:   Timeframe:  Long-Range Goal Priority:  High Start Date:     01/31/21                        Expected End Date:  ongoing               Follow Up Date 03/28/21   Current barriers:   Chronic Mental Health needs related to Postpartum depression Limited social support and Mental Health Concerns  Needs Support, Education, and Care Coordination in order to meet unmet mental health needs. Clinical Goal(s): demonstrate a reduction in symptoms related to :Dementia  and connect with provider for ongoing mental health treatment.    explore community resource options for unmet needs related XI:PJAS: Social Isolation  patient will work with SW to address concerns related to finding a Theatre manager that is bilingual    Clinical Interventions:  Patient is a 35 yo female that is experiencing postpartum depression with limited support is looking for a long term Runner, broadcasting/film/video. [facility] LCSW contacted [facility] and was informed that they do not have a Runner, broadcasting/film/video available but can have interpreters. Patient would prefer a spanish speaking counselor and is agreeable to [facility] LCSW placed a referral through [facility]. Referral made on 01/31/21.    Update 02/08/21- [facility] has attempted to reach patient several times. Patient was advised to contact their customer service line today as they have matched her with a provider. Patient agreeable to do so today.02/27/21-Patient's chart states that patient was matched with a provider at [facility] and she can call them at [phone] to schedule an  appointment at her convenience. Should she wish to be rematched or have any questions, Tamecia can call [facility] at [phone]. Patient was encouraged to contact her matched provider.  Inter-disciplinary care team collaboration (see longitudinal plan of care) Assessed patient's previous and current treatment, coping skills, support system and barriers to care  LCSW discussed coping skills for depression. SW used empathetic and active and reflective listening, validated patient's feelings/concerns, and provided emotional support. LCSW provided self-care education as well.  Review various resources, discussed options and provided patient information about Options for mental health treatment based on need and insurance Depression screen reviewed , PHQ2/ PHQ9 completed, Solution-Focused Strategies, Mindfulness or Relaxation Training, Active listening / Reflection utilized , Emotional Supportive Provided, Quality of sleep assessed & Sleep Hygiene techniques promoted , Participation in counseling encouraged , Increase in actives / exercise encouraged , Crisis Resource Education / information provided , Suicidal  Ideation/Homicidal Ideation assessed:, Discussed Health Care Power of Attorney , and Discussed referral to [facility] to assist with connecting to mental health provider ; Patient Goals/Self-Care Activities: Over the next 120 days - participation in mental health treatment encouraged - self-awareness of emotional triggers encouraged - strategies to manage emotional triggers promoted - suicide risk screen reviewed - avoid negative self-talk - develop a personal safety plan - develop a plan to deal with triggers like holidays, anniversaries - exercise at least 2 to 3 times per week - have a plan for how to handle bad days - journal feelings and what helps to feel better or worse - spend time or talk with others at least 2 to 3 times per week - spend time or talk with others every day - watch  for early signs of feeling worse - write in journal every day - begin personal counseling - call and visit an old friend - check out volunteer opportunities - join a support group - laugh; watch a funny movie or comedian - learn and use visualization or guided imagery - perform a random act of kindness - practice relaxation or meditation daily - start or continue a personal journal - talk about feelings with a friend, family or spiritual advisor - practice positive thinking and self-talk I have placed a referral with [facility] to assist with connecting you with a mental health provider. they will contact you once a provider is located.  Depression screen [facility] 2/9 01/31/2021 11/15/2020 10/03/2020 05/19/2020 08/12/2013  Decreased Interest 2 2 3 3 3   Down, Depressed, Hopeless 3 3 1 1 3   PHQ - 2 Score 5 5 4 4 6   Altered sleeping 3 3 1 2 1   Tired, decreased energy 2 2 3 1 1   Change in appetite 1 2 2 1 3   Feeling bad or failure about yourself  2 2 1 2 1   Trouble concentrating 2 2 2 2 1   Moving slowly or fidgety/restless 0 3 2 0 1  Suicidal thoughts 0 0 0 0 1  PHQ-9 Score 15 19 15 12 15   Difficult doing work/chores Very difficult - Somewhat difficult Somewhat difficult -       Follow up:  Patient agrees to Care Plan and Follow-up.  Plan: The Managed Medicaid care management team will reach out to the patient again over the next 30 days.  Date of next scheduled Social Work care management/care coordination outreach:  03/28/21  , BSW, MSW, LCSW Managed Medicaid LCSW [facility]  [facility] [email or web address] Phone: [phone]

## 2021-02-28 ENCOUNTER — Other Ambulatory Visit: Payer: Self-pay

## 2021-02-28 ENCOUNTER — Ambulatory Visit (INDEPENDENT_AMBULATORY_CARE_PROVIDER_SITE_OTHER): Payer: Medicaid Other | Admitting: Podiatry

## 2021-02-28 DIAGNOSIS — L600 Ingrowing nail: Secondary | ICD-10-CM | POA: Diagnosis not present

## 2021-02-28 MED ORDER — DOXYCYCLINE HYCLATE 100 MG PO TABS: 100.0000 mg | ORAL_TABLET | Freq: Two times a day (BID) | ORAL | 0 refills | Status: AC

## 2021-02-28 MED ORDER — GENTAMICIN SULFATE 0.1 % EX CREA: 1.0000 "application " | TOPICAL_CREAM | Freq: Two times a day (BID) | CUTANEOUS | 0 refills | Status: DC

## 2021-03-02 NOTE — Progress Notes (Signed)
   Subjective: Patient presents today for evaluation of pain to the medial and lateral border of the bilateral great toes. Patient is concerned for possible ingrown nail.  It is very sensitive to touch.  Patient presents today for further treatment and evaluation.  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  Medial and lateral border bilateral great toes appears to be erythematous with evidence of an ingrowing nail. Pain on palpation noted to the border of the nail fold. The remaining nails appear unremarkable at this time. There are no open sores, lesions.  Vascular: Dorsalis Pedis artery and Posterior Tibial artery pedal pulses palpable. No lower extremity edema noted.   Neruologic: Grossly intact via light touch bilateral.  Musculoskeletal: Muscular strength within normal limits in all groups bilateral. Normal range of motion noted to all pedal and ankle joints.   Assesement: #1 Paronychia with ingrowing nail medial and lateral border bilateral great toes #2 Pain in toe  Plan of Care:  1. Patient evaluated.  2. Discussed treatment alternatives and plan of care. Explained nail avulsion procedure and post procedure course to patient. 3. Patient opted for permanent partial nail avulsion of the ingrown portion of the nail.  4. Prior to procedure, local anesthesia infiltration utilized using 3 ml of a 50:50 mixture of 2% plain lidocaine and 0.5% plain marcaine in a normal hallux block fashion and a betadine prep performed.  5. Partial permanent nail avulsion  with chemical matrixectomy performed using 3x30sec applications of phenol followed by alcohol flush.  6. Light dressing applied.  Post care instructions provided 7.  Prescription for gentamicin 2% cream  8.  Prescription for doxycycline 100 mg 2 times daily #20  9.  Return to clinic 2 weeks.  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 Felecia Shelling, DPM    [address] [place], [place] [address]                Office [phone]  Fax [phone]

## 2021-03-08 ENCOUNTER — Ambulatory Visit: Payer: Medicaid Other | Admitting: Cardiology

## 2021-03-10 DIAGNOSIS — Z419 Encounter for procedure for purposes other than remedying health state, unspecified: Secondary | ICD-10-CM | POA: Diagnosis not present

## 2021-03-14 ENCOUNTER — Ambulatory Visit (INDEPENDENT_AMBULATORY_CARE_PROVIDER_SITE_OTHER): Payer: Medicaid Other | Admitting: Podiatry

## 2021-03-14 ENCOUNTER — Other Ambulatory Visit: Payer: Self-pay

## 2021-03-14 DIAGNOSIS — L600 Ingrowing nail: Secondary | ICD-10-CM

## 2021-03-20 NOTE — Progress Notes (Signed)
   Subjective: 35 y.o. female presents today status post permanent nail avulsion procedure of the medial and lateral border of the bilateral great toes that was performed on 02/28/2021.  Patient states that overall she is doing very well.  The pain is improved significantly and she still has some slight tenderness to the left great toe.  She has been soaking her foot and applying antibiotic cream as instructed.  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 Nail and respective nail fold appears to be healing appropriately. Open wound to the associated nail fold with a granular wound base and moderate amount of fibrotic tissue. Minimal drainage noted. Mild erythema around the periungual region likely due to phenol chemical matricectomy.  Assessment: #1 s/p partial permanent nail matrixectomy medial lateral border bilateral great toes   Plan of care: #1 patient was evaluated  #2 light debridement of open wound was performed to the periungual border of the respective toe using a currette. Antibiotic ointment and Band-Aid was applied. #3 patient is to return to clinic on a PRN basis.  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 Felecia Shelling, DPM    [address] [place], [place] [address]                Office [phone]  Fax [phone]

## 2021-03-28 ENCOUNTER — Other Ambulatory Visit: Payer: Self-pay | Admitting: Licensed Clinical Social Worker

## 2021-03-28 NOTE — Patient Instructions (Signed)
Visit Information  Andrea Burns was given information about Medicaid Managed Care team care coordination services as a part of their [facility] Medicaid benefit. Andrea Burns verbally consented to engagement with the [facility] Managed Care team.   If you are experiencing a medical emergency, please call 911 or report to your local emergency department or urgent care.   If you have a non-emergency medical problem during routine business hours, please contact your provider's office and ask to speak with a nurse.   For questions related to your [facility] health plan, please call: [phone] or go here:[email or web address]  If you would like to schedule transportation through your [facility] plan, please call the following number at least 2 days in advance of your appointment: [phone].  Call the [facility] Crisis Line at [phone], at any time, 24 hours a day, 7 days a week. If you are in danger or need immediate medical attention call 911.  If you would like help to quit smoking, call 1-800-QUIT-NOW (2037691826) OR Espaol: 1-855-Djelo-Ya (7-078-675-4492) o para ms informacin haga clic aqu or Text READY to 010-071 to register via text  Andrea Burns - following are the goals we discussed in your visit today:   Goals Addressed             This Visit's Progress    Manage My Emotions       Timeframe:  Long-Range Goal Priority:  High Start Date:     01/31/21                        Expected End Date:  ongoing               Follow Up Date- 04/11/21   - begin personal counseling - call and visit an old friend - check out volunteer opportunities - join a support group - laugh; watch a funny movie or comedian - learn and use visualization or guided imagery - perform a random act of kindness - practice relaxation or meditation daily - start or continue a personal journal - talk about feelings with a friend, family or spiritual advisor -  practice positive thinking and self-talk    Why is this important?   When you are stressed, down or upset, your body reacts too.  For example, your blood pressure may get higher; you may have a headache or stomachache.  When your emotions get the best of you, your body's ability to fight off cold and flu gets weak.  These steps will help you manage your emotions.     Notes:         Andrea Burns, Andrea Burns, Andrea Burns, Andrea Burns  [facility] [email or web address] Phone: [phone]

## 2021-03-28 NOTE — Patient Outreach (Signed)
Medicaid Managed Care Social Work Note  03/28/2021 Name:  Andrea Burns MRN:  [id number] DOB:  January 18, 1986  Andrea Burns is an 35 y.o. year old female who is a primary patient of Patient, No Pcp Per (Inactive).  The Medicaid Managed Care Coordination team was consulted for assistance with:  Mental Health Counseling and Resources  Ms. Flannigan was given information about Medicaid Managed Care Coordination team services today. Andrea Burns Patient agreed to services and verbal consent obtained.  Engaged with patient  for by telephone forfollow up visit in response to referral for case management and/or care coordination services.   Assessments/Interventions:  Review of past medical history, allergies, medications, health status, including review of consultants reports, laboratory and other test data, was performed as part of comprehensive evaluation and provision of chronic care management services.  SDOH: (Social Determinant of Health) assessments and interventions performed: SDOH Interventions    Flowsheet Row Most Recent Value  SDOH Interventions   Stress Interventions Provide Counseling       Advanced Directives Status:  See Care Plan for related entries.  Care Plan                 No Known Allergies  Medications Reviewed Today     Reviewed by Francesca Oman, CMA (Certified Medical Assistant) on 02/28/21 at 1041  Med List Status: <None>   Medication Order Taking? Sig Documenting Provider Last Dose Status Informant  acetaminophen (TYLENOL) 325 MG tablet [id number] No Take 2 tablets (650 mg total) by mouth every 4 (four) hours as needed (for pain scale < 4). Gita Kudo, MD Taking Active   albuterol (VENTOLIN HFA) 108 (90 Base) MCG/ACT inhaler [id number] No Inhale 2 puffs into the lungs every 4 (four) hours as needed for wheezing or shortness of breath. Mickie Bail, NP Taking Active   cyclobenzaprine (FLEXERIL) 10 MG tablet [id number] No Take 1 tablet (10  mg total) by mouth every 8 (eight) hours as needed for muscle spasms. Reva Bores, MD Taking Active   docusate sodium (COLACE) 100 MG capsule [id number] No Take 1 capsule (100 mg total) by mouth 2 (two) times daily as needed. Constant, Peggy, MD Taking Active   docusate sodium (COLACE) 100 MG capsule [id number]  Take 1 capsule (100 mg total) by mouth 2 (two) times daily. Gita Kudo, MD  Active   doxycycline (VIBRAMYCIN) 100 MG capsule [id number]  Take 1 capsule (100 mg total) by mouth 2 (two) times daily. Wurst, [place], PA-C  Active   ferrous sulfate (FERROUSUL) 325 (65 FE) MG tablet [id number] No Take 1 tablet (325 mg total) by mouth 2 (two) times daily.  Patient not taking: Reported on 01/22/2021   Constant, Peggy, MD Not Taking Active   furosemide (LASIX) 20 MG tablet [id number]  TAKE 1 TABLET(20 MG) BY MOUTH TWICE DAILY FOR 3 DAYS Reva Bores, MD  Active   ibuprofen (ADVIL) 600 MG tablet [id number] No Take 1 tablet (600 mg total) by mouth every 6 (six) hours as needed. Reva Bores, MD Taking Active   mupirocin ointment (BACTROBAN) 2 % [id number]  Apply 1 application topically 2 (two) times daily. Wurst, [place], PA-C  Active   NIFEdipine (ADALAT CC) 60 MG 24 hr tablet [id number] No Take 1 tablet (60 mg total) by mouth daily. Alric Seton, MD Taking Active   oxyCODONE (OXY IR/ROXICODONE) 5 MG immediate release tablet [id number] No Take 1 tablet (5 mg total) by mouth every 4 (four) hours as  needed for severe pain or breakthrough pain.  Patient not taking: Reported on 01/22/2021   Gita Kudo, MD Not Taking Active   Prenatal Vit-Fe Fumarate-FA (PRENATAL MULTIVITAMIN) TABS tablet [id number] No Take 1 tablet by mouth daily at 12 noon. [provider] Taking Active             Patient Active Problem List   Diagnosis Date Noted   Encounter for postpartum visit 01/22/2021   Postpartum hypertension 01/22/2021   Gallstones without obstruction of gallbladder 01/22/2021    Vaginal delivery 12/11/2020   Status post bilateral salpingectomy 12/11/2020   Chronic migraine w/o aura w/o status migrainosus, not intractable 10/06/2020   History of substance abuse (HCC) 05/23/2020   Supervision of high risk pregnancy, antepartum 05/19/2020   Chronic hypertension affecting pregnancy 05/17/2020   Depression affecting pregnancy 08/12/2013    Conditions to be addressed/monitored per PCP order:  Anxiety and Depression  Care Plan : Social work plan of care  Updates made by Gustavus Bryant, LCSW since 03/28/2021 12:00 AM     Problem: Depression Identification (Depression)      Long-Range Goal: Depressive Symptoms Identified   Start Date: 01/31/2021  Priority: High  Note:   Timeframe:  Long-Range Goal Priority:  High Start Date:     01/31/21                        Expected End Date:  ongoing               Follow Up Date 04/11/21  Current barriers:   Chronic Mental Health needs related to Postpartum depression Limited social support and Mental Health Concerns  Needs Support, Education, and Care Coordination in order to meet unmet mental health needs. Clinical Goal(s): demonstrate a reduction in symptoms related to :Dementia  and connect with provider for ongoing mental health treatment.    explore community resource options for unmet needs related YQ:MVHQ: Social Isolation  patient will work with SW to address concerns related to finding a Theatre manager that is bilingual    Clinical Interventions:  Patient is a 35 yo female that is experiencing postpartum depression with limited support is looking for a long term Runner, broadcasting/film/video. [facility] LCSW contacted [facility] and was informed that they do not have a Runner, broadcasting/film/video available but can have interpreters. Patient would prefer a spanish speaking counselor and is agreeable to [facility] LCSW placed a referral through [facility]. Referral made on 01/31/21.    Update 02/08/21- [facility] has attempted to reach patient several  times. Patient was advised to contact their customer service line today as they have matched her with a provider. Patient agreeable to do so today.02/27/21-Patient's chart states that patient was matched with a provider at [facility] and she can call them at [phone] to schedule an appointment at her convenience. Should she wish to be rematched or have any questions, Jheri can call [facility] at [phone]. Patient was encouraged to contact her matched provider. Update on 03/28/21- Patient has missed [facility]'s call and text message. Patient was encouraged to contact them today to schedule her appointment.  Inter-disciplinary care team collaboration (see longitudinal plan of care) Assessed patient's previous and current treatment, coping skills, support system and barriers to care  LCSW discussed coping skills for depression. SW used empathetic and active and reflective listening, validated patient's feelings/concerns, and provided emotional support. LCSW provided self-care education as well.  Review various resources, discussed options and provided patient  information about Options for mental health treatment based on need and insurance Depression screen reviewed , PHQ2/ PHQ9 completed, Solution-Focused Strategies, Mindfulness or Relaxation Training, Active listening / Reflection utilized , Emotional Supportive Provided, Quality of sleep assessed & Sleep Hygiene techniques promoted , Participation in counseling encouraged , Increase in actives / exercise encouraged , Crisis Resource Education / information provided , Suicidal Ideation/Homicidal Ideation assessed:, Discussed Health Care Power of Attorney , and Discussed referral to [facility] to assist with connecting to mental health provider ; Patient Goals/Self-Care Activities: Over the next 120 days - participation in mental health treatment encouraged - self-awareness of emotional triggers encouraged - strategies to manage emotional  triggers promoted - suicide risk screen reviewed - avoid negative self-talk - develop a personal safety plan - develop a plan to deal with triggers like holidays, anniversaries - exercise at least 2 to 3 times per week - have a plan for how to handle bad days - journal feelings and what helps to feel better or worse - spend time or talk with others at least 2 to 3 times per week - spend time or talk with others every day - watch for early signs of feeling worse - write in journal every day - begin personal counseling - call and visit an old friend - check out volunteer opportunities - join a support group - laugh; watch a funny movie or comedian - learn and use visualization or guided imagery - perform a random act of kindness - practice relaxation or meditation daily - start or continue a personal journal - talk about feelings with a friend, family or spiritual advisor - practice positive thinking and self-talk I have placed a referral with [facility] to assist with connecting you with a mental health provider. they will contact you once a provider is located.  Depression screen [facility] 2/9 01/31/2021 11/15/2020 10/03/2020 05/19/2020 08/12/2013  Decreased Interest 2 2 3 3 3   Down, Depressed, Hopeless 3 3 1 1 3   PHQ - 2 Score 5 5 4 4 6   Altered sleeping 3 3 1 2 1   Tired, decreased energy 2 2 3 1 1   Change in appetite 1 2 2 1 3   Feeling bad or failure about yourself  2 2 1 2 1   Trouble concentrating 2 2 2 2 1   Moving slowly or fidgety/restless 0 3 2 0 1  Suicidal thoughts 0 0 0 0 1  PHQ-9 Score 15 19 15 12 15   Difficult doing work/chores Very difficult - Somewhat difficult Somewhat difficult -       Follow up:  Patient agrees to Care Plan and Follow-up.  Plan: The Managed Medicaid care management team will reach out to the patient again over the next 30 days.  Date of next scheduled Social Work care management/care coordination outreach:  04/11/21  , BSW, MSW,  LCSW Managed Medicaid LCSW [facility]  [facility] [email or web address] Phone: [phone]

## 2021-04-04 ENCOUNTER — Ambulatory Visit (INDEPENDENT_AMBULATORY_CARE_PROVIDER_SITE_OTHER): Payer: Medicaid Other | Admitting: Obstetrics & Gynecology

## 2021-04-04 ENCOUNTER — Encounter: Payer: Self-pay | Admitting: Obstetrics & Gynecology

## 2021-04-04 ENCOUNTER — Other Ambulatory Visit: Payer: Self-pay

## 2021-04-04 ENCOUNTER — Other Ambulatory Visit (HOSPITAL_COMMUNITY)
Admission: RE | Admit: 2021-04-04 | Discharge: 2021-04-04 | Disposition: A | Discharge status: Home / Self Care | Payer: Medicaid Other | Source: Ambulatory Visit | Attending: Obstetrics & Gynecology | Admitting: Obstetrics & Gynecology

## 2021-04-04 VITALS — BP 161/103 | HR 93 | Ht 62.0 in | Wt 188.0 lb

## 2021-04-04 DIAGNOSIS — R300 Dysuria: Secondary | ICD-10-CM

## 2021-04-04 DIAGNOSIS — N930 Postcoital and contact bleeding: Secondary | ICD-10-CM | POA: Insufficient documentation

## 2021-04-04 LAB — POCT URINALYSIS DIPSTICK
Bilirubin, UA: NEGATIVE
Blood, UA: NEGATIVE
Glucose, UA: NEGATIVE
Ketones, UA: NEGATIVE
Leukocytes, UA: NEGATIVE
Nitrite, UA: NEGATIVE
Protein, UA: POSITIVE — AB
Spec Grav, UA: 1.015 (ref 1.010–1.025)
Urobilinogen, UA: 0.2 E.U./dL
pH, UA: 6.5 (ref 5.0–8.0)

## 2021-04-04 NOTE — Addendum Note (Signed)
Addended by: Charlsie Quest B on: 04/04/2021 02:18 PM   Modules accepted: Orders

## 2021-04-04 NOTE — Progress Notes (Signed)
Pt is here due to bleeding with intercourse. LMP: 03/09/21. BCM: BTL. Pt is not breastfeeding. Pt BP is still elevated. Will need referral to PCP for HTN. States bleeding is only with intercourse. No unusual bleeding with cycles.

## 2021-04-04 NOTE — Progress Notes (Signed)
Patient ID: Andrea Burns, female   DOB: Apr 20, 1986, 35 y.o.   MRN: [id number]  Chief Complaint  Patient presents with   Bleeding with Intercourse    HPI Andrea Burns is a 35 y.o. female.  [id number] Patient's last menstrual period was 03/09/2021 (approximate). She is 4 months PP s/p tubal sterilization. C/O postcoital vaginal bleeding on 3 occasions, last episode was 2 weeks ago and not bleeding the last time she had sex. 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   Medical history non-contributory    Pneumonia    2013   Vaginal Pap smear, abnormal     Past Surgical History:  Procedure Laterality Date   APPENDECTOMY     COLPOSCOPY W/ BIOPSY / CURETTAGE     IUD REMOVAL     LAPAROSCOPIC APPENDECTOMY N/A 10/18/2019   Procedure: APPENDECTOMY LAPAROSCOPIC;  Surgeon: [name], MD;  Location: [facility];  Service: General;  Laterality: N/A;   LAPAROSCOPY ABDOMEN DIAGNOSTIC     Removal of migrated IUD    TUBAL LIGATION N/A 12/11/2020   Procedure: POST PARTUM TUBAL LIGATION;  Surgeon: Tereso Newcomer, MD;  Location: [facility] LD ORS;  Service: Gynecology;  Laterality: N/A;    Family History  Problem Relation Age of Onset   Diabetes Mother    Hypertension Mother    Cancer Mother    Diabetes Father    Cancer Paternal Grandmother        liver & lung    Social History Social History   Tobacco Use   Smoking status: Former    Packs/day: 0.25    Years: 1.00    Pack years: 0.25    Types: Cigarettes    Quit date: 05/03/2005    Years since quitting: 15.9   Smokeless tobacco: Never  Vaping Use   Vaping Use: Never used  Substance Use Topics   Alcohol use: Not Currently    Alcohol/week: 0.0 standard drinks   Drug use: Not Currently    Types:  Cocaine    Comment: last used August 2021    No Known Allergies  Current Outpatient Medications  Medication Sig Dispense Refill   NIFEdipine (ADALAT CC) 60 MG 24 hr tablet Take 1 tablet (60 mg total) by mouth daily. 60 tablet 0   No current facility-administered medications for this visit.    Review of Systems Review of Systems  Constitutional: Negative.   Respiratory: Negative.    Genitourinary:  Positive for pelvic pain and vaginal bleeding. Negative for vaginal discharge.  Musculoskeletal:  Positive for back pain.   Blood pressure (!) 161/103, pulse 93, height 5\' 2"  (1.575 m), weight 188 lb (85.3 kg), last menstrual period 03/09/2021, not currently breastfeeding.  Physical Exam Physical Exam Vitals and nursing note reviewed. Exam conducted with a chaperone present.  Constitutional:      Appearance: Normal appearance.  Pulmonary:     Effort: Pulmonary effort is normal.  Genitourinary:    General: Normal vulva.     Exam position: Lithotomy position.     Vagina: Normal.     Cervix: Normal.     Uterus: Normal.      Adnexa: Right adnexa normal.  Right: No mass.         Left: Tenderness (mild) present. No mass.    Neurological:     Mental Status: She is alert.  Psychiatric:        Mood and Affect: Mood normal.        Behavior: Behavior normal.    Data Reviewed Pap 2021 negative  Assessment Pelvic pain and abnormal bleeding  Plan Orders Placed This Encounter  Procedures   Urine Culture   US PELVIC COMPLETE WITH TRANSVAGINAL    Standing Status:   Future    Standing Expiration Date:   04/04/2022    Order Specific Question:   Reason for Exam (SYMPTOM  OR DIAGNOSIS REQUIRED)    Answer:   pelvic pain and abnormal bleeding    Order Specific Question:   Preferred imaging location?    Answer:   [facility]    Order Specific Question:   Release to patient    Answer:   Immediate        Scheryl Darter 04/04/2021, 1:55 PM

## 2021-04-05 LAB — CERVICOVAGINAL ANCILLARY ONLY
Bacterial Vaginitis (gardnerella): POSITIVE — AB
Candida Glabrata: NEGATIVE
Candida Vaginitis: NEGATIVE
Chlamydia: NEGATIVE
Comment: NEGATIVE
Comment: NEGATIVE
Comment: NEGATIVE
Comment: NEGATIVE
Comment: NEGATIVE
Comment: NORMAL
Neisseria Gonorrhea: NEGATIVE
Trichomonas: NEGATIVE

## 2021-04-09 LAB — URINE CULTURE

## 2021-04-10 DIAGNOSIS — Z419 Encounter for procedure for purposes other than remedying health state, unspecified: Secondary | ICD-10-CM | POA: Diagnosis not present

## 2021-04-11 ENCOUNTER — Ambulatory Visit (HOSPITAL_COMMUNITY)
Admission: RE | Admit: 2021-04-11 | Discharge: 2021-04-11 | Disposition: A | Discharge status: Home / Self Care | Payer: Medicaid Other | Source: Ambulatory Visit | Attending: Obstetrics & Gynecology | Admitting: Obstetrics & Gynecology

## 2021-04-11 ENCOUNTER — Telehealth: Payer: Self-pay

## 2021-04-11 ENCOUNTER — Other Ambulatory Visit: Payer: Self-pay

## 2021-04-11 DIAGNOSIS — N930 Postcoital and contact bleeding: Secondary | ICD-10-CM | POA: Insufficient documentation

## 2021-04-11 NOTE — Patient Instructions (Signed)
Andrea Burns ,   The [facility] Managed Care Team is available to provide assistance to you with your healthcare needs at no cost and as a benefit of your [facility] Health plan. I'm sorry I was unable to reach you today for our scheduled appointment. Our care guide will call you to reschedule our telephone appointment. Please call me at the number below. I am available to be of assistance to you regarding your healthcare needs. .   Thank you,   [name], BSW, MSW, LCSW Managed Medicaid LCSW [name]  [address] [name] Phone: [phone]

## 2021-04-11 NOTE — Patient Outreach (Signed)
[facility]) Care Management  04/11/2021  TOCARRA GASSEN [birth year] [id number]   LCSW completed [facility] outreach attempt today but was unable to reach patient successfully. A HIPPA compliant voice message was left encouraging patient to return call once available. LCSW will ask Scheduling Care Guide to reschedule [facility] SW appointment with patient as well.  [name], BSW, MSW, [employer] Managed Medicaid LCSW [facility]  [facility] [email or web address] Phone: [phone]

## 2021-04-11 NOTE — Telephone Encounter (Signed)
..   Medicaid Managed Care   Unsuccessful Outreach Note  04/11/2021 Name: Andrea Burns MRN: [id number] DOB: [birth year]  Referred by: Patient, No Pcp Per (Inactive) Reason for referral : High Risk Managed Medicaid (Called pt today to get her phone visit with the [facility] LCSW rescheduled.)   An unsuccessful telephone outreach was attempted today. The patient was referred to the case management team for assistance with care management and care coordination.   Follow Up Plan: The care management team will reach out to the patient again over the next 7 days.   Weston Settle Care Guide, High Risk Medicaid Managed Care Embedded Care Coordination [facility]  [facility]    .j

## 2021-04-16 ENCOUNTER — Other Ambulatory Visit: Payer: Self-pay | Admitting: Obstetrics & Gynecology

## 2021-04-16 DIAGNOSIS — N930 Postcoital and contact bleeding: Secondary | ICD-10-CM

## 2021-04-16 MED ORDER — METRONIDAZOLE 500 MG PO TABS: 500.0000 mg | ORAL_TABLET | Freq: Two times a day (BID) | ORAL | 0 refills | Status: DC

## 2021-04-16 NOTE — Progress Notes (Signed)
Meds ordered this encounter  Medications  . metroNIDAZOLE (FLAGYL) 500 MG tablet    Sig: Take 1 tablet (500 mg total) by mouth 2 (two) times daily.    Dispense:  14 tablet    Refill:  0    

## 2021-05-10 DIAGNOSIS — Z419 Encounter for procedure for purposes other than remedying health state, unspecified: Secondary | ICD-10-CM | POA: Diagnosis not present

## 2021-05-11 ENCOUNTER — Telehealth: Payer: Self-pay

## 2021-05-11 NOTE — Telephone Encounter (Signed)
..  Patient declines further follow up and engagement by the Managed Medicaid Team. Appropriate care team members and provider have been notified via electronic communication. The Managed Medicaid Team is available to follow up with the patient after provider conversation with the patient regarding recommendation for engagement and subsequent re-referral to the Managed Medicaid Team.    Jennifer Alley Care Guide, High Risk Medicaid Managed Care Embedded Care Coordination [place]  [facility]   

## 2021-06-02 ENCOUNTER — Other Ambulatory Visit: Payer: Self-pay | Admitting: Cardiology

## 2021-06-06 ENCOUNTER — Other Ambulatory Visit: Payer: Self-pay | Admitting: Obstetrics

## 2021-06-06 DIAGNOSIS — I10 Essential (primary) hypertension: Secondary | ICD-10-CM

## 2021-06-10 DIAGNOSIS — Z419 Encounter for procedure for purposes other than remedying health state, unspecified: Secondary | ICD-10-CM | POA: Diagnosis not present

## 2021-07-11 DIAGNOSIS — Z419 Encounter for procedure for purposes other than remedying health state, unspecified: Secondary | ICD-10-CM | POA: Diagnosis not present

## 2021-07-20 IMAGING — US US FETAL BPP W/ NON-STRESS
1 series · 13 of 14 positions shown · non-contrast
Comparison: none

[Series 1: us fetal bpp w/ non-stress · 14 acquisitions, 13 frames shown]
[im 1/14]
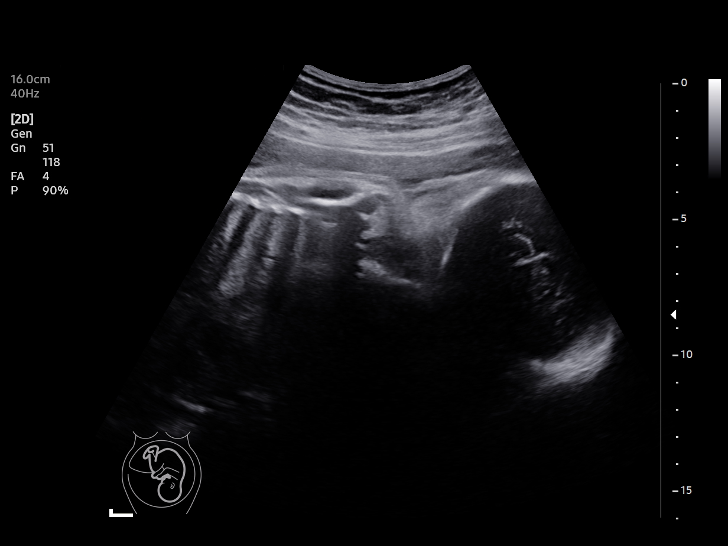
[im 2/14]
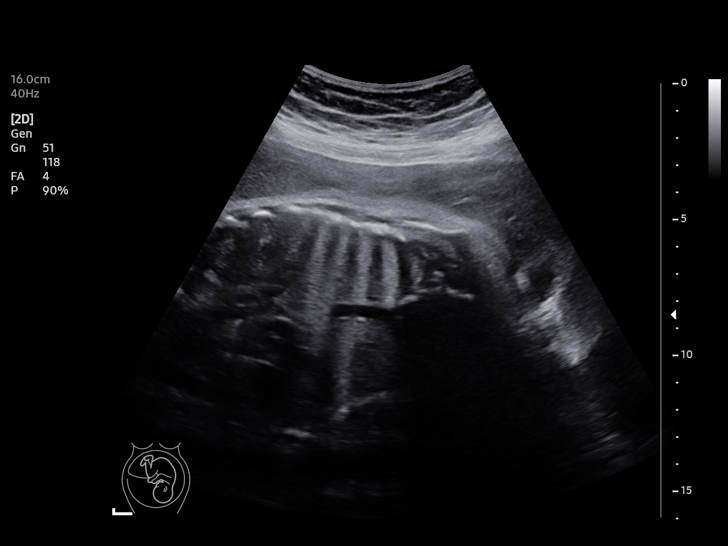
[im 3/14]
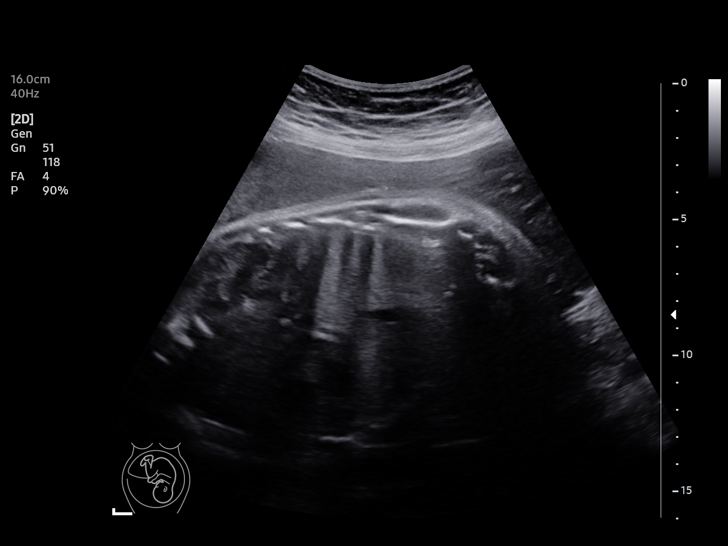
[im 4/14]
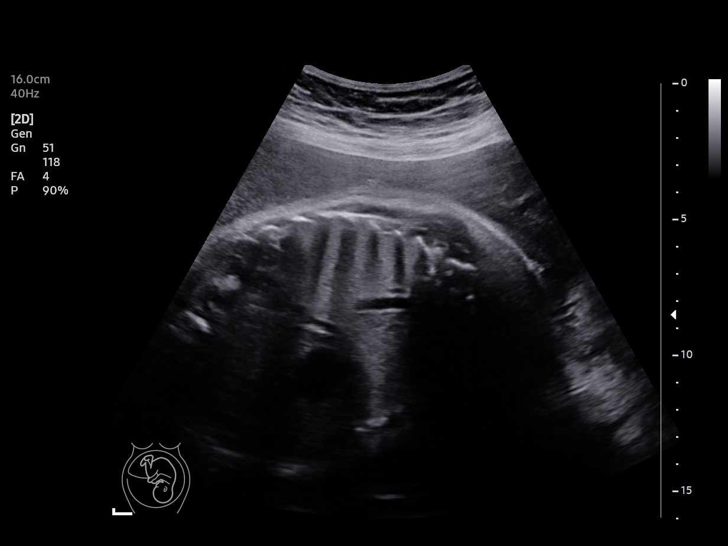
[im 5/14]
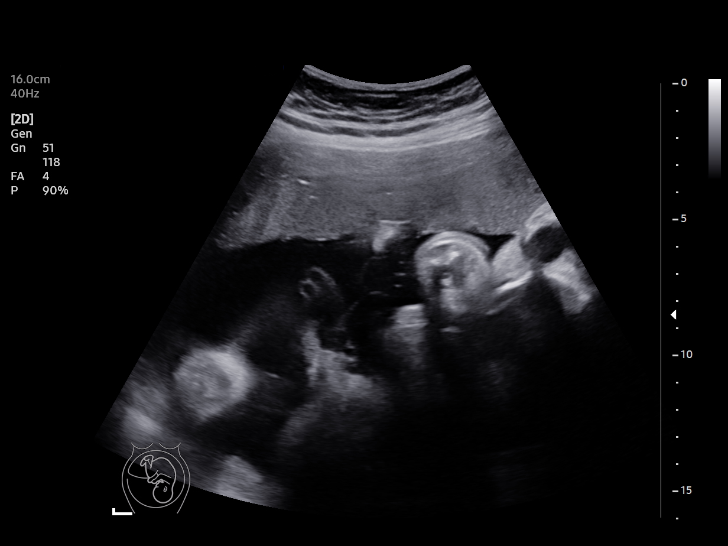
[im 6/14]
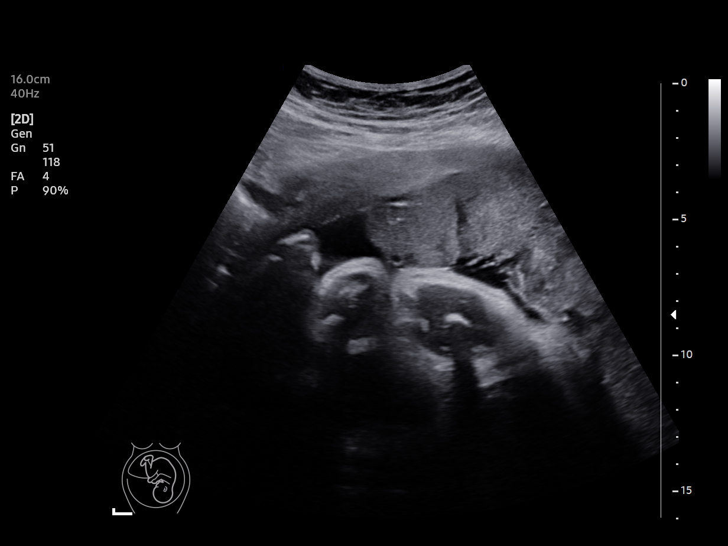
[im 8/14]
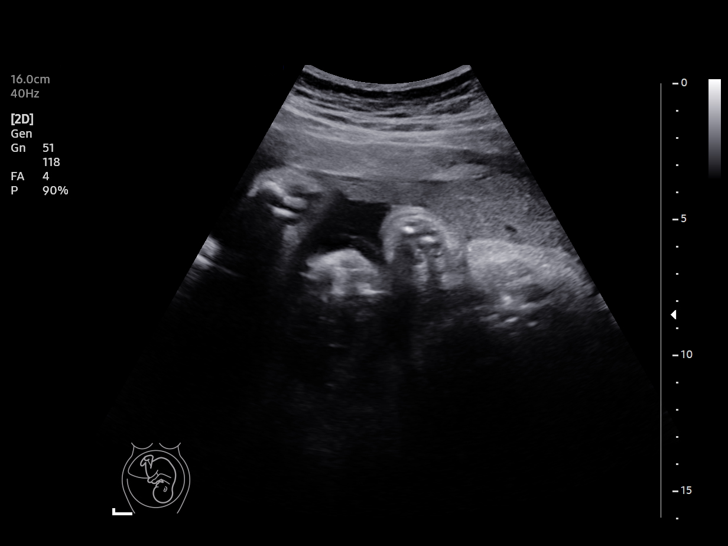
[im 9/14]
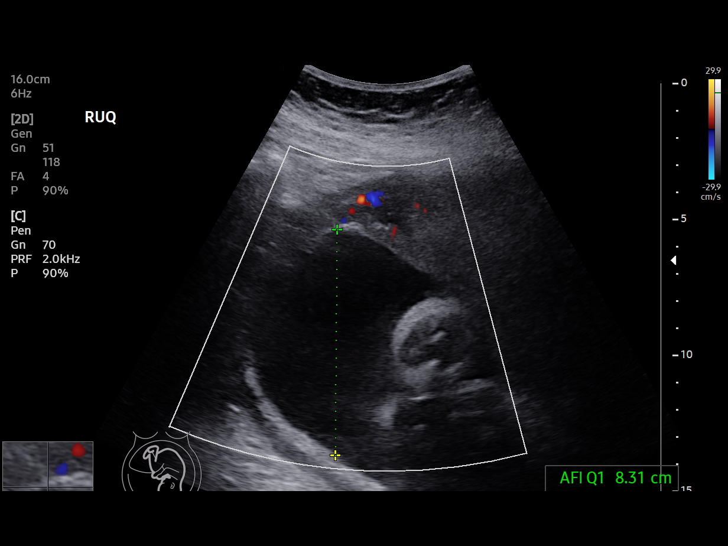
[im 10/14]
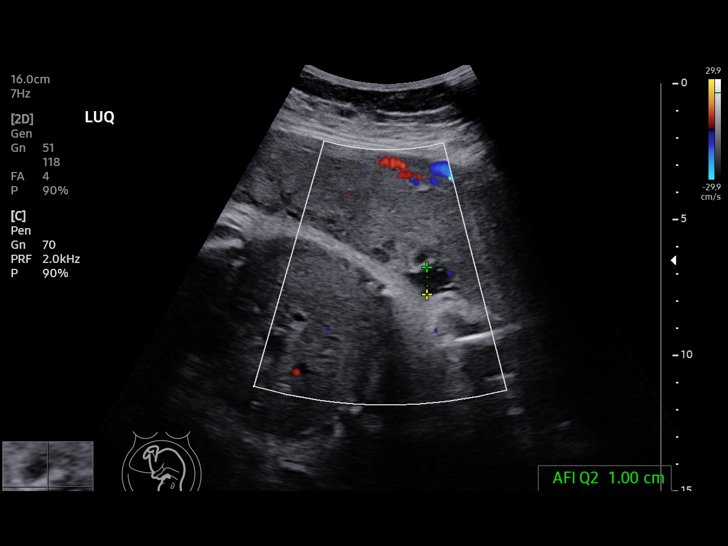
[im 11/14]
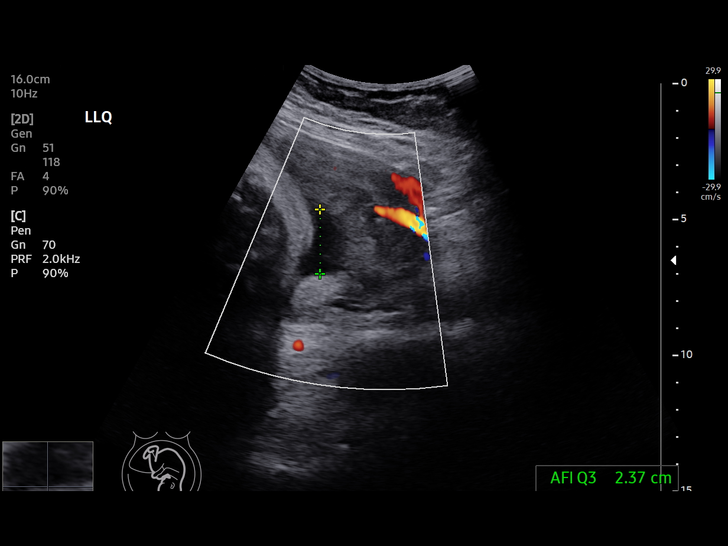
[im 12/14]
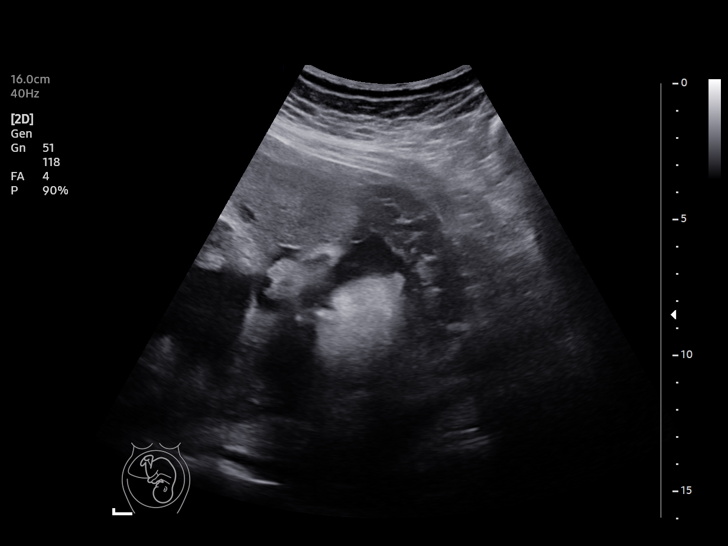
[im 13/14]
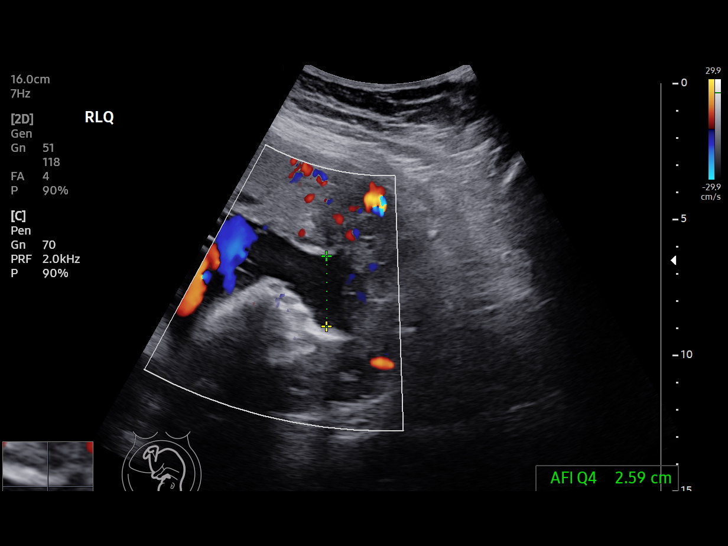
[im 14/14]
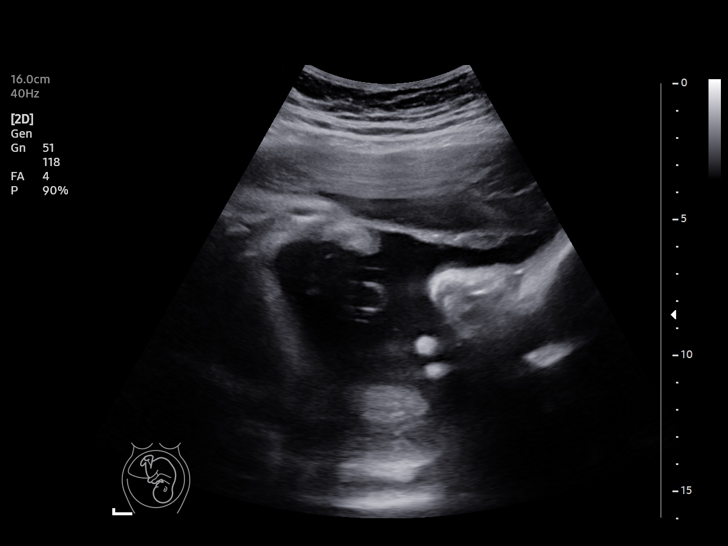

[13 of 14 positions shown; findings below may reference images not displayed]

Name:       KARDINI REEN               Visit Date: 11/15/2020 [DATE]

                                                            [REDACTED]care at

 1  US FETAL BPP W/NONSTRESS              76818.4     BENJAMIN NANA AZOR

Service(s) Provided

Indications

 34 weeks gestation of pregnancy
 Hypertension - Chronic/Pre-existing
Fetal Evaluation

 Num Of Fetuses:         1
 Preg. Location:         Intrauterine
 Cardiac Activity:       Observed
 Presentation:           Cephalic

 Amniotic Fluid
 AFI FV:      Within normal limits

 AFI Sum(cm)     %Tile       Largest Pocket(cm)
 14.27           50

 RUQ(cm)       RLQ(cm)       LUQ(cm)        LLQ(cm)
 8.31          2.59          1
Biophysical Evaluation

 Amniotic F.V:   Pocket => 2 cm             F. Tone:        Observed
 F. Movement:    Observed                   N.S.T:          Reactive
 F. Breathing:   Observed                   Score:          [DATE]
OB History

 Gravidity:    6         Term:   4        Prem:   0        SAB:   1
 TOP:          0       Ectopic:  0        Living: 4
Gestational Age

 Best:          34w 0d     Det. By:  Early Ultrasound         EDD:   12/27/20
                                     (05/17/20)
Impression

 BPP [DATE]
Recommendations

 -Continue weekly BPP till delivery.
                  K[place]uvilla, Mirna

## 2021-08-08 DIAGNOSIS — Z419 Encounter for procedure for purposes other than remedying health state, unspecified: Secondary | ICD-10-CM | POA: Diagnosis not present

## 2021-08-20 ENCOUNTER — Encounter: Payer: Self-pay | Admitting: Cardiology

## 2021-08-20 ENCOUNTER — Other Ambulatory Visit: Payer: Self-pay

## 2021-08-20 ENCOUNTER — Ambulatory Visit (INDEPENDENT_AMBULATORY_CARE_PROVIDER_SITE_OTHER): Payer: Medicaid Other | Admitting: Cardiology

## 2021-08-20 VITALS — BP 124/80 | HR 62 | Ht 62.0 in | Wt 195.2 lb

## 2021-08-20 DIAGNOSIS — O10919 Unspecified pre-existing hypertension complicating pregnancy, unspecified trimester: Secondary | ICD-10-CM | POA: Diagnosis not present

## 2021-08-20 DIAGNOSIS — M79605 Pain in left leg: Secondary | ICD-10-CM | POA: Diagnosis not present

## 2021-08-20 DIAGNOSIS — M79604 Pain in right leg: Secondary | ICD-10-CM | POA: Diagnosis not present

## 2021-08-20 DIAGNOSIS — O161 Unspecified maternal hypertension, first trimester: Secondary | ICD-10-CM | POA: Insufficient documentation

## 2021-08-20 DIAGNOSIS — M79606 Pain in leg, unspecified: Secondary | ICD-10-CM | POA: Insufficient documentation

## 2021-08-20 NOTE — Patient Instructions (Signed)
Medication Instructions:  °The current medical regimen is effective;  continue present plan and medications. ° °*If you need a refill on your cardiac medications before your next appointment, please call your pharmacy* ° °Follow-Up: °At [facility], you and your health needs are our priority.  As part of our continuing mission to provide you with exceptional heart care, we have created designated Provider Care Teams.  These Care Teams include your primary Cardiologist (physician) and Advanced Practice Providers (APPs -  Physician Assistants and Nurse Practitioners) who all work together to provide you with the care you need, when you need it. ° °We recommend signing up for the patient portal called "MyChart".  Sign up information is provided on this After Visit Summary.  MyChart is used to connect with patients for Virtual Visits (Telemedicine).  Patients are able to view lab/test results, encounter notes, upcoming appointments, etc.  Non-urgent messages can be sent to your provider as well.   °To learn more about what you can do with MyChart, go to [email or web address].   ° °Your next appointment:   °Follow up as needed. ° °Thank you for choosing [facility]!! ° ° ° °

## 2021-08-20 NOTE — Progress Notes (Signed)
ekg 

## 2021-08-20 NOTE — Assessment & Plan Note (Signed)
After delivery, no further medications.  She is doing very well.  Blood pressure under control. ?

## 2021-08-20 NOTE — Progress Notes (Signed)
?Cardiology Office Note:   ? ?Date:  08/20/2021  ? ?ID:  Andrea Burns, DOB 02-08-1986, MRN [id number] ? ?PCP:  Patient, No Pcp Per (Inactive) ?  ?[facility] Providers ?Cardiologist:  Candee Furbish, MD    ? ?Referring MD: No ref. provider found  ? ? ?History of Present Illness:   ? ?Andrea Burns is a 36 y.o. female here for the follow-up of hypertension. Off of all medicine. Since delivered child 8 months ago BP ok.  She is not taking any medications. ? ?Has been seen in our hypertension clinic.  Appreciate their assistance.  In the past she has had depression cocaine in past, acute appendicitis.  In January was seen for abdominal discomfort.   ? ?Past Medical History:  ?Diagnosis Date  ? Anemia   ? Anxiety   ? Asthma   ? Depression   ? GERD (gastroesophageal reflux disease)   ? HA (headache)   ? Hypertension   ? IUD migration   ? intraperitoneal migration requiring surgical removal  ? Medical history non-contributory   ? Pneumonia   ? 2013  ? Vaginal Pap smear, abnormal   ? ? ?Past Surgical History:  ?Procedure Laterality Date  ? APPENDECTOMY    ? COLPOSCOPY W/ BIOPSY / CURETTAGE    ? IUD REMOVAL    ? LAPAROSCOPIC APPENDECTOMY N/A 10/18/2019  ? Procedure: APPENDECTOMY LAPAROSCOPIC;  Surgeon: Virl Cagey, MD;  Location: [facility];  Service: General;  Laterality: N/A;  ? LAPAROSCOPY ABDOMEN DIAGNOSTIC    ? Removal of migrated IUD   ? TUBAL LIGATION N/A 12/11/2020  ? Procedure: POST PARTUM TUBAL LIGATION;  Surgeon: Osborne Oman, MD;  Location: [facility];  Service: Gynecology;  Laterality: N/A;  ? ? ?Current Medications: ?No outpatient medications have been marked as taking for the 08/20/21 encounter (Office Visit) with Jerline Pain, MD.  ?  ? ?Allergies:   Patient has no known allergies.  ? ?Social History  ? ?Socioeconomic History  ? Marital status: Married  ?  Spouse name: Joseluis Achille Rich  ? Number of children: 4  ? Years of education: Not on file  ? Highest education level: Not on file   ?Occupational History  ? Not on file  ?Tobacco Use  ? Smoking status: Former  ?  Packs/day: 0.25  ?  Years: 1.00  ?  Pack years: 0.25  ?  Types: Cigarettes  ?  Quit date: 05/03/2005  ?  Years since quitting: 16.3  ? Smokeless tobacco: Never  ?Vaping Use  ? Vaping Use: Never used  ?Substance and Sexual Activity  ? Alcohol use: Not Currently  ?  Alcohol/week: 0.0 standard drinks  ? Drug use: Not Currently  ?  Types: Cocaine  ?  Comment: last used August 2021  ? Sexual activity: Yes  ?  Partners: Male  ?  Birth control/protection: None  ?Other Topics Concern  ? Not on file  ?Social History Narrative  ? Not on file  ? ?Social Determinants of Health  ? ?Financial Resource Strain: Not on file  ?Food Insecurity: Food Insecurity Present  ? Worried About Charity fundraiser in the Last Year: Sometimes true  ? Ran Out of Food in the Last Year: Sometimes true  ?Transportation Needs: No Transportation Needs  ? Lack of Transportation (Medical): No  ? Lack of Transportation (Non-Medical): No  ?Physical Activity: Not on file  ?Stress: Stress Concern Present  ? Feeling of Stress : Rather much  ?Social Connections:  Not on file  ?  ? ?Family History: ?The patient's family history includes Cancer in her mother and paternal grandmother; Diabetes in her father and mother; Hypertension in her mother. ? ?ROS:   ?Please see the history of present illness.    ? All other systems reviewed and are negative. ? ?EKGs/Labs/Other Studies Reviewed:   ? ?The following studies were reviewed today: ?Echocardiogram 06/19/2020-normal EF normal valve function ? ?EKG:  EKG is  ordered today.  The ekg ordered today demonstrates sinus rhythm 62 no other abnormalities ? ?Recent Labs: ?10/06/2020: TSH 2.020 ?12/10/2020: ALT 15; BUN 7; Creatinine, Ser 0.73; Hemoglobin 11.5; Platelets 233; Potassium 3.8; Sodium 135  ?Recent Lipid Panel ?No results found for: CHOL, TRIG, HDL, CHOLHDL, VLDL, LDLCALC, LDLDIRECT ? ? ?Risk Assessment/Calculations:   ? ? ?    ? ?    ? ?Physical Exam:   ? ?VS:  BP 124/80 (BP Location: Right Arm, Patient Position: Sitting, Cuff Size: Large)   Pulse 62   Ht 5\' 2"  (1.575 m)   Wt 195 lb 3.2 oz (88.5 kg)   SpO2 98%   BMI 35.70 kg/m?    ? ?Wt Readings from Last 3 Encounters:  ?08/20/21 195 lb 3.2 oz (88.5 kg)  ?04/04/21 188 lb (85.3 kg)  ?01/22/21 183 lb 6.4 oz (83.2 kg)  ?  ? ?GEN:  Well nourished, well developed in no acute distress ?HEENT: Normal ?NECK: No JVD; No carotid bruits ?LYMPHATICS: No lymphadenopathy ?CARDIAC: RRR, no murmurs, no rubs, gallops ?RESPIRATORY:  Clear to auscultation without rales, wheezing or rhonchi  ?ABDOMEN: Soft, non-tender, non-distended ?MUSCULOSKELETAL:  No edema; No deformity excellent distal pulses ?SKIN: Warm and dry ?NEUROLOGIC:  Alert and oriented x 3 ?PSYCHIATRIC:  Normal affect  ? ?ASSESSMENT:   ? ?1. Chronic hypertension affecting pregnancy   ?2. Elevated blood pressure affecting pregnancy in first trimester, antepartum   ?3. Pain in both lower extremities   ? ?PLAN:   ? ?In order of problems listed above: ? ?Elevated blood pressure affecting pregnancy in first trimester, antepartum ?After delivery, no further medications.  She is doing very well.  Blood pressure under control. ? ?Leg pain ?She was worried about bilateral calf pain.  Overall reassuring.  Good distal pulses.  She thought she may have peripheral arterial disease.  I reassured her.  Likely musculoskeletal ?  ? ? ?  ? ? ?Medication Adjustments/Labs and Tests Ordered: ?Current medicines are reviewed at length with the patient today.  Concerns regarding medicines are outlined above.  ?Orders Placed This Encounter  ?Procedures  ? EKG 12-Lead  ? ?No orders of the defined types were placed in this encounter. ? ? ?Patient Instructions  ?Medication Instructions:  ?The current medical regimen is effective;  continue present plan and medications. ? ?*If you need a refill on your cardiac medications before your next appointment, please call your  pharmacy* ? ?Follow-Up: ?At [facility], you and your health needs are our priority.  As part of our continuing mission to provide you with exceptional heart care, we have created designated Provider Care Teams.  These Care Teams include your primary Cardiologist (physician) and Advanced Practice Providers (APPs -  Physician Assistants and Nurse Practitioners) who all work together to provide you with the care you need, when you need it. ? ?We recommend signing up for the patient portal called "MyChart".  Sign up information is provided on this After Visit Summary.  MyChart is used to connect with patients for Virtual Visits (Telemedicine).  Patients are able to view lab/test results, encounter notes, upcoming appointments, etc.  Non-urgent messages can be sent to your provider as well.   ?To learn more about what you can do with MyChart, go to [email or web address].   ? ?Your next appointment:   ?Follow up as needed. ? ?Thank you for choosing [facility]!! ? ? ? ?  ? ?Signed, ?Candee Furbish, MD  ?08/20/2021 8:57 AM    ?[place] ?

## 2021-08-20 NOTE — Assessment & Plan Note (Signed)
She was worried about bilateral calf pain.  Overall reassuring.  Good distal pulses.  She thought she may have peripheral arterial disease.  I reassured her.  Likely musculoskeletal ?

## 2021-08-22 ENCOUNTER — Ambulatory Visit
Admission: EM | Admit: 2021-08-22 | Discharge: 2021-08-22 | Disposition: A | Discharge status: Home / Self Care | Payer: Medicaid Other | Attending: Family Medicine | Admitting: Family Medicine

## 2021-08-22 ENCOUNTER — Other Ambulatory Visit: Payer: Self-pay

## 2021-08-22 DIAGNOSIS — S29019A Strain of muscle and tendon of unspecified wall of thorax, initial encounter: Secondary | ICD-10-CM

## 2021-08-22 DIAGNOSIS — S161XXA Strain of muscle, fascia and tendon at neck level, initial encounter: Secondary | ICD-10-CM

## 2021-08-22 DIAGNOSIS — H538 Other visual disturbances: Secondary | ICD-10-CM | POA: Diagnosis not present

## 2021-08-22 DIAGNOSIS — G44319 Acute post-traumatic headache, not intractable: Secondary | ICD-10-CM | POA: Diagnosis not present

## 2021-08-22 MED ORDER — CYCLOBENZAPRINE HCL 5 MG PO TABS: 5.0000 mg | ORAL_TABLET | Freq: Two times a day (BID) | ORAL | 0 refills | Status: DC | PRN

## 2021-08-22 MED ORDER — NAPROXEN 500 MG PO TABS: 500.0000 mg | ORAL_TABLET | Freq: Two times a day (BID) | ORAL | 0 refills | Status: DC | PRN
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
## 2021-09-03 ENCOUNTER — Other Ambulatory Visit: Payer: Self-pay | Admitting: Family Medicine

## 2021-09-04 NOTE — Telephone Encounter (Signed)
Requested Prescriptions  ?Pending Prescriptions Disp Refills  ?? naproxen (NAPROSYN) 500 MG tablet [Pharmacy Med Name: NAPROXEN 500MG  TABLETS] 30 tablet 0  ?  Sig: TAKE 1 TABLET(500 MG) BY MOUTH TWICE DAILY AS NEEDED  ?  ? There is no refill protocol information for this order  ?  ? ? ?

## 2021-09-08 DIAGNOSIS — Z419 Encounter for procedure for purposes other than remedying health state, unspecified: Secondary | ICD-10-CM | POA: Diagnosis not present

## 2021-09-15 ENCOUNTER — Emergency Department (HOSPITAL_COMMUNITY)
Admission: EM | Admit: 2021-09-15 | Discharge: 2021-09-15 | Disposition: A | Discharge status: Home / Self Care | Payer: Medicaid Other | Attending: Emergency Medicine | Admitting: Emergency Medicine

## 2021-09-15 ENCOUNTER — Emergency Department (HOSPITAL_COMMUNITY): Payer: Medicaid Other

## 2021-09-15 ENCOUNTER — Other Ambulatory Visit: Payer: Self-pay

## 2021-09-15 ENCOUNTER — Encounter (HOSPITAL_COMMUNITY): Payer: Self-pay | Admitting: Emergency Medicine

## 2021-09-15 DIAGNOSIS — R319 Hematuria, unspecified: Secondary | ICD-10-CM | POA: Diagnosis not present

## 2021-09-15 DIAGNOSIS — R1084 Generalized abdominal pain: Secondary | ICD-10-CM | POA: Insufficient documentation

## 2021-09-15 DIAGNOSIS — R109 Unspecified abdominal pain: Secondary | ICD-10-CM | POA: Diagnosis not present

## 2021-09-15 DIAGNOSIS — K828 Other specified diseases of gallbladder: Secondary | ICD-10-CM | POA: Diagnosis not present

## 2021-09-15 DIAGNOSIS — M545 Low back pain, unspecified: Secondary | ICD-10-CM | POA: Diagnosis not present

## 2021-09-15 LAB — COMPREHENSIVE METABOLIC PANEL
ALT: 18 U/L (ref 0–44)
AST: 21 U/L (ref 15–41)
Albumin: 3.5 g/dL (ref 3.5–5.0)
Alkaline Phosphatase: 58 U/L (ref 38–126)
Anion gap: 5 (ref 5–15)
BUN: 8 mg/dL (ref 6–20)
CO2: 25 mmol/L (ref 22–32)
Calcium: 9.2 mg/dL (ref 8.9–10.3)
Chloride: 110 mmol/L (ref 98–111)
Creatinine, Ser: 0.88 mg/dL (ref 0.44–1.00)
GFR, Estimated: >60 mL/min (ref >60)
Glucose, Bld: 91 mg/dL (ref 70–99)
Potassium: 3.5 mmol/L (ref 3.5–5.1)
Sodium: 140 mmol/L (ref 135–145)
Total Bilirubin: 0.6 mg/dL (ref 0.3–1.2)
Total Protein: 6.3 g/dL — ABNORMAL LOW (ref 6.5–8.1)

## 2021-09-15 LAB — CBC WITH DIFFERENTIAL/PLATELET
Abs Immature Granulocytes: 0.03 10*3/uL (ref 0.00–0.07)
Basophils Absolute: 0 10*3/uL (ref 0.0–0.1)
Basophils Relative: 0 %
Eosinophils Absolute: 0.1 10*3/uL (ref 0.0–0.5)
Eosinophils Relative: 2 %
HCT: 38.2 % (ref 36.0–46.0)
Hemoglobin: 12.9 g/dL (ref 12.0–15.0)
Immature Granulocytes: 0 %
Lymphocytes Relative: 29 %
Lymphs Abs: 2.7 10*3/uL (ref 0.7–4.0)
MCH: 29.9 pg (ref 26.0–34.0)
MCHC: 33.8 g/dL (ref 30.0–36.0)
MCV: 88.4 fL (ref 80.0–100.0)
Monocytes Absolute: 0.6 10*3/uL (ref 0.1–1.0)
Monocytes Relative: 6 %
Neutro Abs: 5.7 10*3/uL (ref 1.7–7.7)
Neutrophils Relative %: 63 %
Platelets: 254 10*3/uL (ref 150–400)
RBC: 4.32 MIL/uL (ref 3.87–5.11)
RDW: 12 % (ref 11.5–15.5)
WBC: 9.1 10*3/uL (ref 4.0–10.5)
nRBC: 0 % (ref 0.0–0.2)

## 2021-09-15 LAB — URINALYSIS, ROUTINE W REFLEX MICROSCOPIC
Bilirubin Urine: NEGATIVE
Glucose, UA: NEGATIVE mg/dL
Ketones, ur: NEGATIVE mg/dL
Nitrite: NEGATIVE
Protein, ur: NEGATIVE mg/dL
Specific Gravity, Urine: 1.026 (ref 1.005–1.030)
pH: 6 (ref 5.0–8.0)

## 2021-09-15 LAB — PREGNANCY, URINE: Preg Test, Ur: NEGATIVE

## 2021-09-15 MED ORDER — LABETALOL HCL 100 MG PO TABS: 100.0000 mg | ORAL_TABLET | Freq: Two times a day (BID) | ORAL | 1 refills | Status: DC

## 2021-09-15 NOTE — ED Provider Notes (Signed)
?[facility] EMERGENCY DEPARTMENT ?Provider Note ? ? ?CSN: [id number] ?Arrival date & time: 09/15/21  1127 ? ?  ? ?History ? ?Chief Complaint  ?Patient presents with  ? Flank Pain  ? ? ?Andrea Burns is a 36 y.o. female. ? ?Pt has multiple concerns.  Pt reports pain in her low back and abdominal pain.  Pt reports she thinks she has blood in her urine.  Pt also concerned about having blood in her stool after straining.  Pt reports bleeding with intercourse, but no recent intercourse. Pt concerned about her blood pressure being high.  Pt reports she does not have a primary care MD.  Pt was on BP medications in the past.  Medications were changed during pregnancy and then stopped after delivery  ? ?The history is provided by the patient. No language interpreter was used.  ?Flank Pain ?This is a new problem. The problem has not changed since onset.Associated symptoms include abdominal pain.  ? ?  ? ?Home Medications ?Prior to Admission medications   ?Medication Sig Start Date End Date Taking? Authorizing Provider  ?cyclobenzaprine (FLEXERIL) 5 MG tablet Take 1 tablet (5 mg total) by mouth 2 (two) times daily as needed for muscle spasms. Do not drink alcohol or drive while taking this medication.  May cause drowsiness. 08/22/21   Particia Nearing, PA-C  ?naproxen (NAPROSYN) 500 MG tablet Take 1 tablet (500 mg total) by mouth 2 (two) times daily as needed. 08/22/21   Particia Nearing, PA-C  ?   ? ?Allergies    ?Patient has no known allergies.   ? ?Review of Systems   ?Review of Systems  ?Gastrointestinal:  Positive for abdominal pain.  ?Genitourinary:  Positive for flank pain.  ?All other systems reviewed and are negative. ? ?Physical Exam ?Updated Vital Signs ?BP (!) 150/98   Pulse 72   Temp 97.8 ?F (36.6 ?C)   Resp 18   SpO2 98%  ?Physical Exam ?Vitals and nursing note reviewed.  ?Constitutional:   ?   Appearance: She is well-developed.  ?HENT:  ?   Head: Normocephalic.  ?   Right Ear:  Tympanic membrane normal.  ?   Left Ear: Tympanic membrane normal.  ?   Mouth/Throat:  ?   Mouth: Mucous membranes are moist.  ?Eyes:  ?   Pupils: Pupils are equal, round, and reactive to light.  ?Cardiovascular:  ?   Rate and Rhythm: Normal rate and regular rhythm.  ?Pulmonary:  ?   Effort: Pulmonary effort is normal.  ?   Breath sounds: Normal breath sounds.  ?Abdominal:  ?   General: Abdomen is flat. There is no distension.  ?Musculoskeletal:     ?   General: Normal range of motion.  ?   Cervical back: Normal range of motion.  ?Skin: ?   General: Skin is warm and dry.  ?Neurological:  ?   General: No focal deficit present.  ?   Mental Status: She is alert and oriented to person, place, and time.  ?Psychiatric:     ?   Mood and Affect: Mood normal.  ? ? ?ED Results / Procedures / Treatments   ?Labs ?(all labs ordered are listed, but only abnormal results are displayed) ?Labs Reviewed  ?CBC WITH DIFFERENTIAL/PLATELET  ?COMPREHENSIVE METABOLIC PANEL  ?URINALYSIS, ROUTINE W REFLEX MICROSCOPIC  ?PREGNANCY, URINE  ? ? ?EKG ?None ? ?Radiology ?No results found. ? ?Procedures ?Procedures  ? ? ?Medications Ordered in ED ?Medications - No data  to display ? ?ED Course/ Medical Decision Making/ A&P ?  ?                        ?Medical Decision Making ?Amount and/or Complexity of Data Reviewed ?Labs: ordered. ?Radiology: ordered. ? ?Risk ?Prescription drug management. ? ? ?Pt advised to follow up with her gynecologist.  Rx for labetalol for hypertension  ? ? ? ? ? ? ? ?Final Clinical Impression(s) / ED Diagnoses ?Final diagnoses:  ?Generalized abdominal pain  ? ? ?Rx / DC Orders ?ED Discharge Orders   ? ? None  ? ?  ? ?An After Visit Summary was printed and given to the patient. ? ?  ?Elson Areas, [place] ?09/15/21 1526 ? ?  ?Pricilla Loveless, MD ?09/16/21 [phone] ? ?
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
## 2021-09-15 NOTE — Discharge Instructions (Addendum)
Your kidneys are normal.  No evidence of stones  ?

## 2021-09-17 ENCOUNTER — Telehealth: Payer: Self-pay

## 2021-09-17 NOTE — Telephone Encounter (Signed)
Transition Care Management Unsuccessful Follow-up Telephone Call ? ?Date of discharge and from where:  09/15/2021-[place]  ? ?Attempts:  1st Attempt ? ?Reason for unsuccessful TCM follow-up call:  Left voice message ? ?  ?

## 2021-09-18 NOTE — Telephone Encounter (Signed)
Transition Care Management Unsuccessful Follow-up Telephone Call ? ?Date of discharge and from where:  09/15/2021-[place]  ? ?Attempts:  2nd Attempt ? ?Reason for unsuccessful TCM follow-up call:  Left voice message ? ?  ?

## 2021-09-19 NOTE — Telephone Encounter (Signed)
Transition Care Management Unsuccessful Follow-up Telephone Call ? ?Date of discharge and from where:  09/15/2021-[place]  ? ?Attempts:  3rd Attempt ? ?Reason for unsuccessful TCM follow-up call:  Left voice message ? ?  ?

## 2021-10-04 DIAGNOSIS — T50905A Adverse effect of unspecified drugs, medicaments and biological substances, initial encounter: Secondary | ICD-10-CM | POA: Diagnosis not present

## 2021-10-04 DIAGNOSIS — G4440 Drug-induced headache, not elsewhere classified, not intractable: Secondary | ICD-10-CM | POA: Diagnosis not present

## 2021-10-04 DIAGNOSIS — T505X5A Adverse effect of appetite depressants, initial encounter: Secondary | ICD-10-CM | POA: Diagnosis not present

## 2021-10-04 DIAGNOSIS — I10 Essential (primary) hypertension: Secondary | ICD-10-CM | POA: Diagnosis not present

## 2021-10-04 DIAGNOSIS — R519 Headache, unspecified: Secondary | ICD-10-CM | POA: Diagnosis not present

## 2021-10-04 DIAGNOSIS — H5213 Myopia, bilateral: Secondary | ICD-10-CM | POA: Diagnosis not present

## 2021-10-04 DIAGNOSIS — I158 Other secondary hypertension: Secondary | ICD-10-CM | POA: Diagnosis not present

## 2021-10-05 ENCOUNTER — Telehealth: Payer: Self-pay

## 2021-10-05 NOTE — Telephone Encounter (Signed)
Transition Care Management Follow-up Telephone Call ?Date of discharge and from where: 10/04/2021 from [facility] ?How have you been since you were released from the hospital? Patient stated that she still has a headache. Patient stated that she is feeling weak and nauseous.  ?Any questions or concerns? No ? ?Items Reviewed: ?Did the pt receive and understand the discharge instructions provided? Yes  ?Medications obtained and verified? Yes  ?Other? No  ?Any new allergies since your discharge? No  ?Dietary orders reviewed? No ?Do you have support at home? Yes  ? ?Functional Questionnaire: (I = Independent and D = Dependent) ?ADLs: I ? ?Bathing/Dressing- I ? ?Meal Prep- I ? ?Eating- I ? ?Maintaining continence- I ? ?Transferring/Ambulation- I ? ?Managing Meds- I ? ? ?Follow up appointments reviewed: ? ?PCP Hospital f/u appt confirmed? No  Patient sent PCP information.  ?Specialist Hospital f/u appt confirmed? No  Patient will call OBGYN to schedule follow up.  ?Are transportation arrangements needed? No  ?If their condition worsens, is the pt aware to call PCP or go to the Emergency Dept.? Yes ?Was the patient provided with contact information for the PCP's office or ED? Yes ?Was to pt encouraged to call back with questions or concerns? Yes ? ?

## 2021-10-08 DIAGNOSIS — Z419 Encounter for procedure for purposes other than remedying health state, unspecified: Secondary | ICD-10-CM | POA: Diagnosis not present

## 2021-10-15 ENCOUNTER — Ambulatory Visit: Payer: Medicaid Other | Admitting: Family Medicine

## 2021-11-08 DIAGNOSIS — Z419 Encounter for procedure for purposes other than remedying health state, unspecified: Secondary | ICD-10-CM | POA: Diagnosis not present

## 2021-12-08 DIAGNOSIS — Z419 Encounter for procedure for purposes other than remedying health state, unspecified: Secondary | ICD-10-CM | POA: Diagnosis not present

## 2021-12-14 IMAGING — US US PELVIS COMPLETE WITH TRANSVAGINAL
1 series · 15 of 25 positions shown · non-contrast
Comparison: None

CLINICAL DATA: Postcoital bleeding

EXAM:
TRANSABDOMINAL AND TRANSVAGINAL ULTRASOUND OF PELVIS
TECHNIQUE: Both transabdominal and transvaginal ultrasound examinations of the
pelvis were performed. Transabdominal technique was performed for
global imaging of the pelvis including uterus, ovaries, adnexal
regions, and pelvic cul-de-sac. It was necessary to proceed with
endovaginal exam following the transabdominal exam to visualize the
uterus and adnexa.

[Series 1: us pelvis complete mc & wl · 15 of 58 slices shown]
[im 1/58]
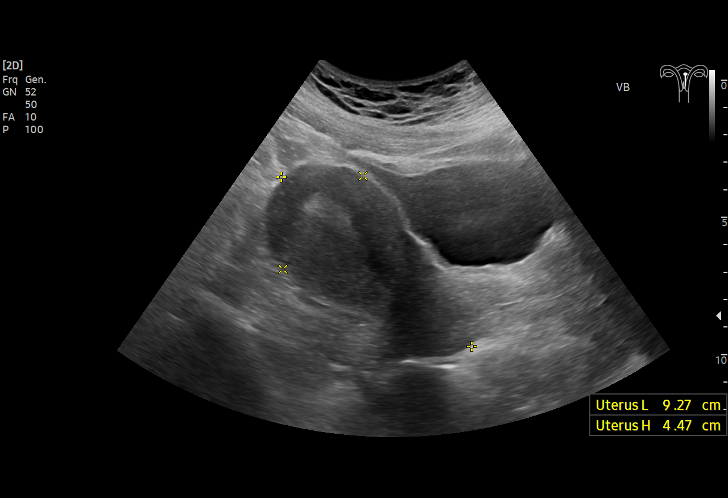
[im 5/58]
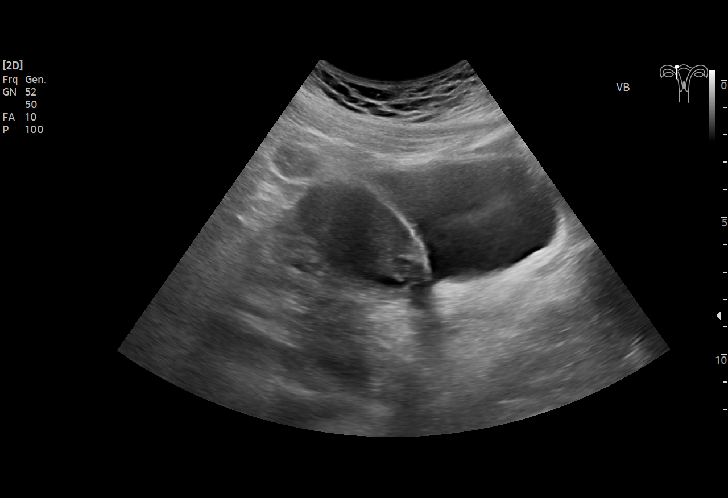
[im 10/58]
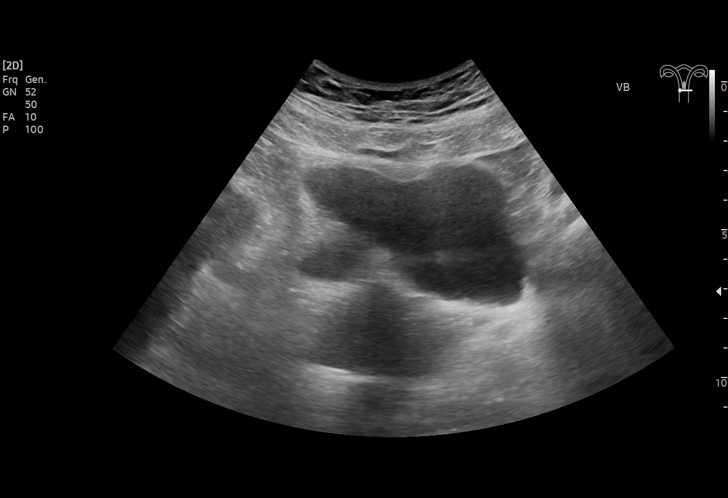
[im 12/58]
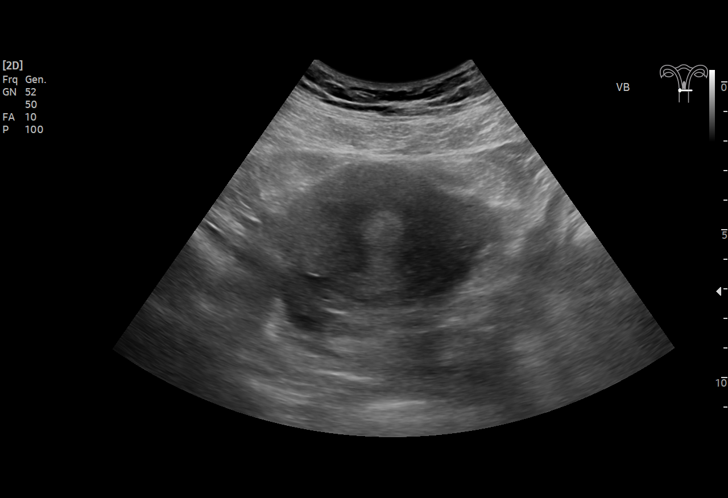
[im 17/58]
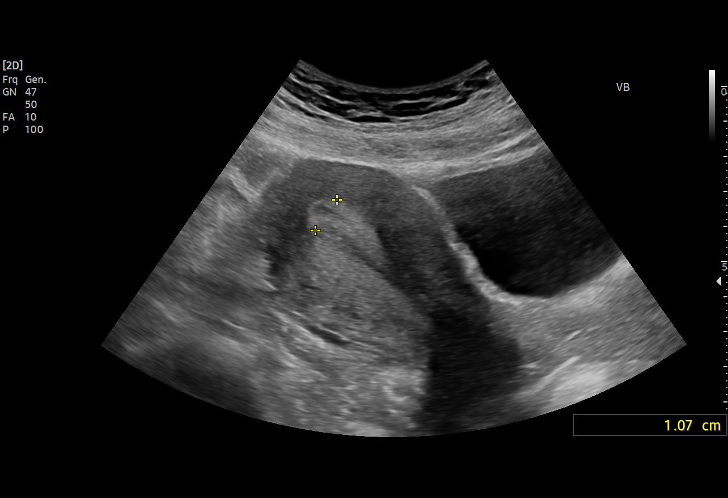
[im 22/58]
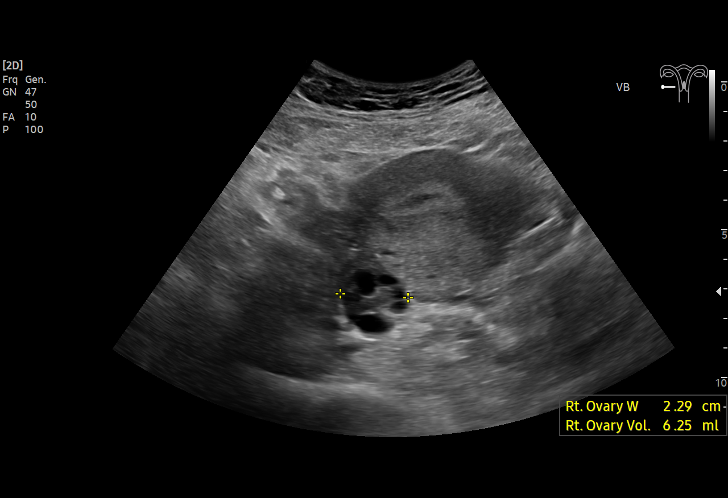
[im 24/58]
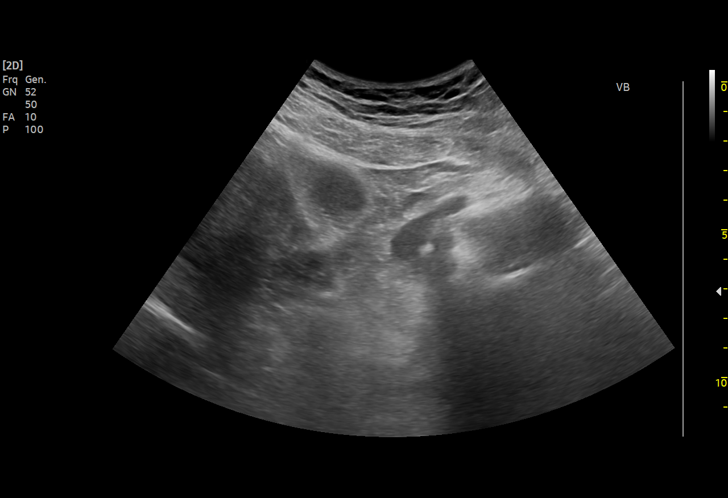
[im 29/58]
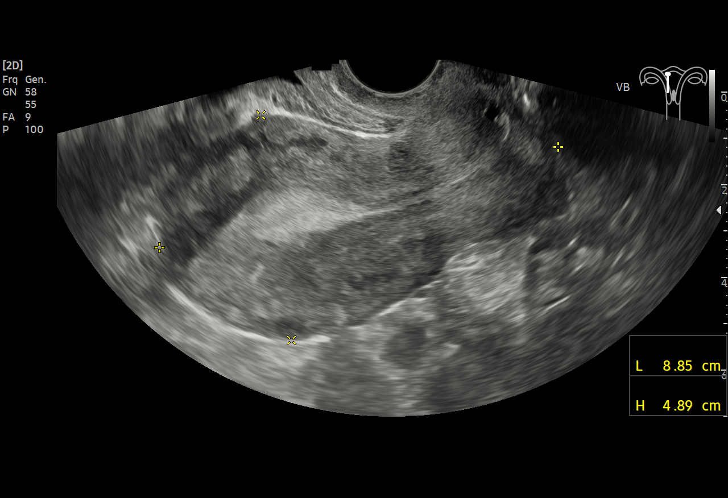
[im 34/58]
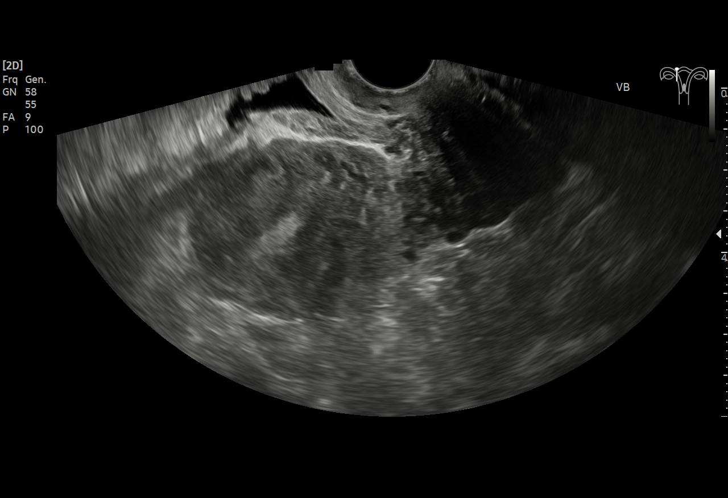
[im 36/58]
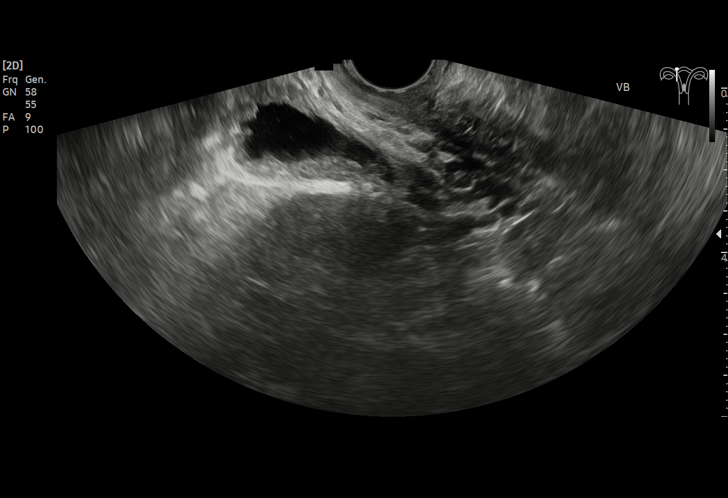
[im 41/58]
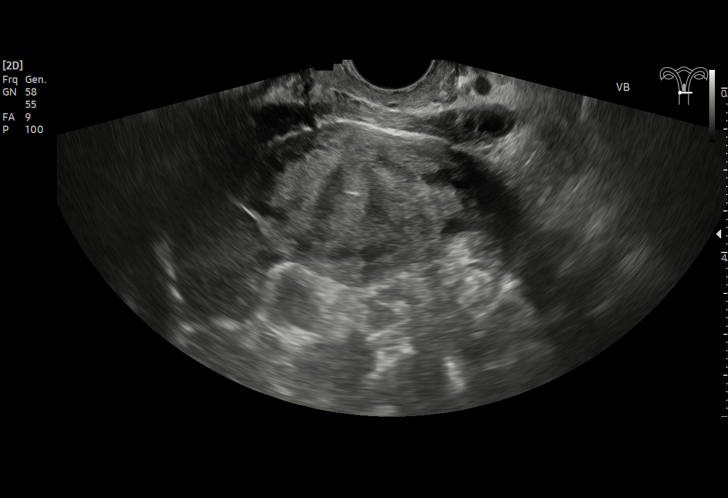
[im 46/58]
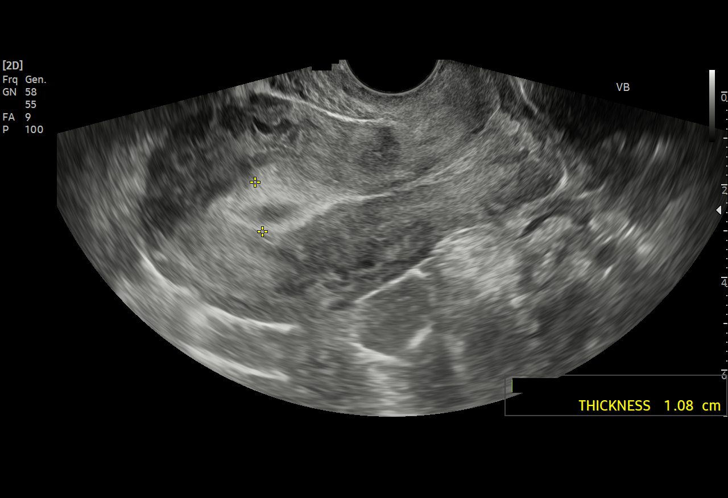
[im 48/58]
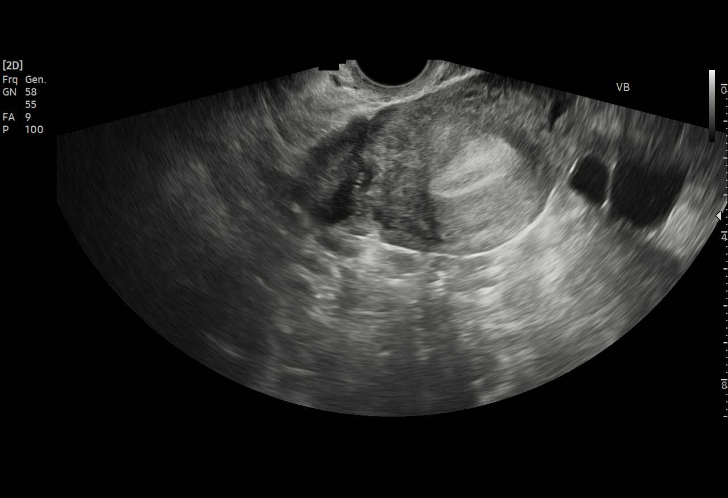
[im 53/58]
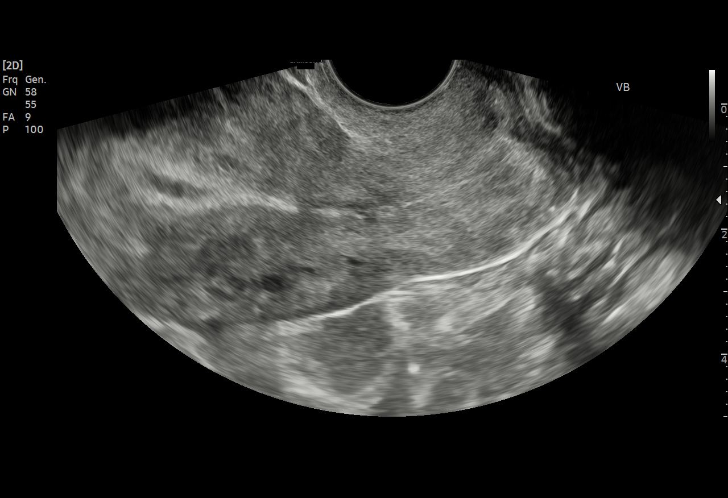
[im 58/58]
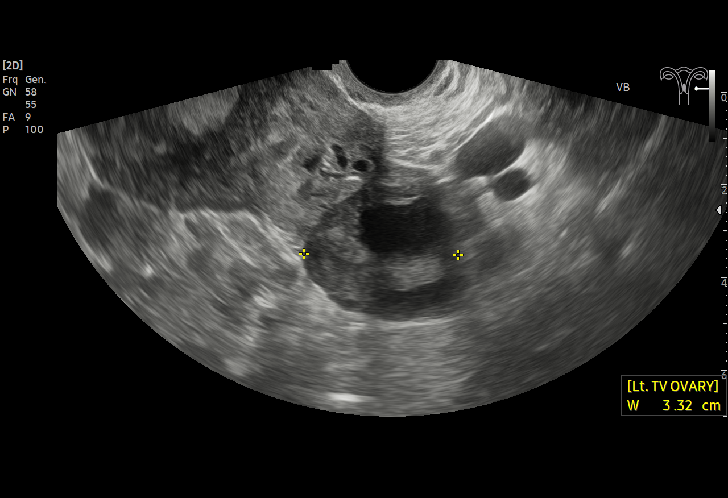

[15 of 25 positions shown; findings below may reference images not displayed]

FINDINGS: Uterus

Measurements: 8.9 x 4.9 x 6.1 cm = volume: 138 mL. No fibroids or
other mass visualized.

Endometrium

Thickness: 11 mm. Small amount of fluid present within the
endometrial stripe.

Right ovary

Measurements: 3.5 x 2.8 x 3.0 cm = volume: 50 mL. Normal appearance.
No adnexal mass. Follicles are seen.

Left ovary

Measurements: 3.8 x 3.1 x 3.3 cm = volume: 21 mL. Normal appearance.
No adnexal mass. Follicles are seen.

Other findings

No abnormal free fluid.
IMPRESSION: No significant sonographic abnormality of the uterus or ovaries.

## 2022-01-28 ENCOUNTER — Ambulatory Visit
Admission: EM | Admit: 2022-01-28 | Discharge: 2022-01-28 | Disposition: A | Discharge status: Home / Self Care | Payer: Medicaid Other

## 2022-01-28 ENCOUNTER — Encounter (HOSPITAL_COMMUNITY): Payer: Self-pay

## 2022-01-28 ENCOUNTER — Other Ambulatory Visit: Payer: Self-pay

## 2022-01-28 DIAGNOSIS — I10 Essential (primary) hypertension: Secondary | ICD-10-CM | POA: Insufficient documentation

## 2022-01-28 DIAGNOSIS — Z8679 Personal history of other diseases of the circulatory system: Secondary | ICD-10-CM

## 2022-01-28 DIAGNOSIS — Z5321 Procedure and treatment not carried out due to patient leaving prior to being seen by health care provider: Secondary | ICD-10-CM | POA: Insufficient documentation

## 2022-01-28 DIAGNOSIS — R079 Chest pain, unspecified: Secondary | ICD-10-CM

## 2022-01-28 DIAGNOSIS — Z7982 Long term (current) use of aspirin: Secondary | ICD-10-CM | POA: Insufficient documentation

## 2022-01-28 NOTE — ED Triage Notes (Signed)
Pov from home. Went to [employer] today. States that her blood pressure has been high because she has been out of her meds. Does not have pcp. UC told her to come here.  Took labetalol, aspirin, and a blood thinner around 4pm  191/119 before she took meds

## 2022-01-28 NOTE — ED Provider Notes (Addendum)
[facility]    CSN: [id number] Arrival date & time: 01/28/22  1401      History   Chief Complaint Chief Complaint  Patient presents with   Chest Pain    HPI Andrea Burns is a 36 y.o. female.   Patient presents with left-sided chest pain that began this morning.  She reports a medical history significant for hypertension, however has been out of her medication for quite a while now.  Reports she took 0.1 mg of clonidine this morning which has not helped with the chest pain.  She endorses headache, however denies dizziness/lightheadedness, vision changes, lower extremity swelling.    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   Medical history non-contributory    Pneumonia    2013   Vaginal Pap smear, abnormal     Patient Active Problem List   Diagnosis Date Noted   Elevated blood pressure affecting pregnancy in first trimester, antepartum 08/20/2021   Leg pain 08/20/2021   Gallstones without obstruction of gallbladder 01/22/2021   Status post bilateral salpingectomy 12/11/2020   Chronic migraine w/o aura w/o status migrainosus, not intractable 10/06/2020   History of substance abuse (HCC) 05/23/2020    Past Surgical History:  Procedure Laterality Date   APPENDECTOMY     COLPOSCOPY W/ BIOPSY / CURETTAGE     IUD REMOVAL     LAPAROSCOPIC APPENDECTOMY N/A 10/18/2019   Procedure: APPENDECTOMY LAPAROSCOPIC;  Surgeon: [name], MD;  Location: [facility];  Service: General;  Laterality: N/A;   LAPAROSCOPY ABDOMEN DIAGNOSTIC     Removal of migrated IUD    TUBAL LIGATION N/A 12/11/2020   Procedure: POST PARTUM TUBAL LIGATION;  Surgeon: Tereso Newcomer, MD;  Location: [facility];  Service: Gynecology;  Laterality: N/A;    OB History     Gravida  6   Para   5   Term  5   Preterm      AB  1   Living  5      SAB  1   IAB      Ectopic      Multiple  0   Live Births  5            Home Medications    Prior to Admission medications   Medication Sig Start Date End Date Taking? Authorizing Provider  cyclobenzaprine (FLEXERIL) 5 MG tablet Take 1 tablet (5 mg total) by mouth 2 (two) times daily as needed for muscle spasms. Do not drink alcohol or drive while taking this medication.  May cause drowsiness. 08/22/21   Particia Nearing, PA-C  labetalol (NORMODYNE) 100 MG tablet Take 1 tablet (100 mg total) by mouth 2 (two) times daily. 09/15/21   Elson Areas, PA-C  naproxen (NAPROSYN) 500 MG tablet Take 1 tablet (500 mg total) by mouth 2 (two) times daily as needed. 08/22/21   Particia Nearing, PA-C    Family History Family History  Problem Relation Age of Onset   Diabetes Mother    Hypertension Mother    Cancer Mother    Diabetes Father    Cancer Paternal Grandmother        liver & lung    Social History Social History  Tobacco Use   Smoking status: Former    Packs/day: 0.25    Years: 1.00    Total pack years: 0.25    Types: Cigarettes    Quit date: 05/03/2005    Years since quitting: 16.7   Smokeless tobacco: Never  Vaping Use   Vaping Use: Never used  Substance Use Topics   Alcohol use: Yes    Comment: Occass   Drug use: Not Currently    Types: Cocaine    Comment: last used August 2021     Allergies   Patient has no known allergies.   Review of Systems Review of Systems Per HPI  Physical Exam Triage Vital Signs ED Triage Vitals  Enc Vitals Group     BP 01/28/22 1441 (!) 156/107     Pulse Rate 01/28/22 1441 88     Resp --      Temp 01/28/22 1441 98 F (36.7 C)     Temp src --      SpO2 01/28/22 1441 97 %     Weight --      Height --      Head Circumference --      Peak Flow --      Pain Score 01/28/22 1439 9     Pain Loc --      Pain Edu? --      Excl. in GC? --    No  data found.  Updated Vital Signs BP (!) 156/107 Comment: pt took .1 clonidine at 8 am  Pulse 88   Temp 98 F (36.7 C)   SpO2 97%   Visual Acuity Right Eye Distance:   Left Eye Distance:   Bilateral Distance:    Right Eye Near:   Left Eye Near:    Bilateral Near:     Physical Exam Vitals and nursing note reviewed.  Constitutional:      General: She is not in acute distress.    Appearance: She is well-developed. She is obese. She is not toxic-appearing.  Pulmonary:     Effort: Pulmonary effort is normal.  Skin:    Coloration: Skin is not cyanotic or pale.     Findings: No erythema.  Neurological:     Mental Status: She is alert and oriented to person, place, and time.  Psychiatric:        Behavior: Behavior is cooperative.      UC Treatments / Results  Labs (all labs ordered are listed, but only abnormal results are displayed) Labs Reviewed - No data to display  EKG   Radiology No results found.  Procedures Procedures (including critical care time)  Medications Ordered in UC Medications - No data to display  Initial Impression / Assessment and Plan / UC Course  I have reviewed the triage vital signs and the nursing notes.  Pertinent labs & imaging results that were available during my care of the patient were reviewed by me and considered in my medical decision making (see chart for details).    Patient is a well-appearing 36 year old female presenting for chest pain.  She has a medical history significant for high blood pressure and her blood pressure is elevated today.  I recommended she immediately be seen in the emergency room for further evaluation of the chest pain and high blood pressure.  The patient was given the opportunity to ask questions.  All questions answered to their satisfaction.  The patient is in agreement to this plan.   Patient is safe to  transport via private vehicle at this time reports she will go directly to the emergency room. Final  Clinical Impressions(s) / UC Diagnoses   Final diagnoses:  Chest pain, unspecified type  History of hypertension     Discharge Instructions      - Please go directly to the emergency room for further evaluation of the chest pain   ED Prescriptions   None    PDMP not reviewed this encounter.   Valentino Nose, NP 01/28/22 1532    Valentino Nose, NP 01/28/22 [phone]

## 2022-01-28 NOTE — Discharge Instructions (Signed)
Please go directly to the emergency room for further evaluation of the chest pain 

## 2022-01-28 NOTE — ED Triage Notes (Signed)
Pt presents with left side chest pain that began this am, has had hypertension but has not had BP medication in a while

## 2022-01-29 ENCOUNTER — Emergency Department (HOSPITAL_COMMUNITY)
Admission: EM | Admit: 2022-01-29 | Discharge: 2022-01-29 | Discharge status: Against Medical Advice | Payer: Self-pay | Attending: Emergency Medicine | Admitting: Emergency Medicine

## 2022-02-04 ENCOUNTER — Ambulatory Visit (INDEPENDENT_AMBULATORY_CARE_PROVIDER_SITE_OTHER): Payer: Self-pay | Admitting: Internal Medicine

## 2022-02-04 ENCOUNTER — Encounter: Payer: Self-pay | Admitting: Internal Medicine

## 2022-02-04 VITALS — BP 132/84 | HR 79 | Ht 62.0 in | Wt 175.0 lb

## 2022-02-04 DIAGNOSIS — F419 Anxiety disorder, unspecified: Secondary | ICD-10-CM | POA: Insufficient documentation

## 2022-02-04 DIAGNOSIS — Z8719 Personal history of other diseases of the digestive system: Secondary | ICD-10-CM | POA: Insufficient documentation

## 2022-02-04 DIAGNOSIS — I10 Essential (primary) hypertension: Secondary | ICD-10-CM | POA: Insufficient documentation

## 2022-02-04 DIAGNOSIS — K219 Gastro-esophageal reflux disease without esophagitis: Secondary | ICD-10-CM

## 2022-02-04 DIAGNOSIS — Z23 Encounter for immunization: Secondary | ICD-10-CM

## 2022-02-04 DIAGNOSIS — Z0000 Encounter for general adult medical examination without abnormal findings: Secondary | ICD-10-CM | POA: Insufficient documentation

## 2022-02-04 DIAGNOSIS — Z6832 Body mass index (BMI) 32.0-32.9, adult: Secondary | ICD-10-CM

## 2022-02-04 MED ORDER — LOSARTAN POTASSIUM 25 MG PO TABS: 25.0000 mg | ORAL_TABLET | Freq: Every day | ORAL | 2 refills | Status: DC

## 2022-02-04 NOTE — Progress Notes (Signed)
New Patient Office Visit  Subjective    Patient ID: Andrea Burns, female    DOB: 10/05/1985  Age: 36 y.o. MRN: [id number]  CC:  Chief Complaint  Patient presents with   Establish Care    Increased anxiety    Pancreatitis    Per ED visit     HPI Andrea Burns presents to establish care.  She is a 36 year old woman with past medical history significant for hypertension, preeclampsia, anxiety, recent pancreatitis, and GERD.  She was seen last week at the ED in [facility] for epigastric pain, neck pain, and a headache.  Treated with headache cocktail and her symptoms improved, however her lipase level was elevated (162) and she was told that she had acute pancreatitis.  Today she endorses vague epigastric discomfort and low back pain but denies symptoms of headache or neck pain.  She otherwise denies acute concerns or symptoms currently.  Medical issues discussed today individually addressed in A/P below.  Outpatient Encounter Medications as of 02/04/2022  Medication Sig   albuterol (VENTOLIN HFA) 108 (90 Base) MCG/ACT inhaler Inhale 2 puffs into the lungs every 6 (six) hours as needed for wheezing or shortness of breath.   losartan (COZAAR) 25 MG tablet Take 1 tablet (25 mg total) by mouth daily.   [DISCONTINUED] albuterol (PROVENTIL) (2.5 MG/3ML) 0.083% nebulizer solution Inhale 2.5 mg into the lungs every 6 (six) hours as needed for wheezing or shortness of breath.   labetalol (NORMODYNE) 100 MG tablet Take 1 tablet (100 mg total) by mouth 2 (two) times daily. (Patient not taking: Reported on 02/04/2022)   [DISCONTINUED] cyclobenzaprine (FLEXERIL) 5 MG tablet Take 1 tablet (5 mg total) by mouth 2 (two) times daily as needed for muscle spasms. Do not drink alcohol or drive while taking this medication.  May cause drowsiness. (Patient not taking: Reported on 02/04/2022)   [DISCONTINUED] naproxen (NAPROSYN) 500 MG tablet Take 1 tablet (500 mg total) by mouth 2 (two) times daily as needed.  (Patient not taking: Reported on 02/04/2022)   No facility-administered encounter medications on file as of 02/04/2022.    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   Medical history non-contributory    Pneumonia    2013   Vaginal Pap smear, abnormal     Past Surgical History:  Procedure Laterality Date   APPENDECTOMY     COLPOSCOPY W/ BIOPSY / CURETTAGE     IUD REMOVAL     LAPAROSCOPIC APPENDECTOMY N/A 10/18/2019   Procedure: APPENDECTOMY LAPAROSCOPIC;  Surgeon: Virl Cagey, MD;  Location: [facility];  Service: General;  Laterality: N/A;   LAPAROSCOPY ABDOMEN DIAGNOSTIC     Removal of migrated IUD    TUBAL LIGATION N/A 12/11/2020   Procedure: POST PARTUM TUBAL LIGATION;  Surgeon: Osborne Oman, MD;  Location: [facility];  Service: Gynecology;  Laterality: N/A;    Family History  Problem Relation Age of Onset   Diabetes Mother    Hypertension Mother    Cancer Mother    Diabetes Father    Cancer Paternal Grandmother        liver & lung    Social History   Socioeconomic History   Marital status: Married    Spouse name: Joseluis  [place]   Number of children: 4   Years of education: Not on file   Highest education level: Not on file  Occupational History   Not on file  Tobacco Use   Smoking status: Former    Packs/day: 0.25    Years: 1.00    Total pack years: 0.25    Types: Cigarettes    Quit date: 05/03/2005    Years since quitting: 16.7   Smokeless tobacco: Never  Vaping Use   Vaping Use: Never used  Substance and Sexual Activity   Alcohol use: Yes    Comment: Occass   Drug use: Not Currently    Types: Cocaine    Comment: last used August 2021   Sexual activity: Yes    Partners: Male    Birth control/protection: None  Other Topics Concern   Not on file   Social History Narrative   Not on file   Social Determinants of Health   Financial Resource Strain: Not on file  Food Insecurity: Food Insecurity Present (11/15/2020)   Hunger Vital Sign    Worried About Running Out of Food in the Last Year: Sometimes true    Ran Out of Food in the Last Year: Sometimes true  Transportation Needs: No Transportation Needs (11/15/2020)   PRAPARE - Hydrologist (Medical): No    Lack of Transportation (Non-Medical): No  Physical Activity: Not on file  Stress: Stress Concern Present (03/28/2021)   [place]    Feeling of Stress : Rather much  Social Connections: Not on file  Intimate Partner Violence: Not on file    Review of Systems  Constitutional:  Negative for chills and fever.  HENT:  Negative for sore throat.   Respiratory:  Negative for cough and shortness of breath.   Cardiovascular:  Negative for chest pain, palpitations and leg swelling.  Gastrointestinal:  Positive for abdominal pain. Negative for blood in stool, constipation, diarrhea, nausea and vomiting.  Genitourinary:  Negative for dysuria and hematuria.  Musculoskeletal:  Positive for back pain. Negative for myalgias.  Skin:  Negative for itching and rash.  Neurological:  Negative for dizziness and headaches.  Psychiatric/Behavioral:  Negative for depression and suicidal ideas.     Objective    BP 132/84   Pulse 79   Ht _0  (1.575 m)   Wt 175 lb (79.4 kg)   SpO2 97%   BMI 32.01 kg/m   Physical Exam Vitals reviewed.  Constitutional:      General: She is not in acute distress.    Appearance: Normal appearance. She is normal weight. She is not toxic-appearing.  HENT:     Head: Normocephalic and atraumatic.     Nose: Nose normal. No congestion or rhinorrhea.     Mouth/Throat:     Mouth: Mucous membranes are moist.     Pharynx: Oropharynx is clear. No oropharyngeal exudate or  posterior oropharyngeal erythema.  Eyes:     General: No scleral icterus.    Conjunctiva/sclera: Conjunctivae normal.     Pupils: Pupils are equal, round, and reactive to light.  Cardiovascular:     Rate and Rhythm: Normal rate and regular rhythm.     Pulses: Normal pulses.     Heart sounds: Normal heart sounds.  Pulmonary:     Effort: Pulmonary effort is normal.     Breath sounds: Normal breath sounds. No wheezing, rhonchi or rales.  Abdominal:  General: Bowel sounds are normal. There is no distension.     Palpations: Abdomen is soft.     Comments: Mild tenderness to palpation of epigastric region  Musculoskeletal:        General: No swelling, tenderness or deformity. Normal range of motion.  Skin:    General: Skin is warm and dry.     Capillary Refill: Capillary refill takes less than 2 seconds.     Coloration: Skin is not jaundiced.  Neurological:     General: No focal deficit present.     Mental Status: She is alert and oriented to person, place, and time.     Motor: No weakness.     Gait: Gait normal.  Psychiatric:        Mood and Affect: Mood normal.        Behavior: Behavior normal.        Thought Content: Thought content normal.     Last CBC Lab Results  Component Value Date   WBC 9.1 09/15/2021   HGB 12.9 09/15/2021   HCT 38.2 09/15/2021   MCV 88.4 09/15/2021   MCH 29.9 09/15/2021   RDW 12.0 09/15/2021   PLT 254 66/29/4765   Last metabolic panel Lab Results  Component Value Date   GLUCOSE 91 09/15/2021   NA 140 09/15/2021   K 3.5 09/15/2021   CL 110 09/15/2021   CO2 25 09/15/2021   BUN 8 09/15/2021   CREATININE 0.88 09/15/2021   GFRNONAA >60 09/15/2021   CALCIUM 9.2 09/15/2021   PROT 6.3 (L) 09/15/2021   ALBUMIN 3.5 09/15/2021   LABGLOB 2.9 10/19/2020   AGRATIO 1.3 10/19/2020   BILITOT 0.6 09/15/2021   ALKPHOS 58 09/15/2021   AST 21 09/15/2021   ALT 18 09/15/2021   ANIONGAP 5 09/15/2021   Last lipids No results found for: "CHOL", "HDL",  "LDLCALC", "LDLDIRECT", "TRIG", "CHOLHDL" Last hemoglobin A1c Lab Results  Component Value Date   HGBA1C 4.9 05/19/2020   Last thyroid functions Lab Results  Component Value Date   TSH 2.020 10/06/2020   T4TOTAL 8.4 10/06/2020   Last vitamin D Lab Results  Component Value Date   VD25OH 23 (L) 09/17/2012   Last vitamin B12 and Folate Lab Results  Component Value Date   VITAMINB12 404 10/06/2020     Assessment & Plan:   Problem List Items Addressed This Visit       Cardiovascular and Mediastinum   Essential hypertension - Primary    She reports taking lisinopril prior to her pregnancy.  Pregnancy complicated by preeclampsia.  She continues to experience hypertension postpartum.  She is not currently take any medication for blood pressure.  BP today 132/84.  She reports at work she had a reading of 217/134 recently.  On review of her recent encounters, her blood pressures also been elevated. -Start losartan 25 mg daily today. -Plan for follow-up in 2 weeks for BP check and repeat labs at that time.      Relevant Medications   losartan (COZAAR) 25 MG tablet   Other Relevant Orders   CMP14+EGFR     Digestive   GERD (gastroesophageal reflux disease)    She is currently taking omeprazole as needed.  Endorses GERD symptoms today.  We discussed taking omeprazole daily as opposed to only when needed.        Other   Anxiety    Recently seen in ED for chest/epigastric pain and anxiety.  She was prescribed Atarax for as needed use.  She  reports that her anxiety is situational and she does not feel anxious all the time.  Denies anxiety currently.  We discussed that she can try using Atarax since it has already been prescribed for her and if she finds that she needs a daily medication, we can discuss starting an SSRI.      History of acute pancreatitis    Recently seen in ED for epigastric pain.  Lipase elevated at 162.  She was told that she has pancreatitis and her symptoms  were attributed to alcohol consumption, however she states that she does not consume alcohol on a regular basis.  She endorses dull epigastric pain today.  I have counseled her on the brat diet and have started her to return to care if her abdominal pain worsens.      BMI 32.0-32.9,adult    Previously followed at the bariatric center and was taking phentermine.  She states that she has lost close to 20 pounds recently.  She is no longer taking phentermine.  Congratulated her on her success and encouraged her to continue working to incorporate exercise in her daily routine in addition to consuming a low-fat diet.      Preventative health care    -Baseline labs ordered today -Influenza vaccine administered -She will bring her COVID-19 vaccine records to her next appointment for [place] to update her records -Additional HM items up-to-date      Relevant Orders   CMP14+EGFR   Lipid Profile   Vitamin D (25 hydroxy)   HgB A1c   Flu Vaccine QUAD 6+ mos PF IM (Fluarix Quad PF) (Completed)    Return in about 2 weeks (around 02/18/2022) for BP check.   Johnette Abraham, MD

## 2022-02-04 NOTE — Patient Instructions (Signed)
It was a pleasure to see you today.  Thank you for giving [place] the opportunity to be involved in your care.  Below is a brief recap of your visit and next steps.  We will plan to see you again in 2 weeks.  Summary We are starting losartan today for your blood pressure. We will follow up in 2 weeks for a BP check Checking base line labs today You can take atarax as needed for anxiety, if your anxiety worsens please let me know and we can discuss starting a daily medicine  Next steps 2 week follow up for blood pressure check I will let you know about your lab results tomorrow

## 2022-02-04 NOTE — Assessment & Plan Note (Signed)
She reports taking lisinopril prior to her pregnancy.  Pregnancy complicated by preeclampsia.  She continues to experience hypertension postpartum.  She is not currently take any medication for blood pressure.  BP today 132/84.  She reports at work she had a reading of 217/134 recently.  On review of her recent encounters, her blood pressures also been elevated. -Start losartan 25 mg daily today. -Plan for follow-up in 2 weeks for BP check and repeat labs at that time.

## 2022-02-04 NOTE — Assessment & Plan Note (Signed)
Previously followed at the bariatric center and was taking phentermine.  She states that she has lost close to 20 pounds recently.  She is no longer taking phentermine.  Congratulated her on her success and encouraged her to continue working to incorporate exercise in her daily routine in addition to consuming a low-fat diet.

## 2022-02-04 NOTE — Assessment & Plan Note (Signed)
-  Baseline labs ordered today -Influenza vaccine administered -She will bring her COVID-19 vaccine records to her next appointment for [place] to update her records -Additional HM items up-to-date

## 2022-02-04 NOTE — Assessment & Plan Note (Signed)
She is currently taking omeprazole as needed.  Endorses GERD symptoms today.  We discussed taking omeprazole daily as opposed to only when needed.

## 2022-02-04 NOTE — Assessment & Plan Note (Signed)
Recently seen in ED for epigastric pain.  Lipase elevated at 162.  She was told that she has pancreatitis and her symptoms were attributed to alcohol consumption, however she states that she does not consume alcohol on a regular basis.  She endorses dull epigastric pain today.  I have counseled her on the brat diet and have started her to return to care if her abdominal pain worsens.

## 2022-02-04 NOTE — Assessment & Plan Note (Signed)
Recently seen in ED for chest/epigastric pain and anxiety.  She was prescribed Atarax for as needed use.  She reports that her anxiety is situational and she does not feel anxious all the time.  Denies anxiety currently.  We discussed that she can try using Atarax since it has already been prescribed for her and if she finds that she needs a daily medication, we can discuss starting an SSRI.

## 2022-02-05 LAB — CMP14+EGFR
ALT: 22 IU/L (ref 0–32)
AST: 24 IU/L (ref 0–40)
Albumin/Globulin Ratio: 1.9 (ref 1.2–2.2)
Albumin: 4.5 g/dL (ref 3.9–4.9)
Alkaline Phosphatase: 75 IU/L (ref 44–121)
BUN/Creatinine Ratio: 12 (ref 9–23)
BUN: 11 mg/dL (ref 6–20)
Bilirubin Total: 0.4 mg/dL (ref 0.0–1.2)
CO2: 24 mmol/L (ref 20–29)
Calcium: 9.6 mg/dL (ref 8.7–10.2)
Chloride: 103 mmol/L (ref 96–106)
Creatinine, Ser: 0.92 mg/dL (ref 0.57–1.00)
Globulin, Total: 2.4 g/dL (ref 1.5–4.5)
Glucose: 86 mg/dL (ref 70–99)
Potassium: 4.1 mmol/L (ref 3.5–5.2)
Sodium: 139 mmol/L (ref 134–144)
Total Protein: 6.9 g/dL (ref 6.0–8.5)
eGFR: 83 mL/min/{1.73_m2} (ref >59)

## 2022-02-05 LAB — VITAMIN D 25 HYDROXY (VIT D DEFICIENCY, FRACTURES): Vit D, 25-Hydroxy: 20.5 ng/mL — ABNORMAL LOW (ref 30.0–100.0)

## 2022-02-05 LAB — LIPID PANEL
Chol/HDL Ratio: 4.4 ratio (ref 0.0–4.4)
Cholesterol, Total: 169 mg/dL (ref 100–199)
HDL: 38 mg/dL — ABNORMAL LOW (ref >39)
LDL Chol Calc (NIH): 102 mg/dL — ABNORMAL HIGH (ref 0–99)
Triglycerides: 163 mg/dL — ABNORMAL HIGH (ref 0–149)
VLDL Cholesterol Cal: 29 mg/dL (ref 5–40)

## 2022-02-05 LAB — HEMOGLOBIN A1C
Est. average glucose Bld gHb Est-mCnc: 103 mg/dL
Hgb A1c MFr Bld: 5.2 % (ref 4.8–5.6)

## 2022-02-06 ENCOUNTER — Other Ambulatory Visit: Payer: Self-pay | Admitting: Internal Medicine

## 2022-02-06 ENCOUNTER — Encounter: Payer: Self-pay | Admitting: Internal Medicine

## 2022-02-06 DIAGNOSIS — F419 Anxiety disorder, unspecified: Secondary | ICD-10-CM

## 2022-02-06 MED ORDER — HYDROXYZINE PAMOATE 25 MG PO CAPS
25.0000 mg | ORAL_CAPSULE | Freq: Three times a day (TID) | ORAL | 0 refills | Status: DC | PRN
Start: 2022-02-06 — End: 2022-02-26

## 2022-02-18 ENCOUNTER — Ambulatory Visit (INDEPENDENT_AMBULATORY_CARE_PROVIDER_SITE_OTHER): Payer: Self-pay | Admitting: Internal Medicine

## 2022-02-18 ENCOUNTER — Encounter: Payer: Self-pay | Admitting: Internal Medicine

## 2022-02-18 DIAGNOSIS — F419 Anxiety disorder, unspecified: Secondary | ICD-10-CM

## 2022-02-18 DIAGNOSIS — I10 Essential (primary) hypertension: Secondary | ICD-10-CM

## 2022-02-18 DIAGNOSIS — E559 Vitamin D deficiency, unspecified: Secondary | ICD-10-CM | POA: Insufficient documentation

## 2022-02-18 MED ORDER — LOSARTAN POTASSIUM 25 MG PO TABS: 25.0000 mg | ORAL_TABLET | Freq: Every day | ORAL | 2 refills | Status: DC

## 2022-02-18 NOTE — Assessment & Plan Note (Signed)
Total vitamin D level 20.5 on labs from last month.  She has started vitamin D 2000 units daily

## 2022-02-18 NOTE — Assessment & Plan Note (Signed)
Well-controlled with hydroxyzine as needed.  No changes today.

## 2022-02-18 NOTE — Patient Instructions (Signed)
It was a pleasure to see you today.  Thank you for giving [place] the opportunity to be involved in your care.  Below is a brief recap of your visit and next steps.  We will plan to see you again in 4 weeks.  Summary No changes today. Continue losartan and hydroxyzine.  Next steps Follow up in 4 weeks for BP check

## 2022-02-18 NOTE — Assessment & Plan Note (Signed)
BP 140/98 and 138/88 today.  Recently misplaced her losartan.  Readings at work remain elevated, but likely not accurate given the environment in which she is checking her blood pressure. -Losartan refill today -Plan for follow-up in 4 weeks for BP check and repeat BMP -I encouraged her to purchase a blood pressure cuff and to check her blood pressure at home in a more relaxed environment

## 2022-02-18 NOTE — Progress Notes (Signed)
Established Patient Office Visit  Subjective   Patient ID: Andrea Burns, female    DOB: Jul 04, 1985  Age: 36 y.o. MRN: [id number]  Chief Complaint  Patient presents with   Follow-up    2 week BP check   Andrea Burns is a 36 year old woman presenting today for follow-up.  She has a past medical history significant for hypertension, preeclampsia, anxiety, recent pancreatitis, and GERD.  Last seen by me on 8/28 to establish care.  At that time losartan 25 mg daily was started for HTN.  Returning today for BP check.  Today Andrea Burns states that she is feeling well.  She reports recently misplacing her losartan as she has young children at home who like to shake her pill bottles.  She is still looking for it but is concerned that she may not be able to find it, accordingly she has not taken her medication today.  BP initially 140/98, then 138/88. Andrea Burns has also been checking her blood pressure at work.  Her systolic readings at work have been in the 150s/160s, which is improved from 200s prior to starting losartan.  She states that she typically rushes into the break room to check her pressure.  This is not a calm or relaxed environment.  She is often not seated either. [name] has been taking hydroxyzine as needed for anxiety relief, which she states has been effective.  She denies additional symptoms or acute concerns currently.  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   Medical history non-contributory    Pneumonia    2013   Vaginal Pap smear, abnormal    Past Surgical History:  Procedure Laterality Date   APPENDECTOMY     COLPOSCOPY W/ BIOPSY / CURETTAGE     IUD REMOVAL     LAPAROSCOPIC APPENDECTOMY N/A 10/18/2019   Procedure: APPENDECTOMY LAPAROSCOPIC;  Surgeon: Virl Cagey, MD;  Location: [facility];  Service: General;  Laterality: N/A;   LAPAROSCOPY ABDOMEN DIAGNOSTIC     Removal of migrated IUD    TUBAL LIGATION N/A 12/11/2020   Procedure: POST PARTUM TUBAL LIGATION;  Surgeon: Osborne Oman, MD;  Location: [facility];  Service: Gynecology;  Laterality: N/A;   Social History   Tobacco Use   Smoking status: Former    Packs/day: 0.25    Years: 1.00    Total pack years: 0.25    Types: Cigarettes    Quit date: 05/03/2005    Years since quitting: 16.8   Smokeless tobacco: Never  Vaping Use   Vaping Use: Never used  Substance Use Topics   Alcohol use: Yes    Comment: Occass   Drug use: Not Currently    Types: Cocaine    Comment: last used August 2021   Family History  Problem Relation Age of Onset   Diabetes Mother    Hypertension Mother    Cancer Mother    Diabetes Father    Cancer Paternal Grandmother        liver & lung   No Known Allergies    Review of Systems  Constitutional:  Negative for chills and fever.  HENT:  Negative for sore throat.   Respiratory:  Negative for cough and shortness of breath.  Cardiovascular:  Negative for chest pain, palpitations and leg swelling.  Gastrointestinal:  Negative for abdominal pain, blood in stool, constipation, diarrhea, nausea and vomiting.  Genitourinary:  Negative for dysuria and hematuria.  Musculoskeletal:  Negative for myalgias.  Skin:  Negative for itching and rash.  Neurological:  Positive for headaches. Negative for dizziness.  Psychiatric/Behavioral:  Negative for depression and suicidal ideas.      Objective:     BP 138/88 (BP Location: Left Arm, Cuff Size: Normal)   Pulse 88   Ht 5' 2"  (1.575 m)   Wt 170 lb 6.4 oz (77.3 kg)   SpO2 99%   BMI 31.17 kg/m  BP Readings from Last 3 Encounters:  02/18/22 138/88  02/04/22 132/84  01/28/22 104/61      Physical Exam Vitals reviewed.  Constitutional:      General: She is not in acute distress.    Appearance: Normal  appearance. She is not toxic-appearing.  HENT:     Head: Normocephalic and atraumatic.     Mouth/Throat:     Mouth: Mucous membranes are moist.     Pharynx: Oropharynx is clear. No oropharyngeal exudate or posterior oropharyngeal erythema.  Eyes:     General: No scleral icterus.    Conjunctiva/sclera: Conjunctivae normal.     Pupils: Pupils are equal, round, and reactive to light.  Cardiovascular:     Rate and Rhythm: Normal rate and regular rhythm.     Pulses: Normal pulses.     Heart sounds: Normal heart sounds. No murmur heard.    No friction rub. No gallop.  Pulmonary:     Effort: Pulmonary effort is normal. No respiratory distress.     Breath sounds: Normal breath sounds. No wheezing, rhonchi or rales.  Abdominal:     General: Abdomen is flat. Bowel sounds are normal. There is no distension.     Palpations: Abdomen is soft.     Tenderness: There is no abdominal tenderness.  Musculoskeletal:        General: No swelling. Normal range of motion.     Right lower leg: No edema.     Left lower leg: No edema.  Skin:    General: Skin is warm and dry.     Coloration: Skin is not jaundiced.  Neurological:     General: No focal deficit present.     Mental Status: She is alert and oriented to person, place, and time.  Psychiatric:        Mood and Affect: Mood normal.        Behavior: Behavior normal.    No results found for any visits on 02/18/22.  Last CBC Lab Results  Component Value Date   WBC 9.1 09/15/2021   HGB 12.9 09/15/2021   HCT 38.2 09/15/2021   MCV 88.4 09/15/2021   MCH 29.9 09/15/2021   RDW 12.0 09/15/2021   PLT 254 57/84/6962   Last metabolic panel Lab Results  Component Value Date   GLUCOSE 86 02/04/2022   NA 139 02/04/2022   K 4.1 02/04/2022   CL 103 02/04/2022   CO2 24 02/04/2022   BUN 11 02/04/2022   CREATININE 0.92 02/04/2022   EGFR 83 02/04/2022   CALCIUM 9.6 02/04/2022   PROT 6.9 02/04/2022   ALBUMIN 4.5 02/04/2022   LABGLOB 2.4 02/04/2022    AGRATIO 1.9 02/04/2022   BILITOT 0.4 02/04/2022   ALKPHOS 75 02/04/2022   AST 24 02/04/2022   ALT 22 02/04/2022   ANIONGAP 5 09/15/2021  Last lipids Lab Results  Component Value Date   CHOL 169 02/04/2022   HDL 38 (L) 02/04/2022   LDLCALC 102 (H) 02/04/2022   TRIG 163 (H) 02/04/2022   CHOLHDL 4.4 02/04/2022   Last hemoglobin A1c Lab Results  Component Value Date   HGBA1C 5.2 02/04/2022   Last thyroid functions Lab Results  Component Value Date   TSH 2.020 10/06/2020   T4TOTAL 8.4 10/06/2020   Last vitamin D Lab Results  Component Value Date   VD25OH 20.5 (L) 02/04/2022   Last vitamin B12 and Folate Lab Results  Component Value Date   WIOXBDZH29 924 10/06/2020    The ASCVD Risk score (Arnett DK, et al., 2019) failed to calculate for the following reasons:   The 2019 ASCVD risk score is only valid for ages 43 to 1    Assessment & Plan:   Problem List Items Addressed This Visit       Cardiovascular and Mediastinum   Essential hypertension    BP 140/98 and 138/88 today.  Recently misplaced her losartan.  Readings at work remain elevated, but likely not accurate given the environment in which she is checking her blood pressure. -Losartan refill today -Plan for follow-up in 4 weeks for BP check and repeat BMP -I encouraged her to purchase a blood pressure cuff and to check her blood pressure at home in a more relaxed environment      Relevant Medications   losartan (COZAAR) 25 MG tablet     Other   Anxiety    Well-controlled with hydroxyzine as needed.  No changes today.      Vitamin D deficiency    Total vitamin D level 20.5 on labs from last month.  She has started vitamin D 2000 units daily       Return in about 4 weeks (around 03/18/2022).    Johnette Abraham, MD

## 2022-02-25 ENCOUNTER — Other Ambulatory Visit: Payer: Self-pay | Admitting: Internal Medicine

## 2022-02-25 DIAGNOSIS — F419 Anxiety disorder, unspecified: Secondary | ICD-10-CM

## 2022-03-18 ENCOUNTER — Ambulatory Visit: Payer: Self-pay | Admitting: Internal Medicine

## 2022-04-18 ENCOUNTER — Ambulatory Visit: Payer: Self-pay | Admitting: Internal Medicine

## 2022-05-03 ENCOUNTER — Other Ambulatory Visit: Payer: Self-pay | Admitting: Internal Medicine

## 2022-05-03 DIAGNOSIS — I10 Essential (primary) hypertension: Secondary | ICD-10-CM

## 2022-05-17 ENCOUNTER — Other Ambulatory Visit: Payer: Self-pay

## 2022-05-17 ENCOUNTER — Ambulatory Visit
Admission: EM | Admit: 2022-05-17 | Discharge: 2022-05-17 | Disposition: A | Discharge status: Home / Self Care | Payer: Self-pay | Attending: Family Medicine | Admitting: Family Medicine

## 2022-05-17 ENCOUNTER — Encounter: Payer: Self-pay | Admitting: Emergency Medicine

## 2022-05-17 DIAGNOSIS — R059 Cough, unspecified: Secondary | ICD-10-CM | POA: Insufficient documentation

## 2022-05-17 DIAGNOSIS — J45909 Unspecified asthma, uncomplicated: Secondary | ICD-10-CM | POA: Insufficient documentation

## 2022-05-17 DIAGNOSIS — Z1152 Encounter for screening for COVID-19: Secondary | ICD-10-CM | POA: Insufficient documentation

## 2022-05-17 DIAGNOSIS — J069 Acute upper respiratory infection, unspecified: Secondary | ICD-10-CM | POA: Insufficient documentation

## 2022-05-17 DIAGNOSIS — R030 Elevated blood-pressure reading, without diagnosis of hypertension: Secondary | ICD-10-CM | POA: Insufficient documentation

## 2022-05-17 HISTORY — DX: Restless legs syndrome: G25.81

## 2022-05-17 MED ORDER — FLUTICASONE PROPIONATE 50 MCG/ACT NA SUSP: 1.0000 | Freq: Two times a day (BID) | NASAL | 2 refills | Status: DC

## 2022-05-17 MED ORDER — PROMETHAZINE-DM 6.25-15 MG/5ML PO SYRP: 5.0000 mL | ORAL_SOLUTION | Freq: Four times a day (QID) | ORAL | 0 refills | Status: DC | PRN

## 2022-05-17 NOTE — ED Triage Notes (Signed)
Pt reports generalized body aches, urinary frequency for last several days. Pt also reports cough and head congestion,headache.

## 2022-05-18 LAB — RESP PANEL BY RT-PCR (FLU A&B, COVID) ARPGX2
Influenza A by PCR: NEGATIVE
Influenza B by PCR: NEGATIVE
SARS Coronavirus 2 by RT PCR: NEGATIVE

## 2022-05-20 NOTE — ED Provider Notes (Signed)
[facility]    CSN: [id number] Arrival date & time: 05/17/22  1846      History   Chief Complaint Chief Complaint  Patient presents with   Generalized Body Aches    HPI Andrea Burns is a 36 y.o. female.   Patient presenting today with several day history of generalized body aches, cough, headache, nasal congestion.  Denies chest pain, shortness of breath, abdominal pain, nausea vomiting or diarrhea.  So far not tried anything over-the-counter for symptoms.  Multiple sick contacts recently.  History of asthma on albuterol as needed.    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   Medical history non-contributory    Pneumonia    2013   Restless leg    Vaginal Pap smear, abnormal     Patient Active Problem List   Diagnosis Date Noted   Vitamin D deficiency 02/18/2022   Essential hypertension 02/04/2022   Anxiety 02/04/2022   GERD (gastroesophageal reflux disease) 02/04/2022   History of acute pancreatitis 02/04/2022   BMI 32.0-32.9,adult 02/04/2022   Preventative health care 02/04/2022   Elevated blood pressure affecting pregnancy in first trimester, antepartum 08/20/2021   Leg pain 08/20/2021   Gallstones without obstruction of gallbladder 01/22/2021   Status post bilateral salpingectomy 12/11/2020   Chronic migraine w/o aura w/o status migrainosus, not intractable 10/06/2020   History of substance abuse (HCC) 05/23/2020    Past Surgical History:  Procedure Laterality Date   APPENDECTOMY     COLPOSCOPY W/ BIOPSY / CURETTAGE     IUD REMOVAL     LAPAROSCOPIC APPENDECTOMY N/A 10/18/2019   Procedure: APPENDECTOMY LAPAROSCOPIC;  Surgeon: [name], MD;  Location: [facility];  Service: General;  Laterality: N/A;   LAPAROSCOPY ABDOMEN DIAGNOSTIC      Removal of migrated IUD    TUBAL LIGATION N/A 12/11/2020   Procedure: POST PARTUM TUBAL LIGATION;  Surgeon: Tereso Newcomer, MD;  Location: [facility];  Service: Gynecology;  Laterality: N/A;    OB History     Gravida  6   Para  5   Term  5   Preterm      AB  1   Living  5      SAB  1   IAB      Ectopic      Multiple  0   Live Births  5            Home Medications    Prior to Admission medications   Medication Sig Start Date End Date Taking? Authorizing Provider  fluticasone (FLONASE) 50 MCG/ACT nasal spray Place 1 spray into both nostrils 2 (two) times daily. 05/17/22  Yes Particia Nearing, PA-C  promethazine-dextromethorphan (PROMETHAZINE-DM) 6.25-15 MG/5ML syrup Take 5 mLs by mouth 4 (four) times daily as needed. 05/17/22  Yes Particia Nearing, PA-C  albuterol (VENTOLIN HFA) 108 (90 Base) MCG/ACT inhaler Inhale 2 puffs into the lungs every 6 (six) hours as needed for wheezing or shortness of breath.    [provider]  hydrOXYzine (VISTARIL) 25 MG capsule TAKE 1 CAPSULE(25 MG) BY MOUTH EVERY 8 HOURS AS NEEDED FOR ANXIETY 02/26/22   Billie Lade, MD  losartan (COZAAR) 25 MG tablet TAKE 1 TABLET(25  MG) BY MOUTH DAILY 05/06/22   Billie Lade, MD    Family History Family History  Problem Relation Age of Onset   Diabetes Mother    Hypertension Mother    Cancer Mother    Diabetes Father    Cancer Paternal Grandmother        liver & lung    Social History Social History   Tobacco Use   Smoking status: Former    Packs/day: 0.25    Years: 1.00    Total pack years: 0.25    Types: Cigarettes    Quit date: 05/03/2005    Years since quitting: 17.0   Smokeless tobacco: Never  Vaping Use   Vaping Use: Never used  Substance Use Topics   Alcohol use: Not Currently    Comment: Occ   Drug use: Not Currently    Types: Cocaine    Comment: last used August 2021     Allergies   Patient has no known allergies.   Review of  Systems Review of Systems Per HPI  Physical Exam Triage Vital Signs ED Triage Vitals  Enc Vitals Group     BP 05/17/22 1931 (!) 185/127     Pulse Rate 05/17/22 1931 73     Resp 05/17/22 1931 20     Temp 05/17/22 1931 98.1 F (36.7 C)     Temp Source 05/17/22 1931 Oral     SpO2 05/17/22 1931 98 %     Weight --      Height --      Head Circumference --      Peak Flow --      Pain Score 05/17/22 1932 8     Pain Loc --      Pain Edu? --      Excl. in GC? --    No data found.  Updated Vital Signs BP (!) 185/127 (BP Location: Right Arm) Comment: last dose of bp medication yesterday.  Pulse 73   Temp 98.1 F (36.7 C) (Oral)   Resp 20   LMP 05/10/2022 (Approximate) Comment: reports has been on a period for last several weeks.  SpO2 98%   Breastfeeding No   Visual Acuity Right Eye Distance:   Left Eye Distance:   Bilateral Distance:    Right Eye Near:   Left Eye Near:    Bilateral Near:     Physical Exam Vitals and nursing note reviewed.  Constitutional:      Appearance: Normal appearance.  HENT:     Head: Atraumatic.     Right Ear: Tympanic membrane and external ear normal.     Left Ear: Tympanic membrane and external ear normal.     Nose: Rhinorrhea present.     Mouth/Throat:     Mouth: Mucous membranes are moist.     Pharynx: Posterior oropharyngeal erythema present.  Eyes:     Extraocular Movements: Extraocular movements intact.     Conjunctiva/sclera: Conjunctivae normal.  Cardiovascular:     Rate and Rhythm: Normal rate and regular rhythm.     Heart sounds: Normal heart sounds.  Pulmonary:     Effort: Pulmonary effort is normal.     Breath sounds: Normal breath sounds. No wheezing.  Musculoskeletal:        General: Normal range of motion.     Cervical back: Normal range of motion and neck supple.  Skin:    General: Skin is warm and dry.  Neurological:     Mental Status: She is  alert and oriented to person, place, and time.  Psychiatric:         Mood and Affect: Mood normal.        Thought Content: Thought content normal.      UC Treatments / Results  Labs (all labs ordered are listed, but only abnormal results are displayed) Labs Reviewed  RESP PANEL BY RT-PCR (FLU A&B, COVID) [id number]    EKG   Radiology No results found.  Procedures Procedures (including critical care time)  Medications Ordered in UC Medications - No data to display  Initial Impression / Assessment and Plan / UC Course  I have reviewed the triage vital signs and the nursing notes.  Pertinent labs & imaging results that were available during my care of the patient were reviewed by me and considered in my medical decision making (see chart for details).     Significantly hypertensive in triage, when asked about this she states that she has not been taking her blood pressure medicine, has refills available at the pharmacy and needs to go get them.  She denies any chest pain, shortness of breath today.  Suspect her symptoms are related to a viral upper respiratory infection, respiratory panel pending, treat with Phenergan DM, Flonase, supportive over-the-counter medications and home care.  Return for worsening symptoms.  Final Clinical Impressions(s) / UC Diagnoses   Final diagnoses:  Viral URI with cough  Elevated blood pressure reading   Discharge Instructions   None    ED Prescriptions     Medication Sig Dispense Auth. Provider   promethazine-dextromethorphan (PROMETHAZINE-DM) 6.25-15 MG/5ML syrup Take 5 mLs by mouth 4 (four) times daily as needed. 100 mL Particia Nearing, PA-C   fluticasone [facility]) 50 MCG/ACT nasal spray Place 1 spray into both nostrils 2 (two) times daily. 16 g Particia Nearing, [place]      PDMP not reviewed this encounter.   Particia Nearing, [place] 05/20/22 1859

## 2022-08-01 ENCOUNTER — Ambulatory Visit (INDEPENDENT_AMBULATORY_CARE_PROVIDER_SITE_OTHER): Payer: Self-pay | Admitting: Obstetrics

## 2022-08-01 ENCOUNTER — Other Ambulatory Visit (HOSPITAL_COMMUNITY)
Admission: RE | Admit: 2022-08-01 | Discharge: 2022-08-01 | Disposition: A | Discharge status: Home / Self Care | Payer: Self-pay | Source: Ambulatory Visit | Attending: Obstetrics | Admitting: Obstetrics

## 2022-08-01 ENCOUNTER — Encounter: Payer: Self-pay | Admitting: Obstetrics

## 2022-08-01 VITALS — BP 158/102 | HR 70 | Ht 62.0 in | Wt 164.0 lb

## 2022-08-01 DIAGNOSIS — Z113 Encounter for screening for infections with a predominantly sexual mode of transmission: Secondary | ICD-10-CM

## 2022-08-01 DIAGNOSIS — R300 Dysuria: Secondary | ICD-10-CM

## 2022-08-01 DIAGNOSIS — I10 Essential (primary) hypertension: Secondary | ICD-10-CM

## 2022-08-01 DIAGNOSIS — N72 Inflammatory disease of cervix uteri: Secondary | ICD-10-CM

## 2022-08-01 DIAGNOSIS — R52 Pain, unspecified: Secondary | ICD-10-CM

## 2022-08-01 DIAGNOSIS — N898 Other specified noninflammatory disorders of vagina: Secondary | ICD-10-CM | POA: Insufficient documentation

## 2022-08-01 DIAGNOSIS — Z01419 Encounter for gynecological examination (general) (routine) without abnormal findings: Secondary | ICD-10-CM

## 2022-08-01 LAB — POCT URINALYSIS DIPSTICK
Bilirubin, UA: POSITIVE
Blood, UA: POSITIVE
Glucose, UA: NEGATIVE
Ketones, UA: POSITIVE
Nitrite, UA: NEGATIVE
Protein, UA: POSITIVE — AB
Spec Grav, UA: >=1.03 — AB (ref 1.010–1.025)
Urobilinogen, UA: 0.2 E.U./dL
pH, UA: 6.5 (ref 5.0–8.0)

## 2022-08-01 MED ORDER — IBUPROFEN 800 MG PO TABS: 800.0000 mg | ORAL_TABLET | Freq: Three times a day (TID) | ORAL | 5 refills | Status: DC | PRN

## 2022-08-01 MED ORDER — METRONIDAZOLE 500 MG PO TABS: 500.0000 mg | ORAL_TABLET | Freq: Two times a day (BID) | ORAL | 2 refills | Status: DC

## 2022-08-01 MED ORDER — DOXYCYCLINE HYCLATE 100 MG PO CAPS: 100.0000 mg | ORAL_CAPSULE | Freq: Two times a day (BID) | ORAL | 0 refills | Status: DC

## 2022-08-01 NOTE — Progress Notes (Addendum)
37 y.o GYN presents for AEX/PAP/STD screening. C/o abdominal, pelvic and back pain 9/10 x 1 year , vaginal odor, lack of libido, moodiness also dizziness, headaches; Pt stated that she has taken any BP medication in 1 week because she is out of Rx.

## 2022-08-01 NOTE — Progress Notes (Signed)
Subjective:        Andrea Burns is a 37 y.o. female here for a routine exam.  Current complaints: Vaginal discharge and vaginal bleeding with intercourse.  Also c/o urinary frequency, nocturia and backache.  Complains of decreased sexual desire.  Personal health questionnaire:  Is patient [name], have a family history of breast and/or ovarian cancer: no Is there a family history of uterine cancer diagnosed at age < 75, gastrointestinal cancer, urinary tract cancer, family member who is a Field seismologist syndrome-associated carrier: no Is the patient overweight and hypertensive, family history of diabetes, personal history of gestational diabetes, preeclampsia or PCOS: no Is patient over 65, have PCOS,  family history of premature CHD under age 89, diabetes, smoke, have hypertension or peripheral artery disease:  no At any time, has a partner hit, kicked or otherwise hurt or frightened you?: no Over the past 2 weeks, have you felt down, depressed or hopeless?: no Over the past 2 weeks, have you felt little interest or pleasure in doing things?:no   Gynecologic History Patient's last menstrual period was 07/25/2022. Contraception: tubal ligation Last Pap: 2020. Results were: normal Last mammogram: n/a. Results were: n/a  Obstetric History OB History  Gravida Para Term Preterm AB Living  6 5 5   1 5  $ SAB IAB Ectopic Multiple Live Births  1     0 5    # Outcome Date GA Lbr Len/2nd Weight Sex Delivery Anes PTL Lv  6 Term 12/11/20 [id number] 6 lb 12.4 oz (3.073 kg) F Vag-Spont None  LIV  5 Term 05/10/13 320w3d5:30 / 00:36 6 lb 15.3 oz (3.155 kg) M Vag-Spont Local  LIV  4 Term 04/23/09 4074w0d lb (3.629 kg) M Vag-Spont None  LIV     Birth Comments: No complications  3 Term [id number] 5 oz (3.317 kg) M Vag-Spont None  LIV     Birth Comments: No complications  2 Term [id number] 11 oz (3.487 kg) F Vag-Spont None  LIV     Birth Comments: No complications  1  SAB             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   Medical history non-contributory    Pneumonia    2013   Restless leg    Vaginal Pap smear, abnormal     Past Surgical History:  Procedure Laterality Date   APPENDECTOMY     COLPOSCOPY W/ BIOPSY / CURETTAGE     IUD REMOVAL     LAPAROSCOPIC APPENDECTOMY N/A 10/18/2019   Procedure: APPENDECTOMY LAPAROSCOPIC;  Surgeon: BridgVirl Cagey  Location: [facility];  Service: General;  Laterality: N/A;   LAPAROSCOPY ABDOMEN DIAGNOSTIC     Removal of migrated IUD    TUBAL LIGATION N/A 12/11/2020   Procedure: POST PARTUM TUBAL LIGATION;  Surgeon: AnyanOsborne Oman  Location: [facility];  Service: Gynecology;  Laterality: N/A;     Current Outpatient Medications:    doxycycline (VIBRAMYCIN) 100 MG capsule, Take 1 capsule (100 mg total) by mouth 2 (two) times daily., Disp: 28 capsule, Rfl: 0   ibuprofen (ADVIL) 800 MG tablet, Take 1 tablet (800 mg total)  by mouth every 8 (eight) hours as needed., Disp: 30 tablet, Rfl: 5   metroNIDAZOLE (FLAGYL) 500 MG tablet, Take 1 tablet (500 mg total) by mouth 2 (two) times daily., Disp: 28 tablet, Rfl: 2   albuterol (VENTOLIN HFA) 108 (90 Base) MCG/ACT inhaler, Inhale 2 puffs into the lungs every 6 (six) hours as needed for wheezing or shortness of breath., Disp: , Rfl:    fluticasone (FLONASE) 50 MCG/ACT nasal spray, Place 1 spray into both nostrils 2 (two) times daily., Disp: 16 g, Rfl: 2   hydrOXYzine (VISTARIL) 25 MG capsule, TAKE 1 CAPSULE(25 MG) BY MOUTH EVERY 8 HOURS AS NEEDED FOR ANXIETY, Disp: 30 capsule, Rfl: 0   losartan (COZAAR) 25 MG tablet, TAKE 1 TABLET(25 MG) BY MOUTH DAILY, Disp: 30 tablet, Rfl: 2   promethazine-dextromethorphan (PROMETHAZINE-DM) 6.25-15 MG/5ML syrup, Take 5 mLs by mouth 4  (four) times daily as needed., Disp: 100 mL, Rfl: 0 No Known Allergies  Social History   Tobacco Use   Smoking status: Former    Packs/day: 0.25    Years: 1.00    Total pack years: 0.25    Types: Cigarettes    Quit date: 05/03/2005    Years since quitting: 17.2   Smokeless tobacco: Never  Substance Use Topics   Alcohol use: Yes    Comment: Occ    Family History  Problem Relation Age of Onset   Diabetes Mother    Hypertension Mother    Cancer Mother    Diabetes Father    Cancer Paternal Grandmother        liver & lung      Review of Systems  Constitutional: negative for fatigue and weight loss Respiratory: negative for cough and wheezing Cardiovascular: negative for chest pain, fatigue and palpitations Gastrointestinal: positive for abdominal pain, GERD and constipation Musculoskeletal:negative for myalgias Neurological: negative for gait problems and tremors Behavioral/Psych: negative for abusive relationship, depression Endocrine: negative for temperature intolerance    Genitourinary: positive for vaginal discharge and postcoital vaginal bleeding, dysuria, and decreased libido.  negative for abnormal menstrual periods, genital lesions, hot flashes Integument/breast: negative for breast lump, breast tenderness, nipple discharge and skin lesion(s)    Objective:       BP (!) 158/102 (BP Location: Left Arm, Cuff Size: Large)   Pulse 70   Ht 5' 2"$  (1.575 m)   Wt 164 lb (74.4 kg)   LMP 07/25/2022   BMI 30.00 kg/m  General:   Alert and no distress  Skin:   no rash or abnormalities  Lungs:   clear to auscultation bilaterally  Heart:   regular rate and rhythm, S1, S2 normal, no murmur, click, rub or gallop  Breasts:   normal without suspicious masses, skin or nipple changes or axillary nodes  Abdomen:  normal findings: no organomegaly, soft, non-tender and no hernia  Pelvis:  External genitalia: normal general appearance Urinary system: urethral meatus normal and  bladder without fullness, nontender Vaginal: normal without tenderness, induration or masses Cervix: normal appearance Adnexa: normal bimanual exam Uterus: anteverted and non-tender, normal size   Lab Review Urine pregnancy test Labs reviewed yes Radiologic studies reviewed yes  I have spent a total of 20 minutes of face-to-face time, excluding clinical staff time, reviewing notes and preparing to see patient, ordering tests and/or medications, and counseling the patient.   Assessment:    1. Encounter for gynecological examination with Papanicolaou smear of cervix Rx: - Cytology - PAP( [place])  2. Cervicitis Rx: - metroNIDAZOLE (  FLAGYL) 500 MG tablet; Take 1 tablet (500 mg total) by mouth 2 (two) times daily.  Dispense: 28 tablet; Refill: 2 - doxycycline (VIBRAMYCIN) 100 MG capsule; Take 1 capsule (100 mg total) by mouth 2 (two) times daily.  Dispense: 28 capsule; Refill: 0  3. Vaginal discharge Rx: - Cervicovaginal ancillary only( [facility])  4. Screen for STD (sexually transmitted disease) Rx: - HIV antibody (with reflex) - Hepatitis C Antibody - Hepatitis B Surface AntiGEN - RPR  5. Dysuria Rx: - POCT Urinalysis Dipstick - Urine Culture  6. Pain Rx: - ibuprofen (ADVIL) 800 MG tablet; Take 1 tablet (800 mg total) by mouth every 8 (eight) hours as needed.  Dispense: 30 tablet; Refill: 5  7. HTN (hypertension), benign Rx: - Ambulatory referral to Internal Medicine    Plan:    Education reviewed: calcium supplements, depression evaluation, low fat, low cholesterol diet, safe sex/STD prevention, self breast exams, and weight bearing exercise. Follow up in: 3 months.   Meds ordered this encounter  Medications   metroNIDAZOLE (FLAGYL) 500 MG tablet    Sig: Take 1 tablet (500 mg total) by mouth 2 (two) times daily.    Dispense:  28 tablet    Refill:  2   doxycycline (VIBRAMYCIN) 100 MG capsule    Sig: Take 1 capsule (100 mg total) by mouth 2 (two) times  daily.    Dispense:  28 capsule    Refill:  0   ibuprofen (ADVIL) 800 MG tablet    Sig: Take 1 tablet (800 mg total) by mouth every 8 (eight) hours as needed.    Dispense:  30 tablet    Refill:  5   Orders Placed This Encounter  Procedures   Urine Culture   HIV antibody (with reflex)   Hepatitis C Antibody   Hepatitis B Surface AntiGEN   RPR   Ambulatory referral to Internal Medicine    Referral Priority:   Routine    Referral Type:   Consultation    Referral Reason:   Specialty Services Required    Requested Specialty:   Internal Medicine    Number of Visits Requested:   1   POCT Urinalysis Dipstick     Shelly Bombard, MD 08/01/2022 1:24 PM

## 2022-08-02 LAB — CERVICOVAGINAL ANCILLARY ONLY
Bacterial Vaginitis (gardnerella): NEGATIVE
Candida Glabrata: NEGATIVE
Candida Vaginitis: NEGATIVE
Chlamydia: NEGATIVE
Comment: NEGATIVE
Comment: NEGATIVE
Comment: NEGATIVE
Comment: NEGATIVE
Comment: NEGATIVE
Comment: NORMAL
Neisseria Gonorrhea: NEGATIVE
Trichomonas: NEGATIVE

## 2022-08-02 LAB — HIV ANTIBODY (ROUTINE TESTING W REFLEX): HIV Screen 4th Generation wRfx: NONREACTIVE

## 2022-08-02 LAB — RPR: RPR Ser Ql: NONREACTIVE

## 2022-08-02 LAB — HEPATITIS C ANTIBODY: Hep C Virus Ab: NONREACTIVE

## 2022-08-02 LAB — HEPATITIS B SURFACE ANTIGEN: Hepatitis B Surface Ag: NEGATIVE

## 2022-08-03 LAB — URINE CULTURE

## 2022-08-06 LAB — CYTOLOGY - PAP
Comment: NEGATIVE
Diagnosis: NEGATIVE
High risk HPV: NEGATIVE

## 2022-08-16 ENCOUNTER — Ambulatory Visit (INDEPENDENT_AMBULATORY_CARE_PROVIDER_SITE_OTHER): Payer: Self-pay | Admitting: Internal Medicine

## 2022-08-16 ENCOUNTER — Encounter: Payer: Self-pay | Admitting: Internal Medicine

## 2022-08-16 ENCOUNTER — Encounter: Payer: Self-pay | Admitting: *Deleted

## 2022-08-16 VITALS — BP 156/103 | HR 76 | Ht 62.0 in | Wt 166.6 lb

## 2022-08-16 DIAGNOSIS — R1013 Epigastric pain: Secondary | ICD-10-CM

## 2022-08-16 DIAGNOSIS — E559 Vitamin D deficiency, unspecified: Secondary | ICD-10-CM

## 2022-08-16 DIAGNOSIS — Z8719 Personal history of other diseases of the digestive system: Secondary | ICD-10-CM

## 2022-08-16 DIAGNOSIS — R300 Dysuria: Secondary | ICD-10-CM

## 2022-08-16 DIAGNOSIS — K219 Gastro-esophageal reflux disease without esophagitis: Secondary | ICD-10-CM

## 2022-08-16 DIAGNOSIS — I10 Essential (primary) hypertension: Secondary | ICD-10-CM

## 2022-08-16 DIAGNOSIS — N3000 Acute cystitis without hematuria: Secondary | ICD-10-CM | POA: Insufficient documentation

## 2022-08-16 LAB — POCT URINALYSIS DIP (CLINITEK)
Bilirubin, UA: NEGATIVE
Blood, UA: NEGATIVE
Glucose, UA: NEGATIVE mg/dL
Ketones, POC UA: NEGATIVE mg/dL
Nitrite, UA: NEGATIVE
POC PROTEIN,UA: NEGATIVE
Spec Grav, UA: 1.02 (ref 1.010–1.025)
Urobilinogen, UA: 0.2 E.U./dL
pH, UA: 7 (ref 5.0–8.0)

## 2022-08-16 MED ORDER — CEPHALEXIN 500 MG PO CAPS: 500.0000 mg | ORAL_CAPSULE | Freq: Two times a day (BID) | ORAL | 0 refills | Status: AC

## 2022-08-16 MED ORDER — PANTOPRAZOLE SODIUM 40 MG PO TBEC: 40.0000 mg | DELAYED_RELEASE_TABLET | Freq: Every day | ORAL | 2 refills | Status: DC

## 2022-08-16 MED ORDER — AMLODIPINE BESYLATE 5 MG PO TABS: 5.0000 mg | ORAL_TABLET | Freq: Every day | ORAL | 2 refills | Status: DC

## 2022-08-16 MED ORDER — LOSARTAN POTASSIUM 50 MG PO TABS: 50.0000 mg | ORAL_TABLET | Freq: Every day | ORAL | 2 refills | Status: DC

## 2022-08-16 NOTE — Assessment & Plan Note (Signed)
She is currently prescribed losartan 25 mg daily for treatment of hypertension.  Her blood pressure is elevated today, 158/109 initially and 156/103 on repeat. -Increase losartan to 50 mg daily -Add amlodipine 5 mg daily -Follow-up in 4 weeks for HTN check

## 2022-08-16 NOTE — Assessment & Plan Note (Signed)
She endorses increased urinary frequency and suprapubic discomfort.  UA notable for moderate leukocyte esterase.  She has recently completed treatment with metronidazole for cervicitis. -Keflex x 5 days prescribed for treatment of UTI -Follow-up urine culture

## 2022-08-16 NOTE — Assessment & Plan Note (Signed)
She endorses a history of persistent epigastric abdominal pain.  Pain is exacerbated with with eating.  As a result she has limited her oral intake and has been losing weight as a result.  She endorses symptoms that are also worse when lying down.  There is mild tenderness palpation epigastric region on exam. -Start Protonix 40 mg daily -I have placed referral to gastroenterology at her request -Checking basic labs, including lipase today

## 2022-08-16 NOTE — Patient Instructions (Signed)
It was a pleasure to see you today.  Thank you for giving [place] the opportunity to be involved in your care.  Below is a brief recap of your visit and next steps.  We will plan to see you again in 4 weeks.  Summary Increase losartan to 50 mg daily Add amlodipine 5 mg daily Start protonix 40 mg daily for stomach pain I have placed a referral to GI We will check labs and plan for follow up in 4 weeks

## 2022-08-16 NOTE — Progress Notes (Signed)
Acute Office Visit  Subjective:     Patient ID: Andrea Burns, female    DOB: 28-Nov-1985, 37 y.o.   MRN: [id number]  Chief Complaint  Patient presents with   Referral    GI stomach issues when eating    Andrea Burns presents today for an acute visit with multiple concerns.  She endorses persistent epigastric abdominal pain.  Her pain is worse after eating.  She has limited the amount of food she eats because it exacerbates her pain.  She has lost weight recently has a result.  She is interested in referral to gastroenterology for further evaluation.  Her medical history is significant for pancreatitis.  She states that symptoms are worse when laying flat at night.  She additionally endorses uncontrolled hypertension recently.  Her blood pressure today is elevated.  She has been taking losartan 25 mg daily as prescribed. Andrea Burns additionally endorses increased urinary frequency.  She has recently been seen by her gynecologist and was prescribed metronidazole and doxycycline for treatment of cervicitis.  She was not able to fill the prescription for doxycycline but completed metronidazole.  STI screening was negative.  She endorses increased urinary frequency and suprapubic discomfort.  She denies fever/chills, hematuria, dysuria, and foul odor/discoloration of urine.  Review of Systems  Constitutional:  Negative for chills, fever and malaise/fatigue.  Gastrointestinal:  Positive for abdominal pain. Negative for blood in stool, constipation, diarrhea, melena, nausea and vomiting.  Genitourinary:  Positive for frequency. Negative for dysuria, flank pain, hematuria and urgency.  All other systems reviewed and are negative.     Objective:    BP (!) 156/103   Pulse 76   Ht '5\' 2"'$  (1.575 m)   Wt 166 lb 9.6 oz (75.6 kg)   LMP 07/25/2022   SpO2 96%   BMI 30.47 kg/m   Physical Exam Vitals reviewed.  Constitutional:      General: She is not in acute distress.    Appearance: Normal  appearance. She is not toxic-appearing.  HENT:     Head: Normocephalic and atraumatic.     Right Ear: External ear normal.     Left Ear: External ear normal.     Nose: Nose normal. No congestion or rhinorrhea.     Mouth/Throat:     Mouth: Mucous membranes are moist.     Pharynx: Oropharynx is clear. No oropharyngeal exudate or posterior oropharyngeal erythema.  Eyes:     General: No scleral icterus.    Extraocular Movements: Extraocular movements intact.     Conjunctiva/sclera: Conjunctivae normal.     Pupils: Pupils are equal, round, and reactive to light.  Cardiovascular:     Rate and Rhythm: Normal rate and regular rhythm.     Pulses: Normal pulses.     Heart sounds: Normal heart sounds. No murmur heard.    No friction rub. No gallop.  Pulmonary:     Effort: Pulmonary effort is normal.     Breath sounds: Normal breath sounds. No wheezing, rhonchi or rales.  Abdominal:     General: Abdomen is flat. Bowel sounds are normal. There is no distension.     Palpations: Abdomen is soft. There is no mass.     Tenderness: There is abdominal tenderness (TTP across lower abdominal quadrants). There is no right CVA tenderness, left CVA tenderness, guarding or rebound.  Musculoskeletal:        General: No swelling. Normal range of motion.     Cervical back: Normal range of motion.  Right lower leg: No edema.     Left lower leg: No edema.  Lymphadenopathy:     Cervical: No cervical adenopathy.  Skin:    General: Skin is warm and dry.     Capillary Refill: Capillary refill takes less than 2 seconds.     Coloration: Skin is not jaundiced.  Neurological:     General: No focal deficit present.     Mental Status: She is alert and oriented to person, place, and time.  Psychiatric:        Mood and Affect: Mood normal.        Behavior: Behavior normal.       Assessment & Plan:   Problem List Items Addressed This Visit       Essential hypertension - Primary    She is currently  prescribed losartan 25 mg daily for treatment of hypertension.  Her blood pressure is elevated today, 158/109 initially and 156/103 on repeat. -Increase losartan to 50 mg daily -Add amlodipine 5 mg daily -Follow-up in 4 weeks for HTN check      Acute cystitis without hematuria    She endorses increased urinary frequency and suprapubic discomfort.  UA notable for moderate leukocyte esterase.  She has recently completed treatment with metronidazole for cervicitis. -Keflex x 5 days prescribed for treatment of UTI -Follow-up urine culture      Epigastric abdominal pain    She endorses a history of persistent epigastric abdominal pain.  Pain is exacerbated with with eating.  As a result she has limited her oral intake and has been losing weight as a result.  She endorses symptoms that are also worse when lying down.  There is mild tenderness palpation epigastric region on exam. -Start Protonix 40 mg daily -I have placed referral to gastroenterology at her request -Checking basic labs, including lipase today      Meds ordered this encounter  Medications   losartan (COZAAR) 50 MG tablet    Sig: Take 1 tablet (50 mg total) by mouth daily.    Dispense:  30 tablet    Refill:  2   amLODipine (NORVASC) 5 MG tablet    Sig: Take 1 tablet (5 mg total) by mouth daily.    Dispense:  30 tablet    Refill:  2   pantoprazole (PROTONIX) 40 MG tablet    Sig: Take 1 tablet (40 mg total) by mouth daily.    Dispense:  30 tablet    Refill:  2   cephALEXin (KEFLEX) 500 MG capsule    Sig: Take 1 capsule (500 mg total) by mouth 2 (two) times daily for 5 days.    Dispense:  10 capsule    Refill:  0    Return in about 4 weeks (around 09/13/2022) for HTN, abdominal pain.  Johnette Abraham, MD

## 2022-08-17 LAB — CBC WITH DIFFERENTIAL/PLATELET
Basophils Absolute: 0 10*3/uL (ref 0.0–0.2)
Basos: 0 %
EOS (ABSOLUTE): 0.2 10*3/uL (ref 0.0–0.4)
Eos: 2 %
Hematocrit: 38.5 % (ref 34.0–46.6)
Hemoglobin: 12.8 g/dL (ref 11.1–15.9)
Immature Grans (Abs): 0 10*3/uL (ref 0.0–0.1)
Immature Granulocytes: 0 %
Lymphocytes Absolute: 2.4 10*3/uL (ref 0.7–3.1)
Lymphs: 26 %
MCH: 29.6 pg (ref 26.6–33.0)
MCHC: 33.2 g/dL (ref 31.5–35.7)
MCV: 89 fL (ref 79–97)
Monocytes Absolute: 0.6 10*3/uL (ref 0.1–0.9)
Monocytes: 6 %
Neutrophils Absolute: 6.1 10*3/uL (ref 1.4–7.0)
Neutrophils: 66 %
Platelets: 271 10*3/uL (ref 150–450)
RBC: 4.33 x10E6/uL (ref 3.77–5.28)
RDW: 11.9 % (ref 11.7–15.4)
WBC: 9.3 10*3/uL (ref 3.4–10.8)

## 2022-08-17 LAB — CMP14+EGFR
ALT: 18 IU/L (ref 0–32)
AST: 18 IU/L (ref 0–40)
Albumin/Globulin Ratio: 1.6 (ref 1.2–2.2)
Albumin: 4.1 g/dL (ref 3.9–4.9)
Alkaline Phosphatase: 58 IU/L (ref 44–121)
BUN/Creatinine Ratio: 15 (ref 9–23)
BUN: 11 mg/dL (ref 6–20)
Bilirubin Total: 0.5 mg/dL (ref 0.0–1.2)
CO2: 23 mmol/L (ref 20–29)
Calcium: 9.2 mg/dL (ref 8.7–10.2)
Chloride: 103 mmol/L (ref 96–106)
Creatinine, Ser: 0.74 mg/dL (ref 0.57–1.00)
Globulin, Total: 2.5 g/dL (ref 1.5–4.5)
Glucose: 81 mg/dL (ref 70–99)
Potassium: 3.8 mmol/L (ref 3.5–5.2)
Sodium: 139 mmol/L (ref 134–144)
Total Protein: 6.6 g/dL (ref 6.0–8.5)
eGFR: 107 mL/min/{1.73_m2} (ref >59)

## 2022-08-17 LAB — LIPASE: Lipase: 43 U/L (ref 14–72)

## 2022-08-17 LAB — VITAMIN D 25 HYDROXY (VIT D DEFICIENCY, FRACTURES): Vit D, 25-Hydroxy: 22.4 ng/mL — ABNORMAL LOW (ref 30.0–100.0)

## 2022-08-21 LAB — UA/M W/RFLX CULTURE, ROUTINE

## 2022-09-11 ENCOUNTER — Encounter: Payer: Self-pay | Admitting: Internal Medicine

## 2022-09-11 ENCOUNTER — Ambulatory Visit: Payer: Medicaid Other | Admitting: Internal Medicine

## 2022-09-11 ENCOUNTER — Encounter (INDEPENDENT_AMBULATORY_CARE_PROVIDER_SITE_OTHER): Payer: Self-pay | Admitting: Internal Medicine

## 2022-09-11 VITALS — BP 139/86 | HR 70 | Temp 97.5°F | Ht 62.0 in | Wt 169.7 lb

## 2022-09-11 DIAGNOSIS — K219 Gastro-esophageal reflux disease without esophagitis: Secondary | ICD-10-CM

## 2022-09-11 DIAGNOSIS — R634 Abnormal weight loss: Secondary | ICD-10-CM

## 2022-09-11 DIAGNOSIS — R1013 Epigastric pain: Secondary | ICD-10-CM

## 2022-09-11 DIAGNOSIS — K5904 Chronic idiopathic constipation: Secondary | ICD-10-CM

## 2022-09-11 NOTE — H&P (View-Only) (Signed)
  Primary Care Physician:  Dixon, Phillip E, MD Primary Gastroenterologist:  Dr. Helix Lafontaine  Chief Complaint  Patient presents with   Consult    Patient here today due to issues with Gerd and mid epigastric pain. Patient says this keeps her up most night. Patient says she takes pantoprazole 40 mg once per day which at times controls symptoms and at others does not. Patient had a recent Ed visit on 01/29/2022 at [facility] and was told she had acute pancreatitis. Patient says pcp told her she did not have any issues with her pancreas.     HPI:   Andrea Burns is a 37 y.o. female who presents to the clinic today by referral from her PCP Dr. Dixon for evaluation.  Multiple GI complaints for me today.  Notes chronic epigastric pain.  Symptoms intermittent happening multiple times weekly.  Sometimes severe.  Eating makes it worse.  She has lost weight as she has been scared to eat due to her symptoms.  169 pounds today.  Previously weighed more than 190 pounds last year.  Diagnosed with pancreatitis in the past though no imaging confirming this.  History of appendectomy 2 years ago for acute appendicitis.  Also notes uncontrolled acid reflux.  Describes a burning in her chest.  Started on pantoprazole which does not seem to help.  Moderate, constant.  Intermittent Advil use.  No history of H. pylori or peptic ulcer disease.  Does note family history of gastric cancer.  No dysphagia odynophagia.  Also with chronic constipation.  States she has a bowel movement once every 2 days on average.  These are small BMs.  Notes associated straining.  No melena hematochezia.  No previous upper endoscopy or colonoscopy.    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   Medical history non-contributory     Pneumonia    2013   Restless leg    Vaginal Pap smear, abnormal     Past Surgical History:  Procedure Laterality Date   APPENDECTOMY     COLPOSCOPY W/ BIOPSY / CURETTAGE     IUD REMOVAL     LAPAROSCOPIC APPENDECTOMY N/A 10/18/2019   Procedure: APPENDECTOMY LAPAROSCOPIC;  Surgeon: Bridges, Lindsay C, MD;  Location: [facility];  Service: General;  Laterality: N/A;   LAPAROSCOPY ABDOMEN DIAGNOSTIC     Removal of migrated IUD    TUBAL LIGATION N/A 12/11/2020   Procedure: POST PARTUM TUBAL LIGATION;  Surgeon: Anyanwu, Ugonna A, MD;  Location: [facility];  Service: Gynecology;  Laterality: N/A;    Current Outpatient Medications  Medication Sig Dispense Refill   albuterol (VENTOLIN HFA) 108 (90 Base) MCG/ACT inhaler Inhale 2 puffs into the lungs every 6 (six) hours as needed for wheezing or shortness of breath.     amLODipine (NORVASC) 5 MG tablet Take 1 tablet (5 mg total) by mouth daily. 30 tablet 2   ibuprofen (ADVIL) 800 MG tablet Take 1 tablet (800 mg total) by mouth every 8 (eight) hours as needed. 30 tablet 5   losartan (COZAAR) 50 MG tablet Take 1 tablet (50 mg total) by mouth daily. 30 tablet 2   Multiple Vitamin (MULTIVITAMIN) capsule Take 1 capsule by mouth daily.     OVER THE COUNTER   MEDICATION Vitamin D gummies two per day.     pantoprazole (PROTONIX) 40 MG tablet Take 1 tablet (40 mg total) by mouth daily. 30 tablet 2   No current facility-administered medications for this visit.    Allergies as of 09/11/2022   (No Known Allergies)    Family History  Problem Relation Age of Onset   Diabetes Mother    Hypertension Mother    Cancer Mother    Diabetes Father    Cancer Paternal Grandmother        liver & lung    Social History   Socioeconomic History   Marital status: Married    Spouse name: Joseluis Juarez   Number of children: 4   Years of education: Not on file   Highest education level: Not on file  Occupational History   Not on file  Tobacco Use   Smoking  status: Former    Packs/day: 0.25    Years: 1.00    Additional pack years: 0.00    Total pack years: 0.25    Types: Cigarettes    Quit date: 05/03/2005    Years since quitting: 17.3   Smokeless tobacco: Never  Vaping Use   Vaping Use: Never used  Substance and Sexual Activity   Alcohol use: Yes    Comment: Occ   Drug use: Yes    Types: Cocaine    Comment: last used August 2021   Sexual activity: Yes    Partners: Male    Birth control/protection: Surgical  Other Topics Concern   Not on file  Social History Narrative   Not on file   Social Determinants of Health   Financial Resource Strain: Not on file  Food Insecurity: Food Insecurity Present (11/15/2020)   Hunger Vital Sign    Worried About Running Out of Food in the Last Year: Sometimes true    Ran Out of Food in the Last Year: Sometimes true  Transportation Needs: No Transportation Needs (11/15/2020)   PRAPARE - Transportation    Lack of Transportation (Medical): No    Lack of Transportation (Non-Medical): No  Physical Activity: Not on file  Stress: Stress Concern Present (03/28/2021)   [employer] - Occupational Stress Questionnaire    Feeling of Stress : Rather much  Social Connections: Not on file  Intimate Partner Violence: Not on file    Subjective: Review of Systems  Constitutional:  Negative for chills and fever.  HENT:  Negative for congestion and hearing loss.   Eyes:  Negative for blurred vision and double vision.  Respiratory:  Negative for cough and shortness of breath.   Cardiovascular:  Negative for chest pain and palpitations.  Gastrointestinal:  Positive for abdominal pain, constipation and heartburn. Negative for blood in stool, diarrhea, melena and vomiting.  Genitourinary:  Negative for dysuria and urgency.  Musculoskeletal:  Negative for joint pain and myalgias.  Skin:  Negative for itching and rash.  Neurological:  Negative for dizziness and headaches.   Psychiatric/Behavioral:  Negative for depression. The patient is not nervous/anxious.        Objective: BP 139/86 (BP Location: Left Arm, Patient Position: Sitting, Cuff Size: Large)   Pulse 70   Temp (!) 97.5 F (36.4 C) (Temporal)   Ht 5' 2" (1.575 m)   Wt 169 lb 11.2 oz (77 kg)   BMI 31.04 kg/m  Physical Exam Constitutional:      Appearance: Normal appearance.  HENT:     Head: Normocephalic and atraumatic.    Eyes:     Extraocular Movements: Extraocular movements intact.     Conjunctiva/sclera: Conjunctivae normal.  Cardiovascular:     Rate and Rhythm: Normal rate and regular rhythm.  Pulmonary:     Effort: Pulmonary effort is normal.     Breath sounds: Normal breath sounds.  Abdominal:     General: Bowel sounds are normal.     Palpations: Abdomen is soft.  Musculoskeletal:        General: No swelling. Normal range of motion.     Cervical back: Normal range of motion and neck supple.  Skin:    General: Skin is warm and dry.     Coloration: Skin is not jaundiced.  Neurological:     General: No focal deficit present.     Mental Status: She is alert and oriented to person, place, and time.  Psychiatric:        Mood and Affect: Mood normal.        Behavior: Behavior normal.      Assessment: *GERD-not well-controlled *Epigastric pain *Chronic constipation *Weight loss  Plan: Will schedule for EGD to evaluate for peptic ulcer disease, esophagitis, gastritis, H. Pylori, duodenitis, or other. Will also evaluate for esophageal stricture, Schatzki's ring, esophageal web or other.   The risks including infection, bleed, or perforation as well as benefits, limitations, alternatives and imponderables have been reviewed with the patient. Potential for esophageal dilation, biopsy, etc. have also been reviewed.  Questions have been answered. All parties agreeable.  Will check blood work today including TSH, celiac panel, CRP, ESR.  Call with results.  Continue on  pantoprazole daily for now.  May make adjustments pending endoscopic findings.  May pursue ultrasound if EGD unremarkable to rule out biliary colic.  For constipation, has trialed and failed multiple over-the-counter remedies.  Will give samples of Linzess 145 mcg daily and see how she does.  Call with report next week and I will send in formal prescription if improved.  Counseled on room to increase or decrease dose depending on how she responds.  Counseled on initial washout.  She understands.  Thank you Dr. Dixon for the kind referral.  09/11/2022 8:48 AM   Disclaimer: This note was dictated with voice recognition software. Similar sounding words can inadvertently be transcribed and may not be corrected upon review.  

## 2022-09-11 NOTE — Progress Notes (Addendum)
Primary Care Physician:  Johnette Abraham, MD Primary Gastroenterologist:  Dr. Abbey Chatters  Chief Complaint  Patient presents with   Consult    Patient here today due to issues with Jerrye Bushy and mid epigastric pain. Patient says this keeps her up most night. Patient says she takes pantoprazole 40 mg once per day which at times controls symptoms and at others does not. Patient had a recent Ed visit on 01/29/2022 at [facility] and was told she had acute pancreatitis. Patient says pcp told her she did not have any issues with her pancreas.     HPI:   RHYANN SENAT is a 37 y.o. female who presents to the clinic today by referral from her PCP Dr. Doren Custard for evaluation.  Multiple GI complaints for me today.  Notes chronic epigastric pain.  Symptoms intermittent happening multiple times weekly.  Sometimes severe.  Eating makes it worse.  She has lost weight as she has been scared to eat due to her symptoms.  169 pounds today.  Previously weighed more than 190 pounds last year.  Diagnosed with pancreatitis in the past though no imaging confirming this.  History of appendectomy 2 years ago for acute appendicitis.  Also notes uncontrolled acid reflux.  Describes a burning in her chest.  Started on pantoprazole which does not seem to help.  Moderate, constant.  Intermittent Advil use.  No history of H. pylori or peptic ulcer disease.  Does note family history of gastric cancer.  No dysphagia odynophagia.  Also with chronic constipation.  States she has a bowel movement once every 2 days on average.  These are small BMs.  Notes associated straining.  No melena hematochezia.  No previous upper endoscopy or colonoscopy.    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   Medical history non-contributory     Pneumonia    2013   Restless leg    Vaginal Pap smear, abnormal     Past Surgical History:  Procedure Laterality Date   APPENDECTOMY     COLPOSCOPY W/ BIOPSY / CURETTAGE     IUD REMOVAL     LAPAROSCOPIC APPENDECTOMY N/A 10/18/2019   Procedure: APPENDECTOMY LAPAROSCOPIC;  Surgeon: Virl Cagey, MD;  Location: [facility];  Service: General;  Laterality: N/A;   LAPAROSCOPY ABDOMEN DIAGNOSTIC     Removal of migrated IUD    TUBAL LIGATION N/A 12/11/2020   Procedure: POST PARTUM TUBAL LIGATION;  Surgeon: Osborne Oman, MD;  Location: [facility];  Service: Gynecology;  Laterality: N/A;    Current Outpatient Medications  Medication Sig Dispense Refill   albuterol (VENTOLIN HFA) 108 (90 Base) MCG/ACT inhaler Inhale 2 puffs into the lungs every 6 (six) hours as needed for wheezing or shortness of breath.     amLODipine (NORVASC) 5 MG tablet Take 1 tablet (5 mg total) by mouth daily. 30 tablet 2   ibuprofen (ADVIL) 800 MG tablet Take 1 tablet (800 mg total) by mouth every 8 (eight) hours as needed. 30 tablet 5   losartan (COZAAR) 50 MG tablet Take 1 tablet (50 mg total) by mouth daily. 30 tablet 2   Multiple Vitamin (MULTIVITAMIN) capsule Take 1 capsule by mouth daily.     OVER THE COUNTER  MEDICATION Vitamin D gummies two per day.     pantoprazole (PROTONIX) 40 MG tablet Take 1 tablet (40 mg total) by mouth daily. 30 tablet 2   No current facility-administered medications for this visit.    Allergies as of 09/11/2022   (No Known Allergies)    Family History  Problem Relation Age of Onset   Diabetes Mother    Hypertension Mother    Cancer Mother    Diabetes Father    Cancer Paternal Grandmother        liver & lung    Social History   Socioeconomic History   Marital status: Married    Spouse name: Loney Hering   Number of children: 4   Years of education: Not on file   Highest education level: Not on file  Occupational History   Not on file  Tobacco Use   Smoking  status: Former    Packs/day: 0.25    Years: 1.00    Additional pack years: 0.00    Total pack years: 0.25    Types: Cigarettes    Quit date: 05/03/2005    Years since quitting: 17.3   Smokeless tobacco: Never  Vaping Use   Vaping Use: Never used  Substance and Sexual Activity   Alcohol use: Yes    Comment: Occ   Drug use: Yes    Types: Cocaine    Comment: last used August 2021   Sexual activity: Yes    Partners: Male    Birth control/protection: Surgical  Other Topics Concern   Not on file  Social History Narrative   Not on file   Social Determinants of Health   Financial Resource Strain: Not on file  Food Insecurity: Food Insecurity Present (11/15/2020)   Hunger Vital Sign    Worried About Running Out of Food in the Last Year: Sometimes true    Ran Out of Food in the Last Year: Sometimes true  Transportation Needs: No Transportation Needs (11/15/2020)   PRAPARE - Hydrologist (Medical): No    Lack of Transportation (Non-Medical): No  Physical Activity: Not on file  Stress: Stress Concern Present (03/28/2021)   [place]    Feeling of Stress : Rather much  Social Connections: Not on file  Intimate Partner Violence: Not on file    Subjective: Review of Systems  Constitutional:  Negative for chills and fever.  HENT:  Negative for congestion and hearing loss.   Eyes:  Negative for blurred vision and double vision.  Respiratory:  Negative for cough and shortness of breath.   Cardiovascular:  Negative for chest pain and palpitations.  Gastrointestinal:  Positive for abdominal pain, constipation and heartburn. Negative for blood in stool, diarrhea, melena and vomiting.  Genitourinary:  Negative for dysuria and urgency.  Musculoskeletal:  Negative for joint pain and myalgias.  Skin:  Negative for itching and rash.  Neurological:  Negative for dizziness and headaches.   Psychiatric/Behavioral:  Negative for depression. The patient is not nervous/anxious.        Objective: BP 139/86 (BP Location: Left Arm, Patient Position: Sitting, Cuff Size: Large)   Pulse 70   Temp (!) 97.5 F (36.4 C) (Temporal)   Ht 5\' 2"  (1.575 m)   Wt 169 lb 11.2 oz (77 kg)   BMI 31.04 kg/m  Physical Exam Constitutional:      Appearance: Normal appearance.  HENT:     Head: Normocephalic and atraumatic.  Eyes:     Extraocular Movements: Extraocular movements intact.     Conjunctiva/sclera: Conjunctivae normal.  Cardiovascular:     Rate and Rhythm: Normal rate and regular rhythm.  Pulmonary:     Effort: Pulmonary effort is normal.     Breath sounds: Normal breath sounds.  Abdominal:     General: Bowel sounds are normal.     Palpations: Abdomen is soft.  Musculoskeletal:        General: No swelling. Normal range of motion.     Cervical back: Normal range of motion and neck supple.  Skin:    General: Skin is warm and dry.     Coloration: Skin is not jaundiced.  Neurological:     General: No focal deficit present.     Mental Status: She is alert and oriented to person, place, and time.  Psychiatric:        Mood and Affect: Mood normal.        Behavior: Behavior normal.      Assessment: *GERD-not well-controlled *Epigastric pain *Chronic constipation *Weight loss  Plan: Will schedule for EGD to evaluate for peptic ulcer disease, esophagitis, gastritis, H. Pylori, duodenitis, or other. Will also evaluate for esophageal stricture, Schatzki's ring, esophageal web or other.   The risks including infection, bleed, or perforation as well as benefits, limitations, alternatives and imponderables have been reviewed with the patient. Potential for esophageal dilation, biopsy, etc. have also been reviewed.  Questions have been answered. All parties agreeable.  Will check blood work today including TSH, celiac panel, CRP, ESR.  Call with results.  Continue on  pantoprazole daily for now.  May make adjustments pending endoscopic findings.  May pursue ultrasound if EGD unremarkable to rule out biliary colic.  For constipation, has trialed and failed multiple over-the-counter remedies.  Will give samples of Linzess 145 mcg daily and see how she does.  Call with report next week and I will send in formal prescription if improved.  Counseled on room to increase or decrease dose depending on how she responds.  Counseled on initial washout.  She understands.  Thank you Dr. Doren Custard for the kind referral.  09/11/2022 8:48 AM   Disclaimer: This note was dictated with voice recognition software. Similar sounding words can inadvertently be transcribed and may not be corrected upon review.

## 2022-09-11 NOTE — Addendum Note (Signed)
Addended by: Carmelina Noun on: 09/11/2022 10:02 AM   Modules accepted: Orders

## 2022-09-11 NOTE — Patient Instructions (Signed)
We will schedule you for upper endoscopy to further evaluate your chronic reflux and epigastric pain.  Continue on pantoprazole daily for now.  We may make adjustments depending on endoscopic findings.  For your constipation, I am going to give you samples of Linzess 145 mcg daily.  Let us know next week how you are doing and I will send in formal prescription if improved.  We have room to increase or decrease dose depending on how you respond.  Linzess works best when taken once a day every day, on an empty stomach, at least 30 minutes before your first meal of the day.  When Linzess is taken daily as directed:  *Constipation relief is typically felt in about a week *IBS-C patients may begin to experience relief from belly pain and overall abdominal symptoms (pain, discomfort, and bloating) in about 1 week,   with symptoms typically improving over 12 weeks.  Diarrhea may occur in the first 2 weeks -keep taking it.  The diarrhea should go away and you should start having normal, complete, full bowel movements. It may be helpful to start treatment when you can be near the comfort of your own bathroom, such as a weekend.   It was very nice meeting you today.  Dr. Abbey Chatters

## 2022-09-13 ENCOUNTER — Ambulatory Visit: Payer: Self-pay | Admitting: Internal Medicine

## 2022-10-03 ENCOUNTER — Other Ambulatory Visit (HOSPITAL_COMMUNITY)
Admission: RE | Admit: 2022-10-03 | Discharge: 2022-10-03 | Disposition: A | Discharge status: Home / Self Care | Payer: Medicaid Other | Source: Ambulatory Visit | Attending: Internal Medicine | Admitting: Internal Medicine

## 2022-10-03 DIAGNOSIS — R1013 Epigastric pain: Secondary | ICD-10-CM | POA: Diagnosis not present

## 2022-10-03 LAB — C-REACTIVE PROTEIN: CRP: 0.5 mg/dL (ref <1.0)

## 2022-10-03 LAB — SEDIMENTATION RATE: Sed Rate: 5 mm/hr (ref 0–22)

## 2022-10-03 LAB — T4, FREE: Free T4: 0.66 ng/dL (ref 0.61–1.12)

## 2022-10-03 LAB — TSH: TSH: 1.239 u[IU]/mL (ref 0.350–4.500)

## 2022-10-04 LAB — GLIADIN ANTIBODIES, SERUM
Antigliadin Abs, IgA: 4 units (ref 0–19)
Gliadin IgG: 1 units (ref 0–19)

## 2022-10-05 LAB — RETICULIN ANTIBODIES, IGA W TITER: Reticulin Ab, IgA: NEGATIVE titer (ref <2.5)

## 2022-10-05 LAB — TISSUE TRANSGLUTAMINASE, IGA: Tissue Transglutaminase Ab, IgA: <2 U/mL (ref 0–3)

## 2022-10-07 ENCOUNTER — Telehealth: Payer: Self-pay | Admitting: *Deleted

## 2022-10-07 DIAGNOSIS — K219 Gastro-esophageal reflux disease without esophagitis: Secondary | ICD-10-CM

## 2022-10-07 DIAGNOSIS — R1013 Epigastric pain: Secondary | ICD-10-CM

## 2022-10-07 NOTE — Telephone Encounter (Signed)
Carron Brazen, RN sent message stating that pt need order and notify pt for pregnancy test before 10/11/22 procedure.  Pt has been informed, will go on 10/09/22. Order has been placed in epic.

## 2022-10-09 ENCOUNTER — Other Ambulatory Visit (HOSPITAL_COMMUNITY)
Admission: RE | Admit: 2022-10-09 | Discharge: 2022-10-09 | Disposition: A | Discharge status: Home / Self Care | Payer: Medicaid Other | Source: Ambulatory Visit | Attending: Internal Medicine | Admitting: Internal Medicine

## 2022-10-09 DIAGNOSIS — R1013 Epigastric pain: Secondary | ICD-10-CM | POA: Diagnosis not present

## 2022-10-09 DIAGNOSIS — K219 Gastro-esophageal reflux disease without esophagitis: Secondary | ICD-10-CM | POA: Insufficient documentation

## 2022-10-09 LAB — PREGNANCY, URINE: Preg Test, Ur: NEGATIVE

## 2022-10-11 ENCOUNTER — Encounter (HOSPITAL_COMMUNITY)
Admission: RE | Disposition: A | Discharge status: Home / Self Care | Payer: Self-pay | Source: Home / Self Care | Attending: Internal Medicine

## 2022-10-11 ENCOUNTER — Ambulatory Visit (HOSPITAL_COMMUNITY): Payer: Medicaid Other | Admitting: Anesthesiology

## 2022-10-11 ENCOUNTER — Ambulatory Visit (HOSPITAL_COMMUNITY)
Admission: RE | Admit: 2022-10-11 | Discharge: 2022-10-11 | Disposition: A | Discharge status: Home / Self Care | Payer: Medicaid Other | Attending: Internal Medicine | Admitting: Internal Medicine

## 2022-10-11 ENCOUNTER — Other Ambulatory Visit: Payer: Self-pay

## 2022-10-11 ENCOUNTER — Encounter (HOSPITAL_COMMUNITY): Payer: Self-pay

## 2022-10-11 ENCOUNTER — Ambulatory Visit (HOSPITAL_BASED_OUTPATIENT_CLINIC_OR_DEPARTMENT_OTHER): Payer: Medicaid Other | Admitting: Anesthesiology

## 2022-10-11 DIAGNOSIS — K2970 Gastritis, unspecified, without bleeding: Secondary | ICD-10-CM | POA: Diagnosis not present

## 2022-10-11 DIAGNOSIS — K219 Gastro-esophageal reflux disease without esophagitis: Secondary | ICD-10-CM | POA: Diagnosis not present

## 2022-10-11 DIAGNOSIS — Z5941 Food insecurity: Secondary | ICD-10-CM | POA: Diagnosis not present

## 2022-10-11 DIAGNOSIS — Z6831 Body mass index (BMI) 31.0-31.9, adult: Secondary | ICD-10-CM | POA: Diagnosis not present

## 2022-10-11 DIAGNOSIS — K222 Esophageal obstruction: Secondary | ICD-10-CM | POA: Insufficient documentation

## 2022-10-11 DIAGNOSIS — R634 Abnormal weight loss: Secondary | ICD-10-CM | POA: Diagnosis not present

## 2022-10-11 DIAGNOSIS — K5909 Other constipation: Secondary | ICD-10-CM | POA: Diagnosis not present

## 2022-10-11 DIAGNOSIS — Z87891 Personal history of nicotine dependence: Secondary | ICD-10-CM

## 2022-10-11 DIAGNOSIS — I10 Essential (primary) hypertension: Secondary | ICD-10-CM | POA: Diagnosis not present

## 2022-10-11 DIAGNOSIS — J45909 Unspecified asthma, uncomplicated: Secondary | ICD-10-CM

## 2022-10-11 DIAGNOSIS — R1013 Epigastric pain: Secondary | ICD-10-CM | POA: Diagnosis present

## 2022-10-11 DIAGNOSIS — F418 Other specified anxiety disorders: Secondary | ICD-10-CM

## 2022-10-11 HISTORY — PX: BIOPSY: SHX5522

## 2022-10-11 HISTORY — PX: ESOPHAGOGASTRODUODENOSCOPY (EGD) WITH PROPOFOL: SHX5813

## 2022-10-11 SURGERY — ESOPHAGOGASTRODUODENOSCOPY (EGD) WITH PROPOFOL
Anesthesia: General

## 2022-10-11 MED ORDER — PANTOPRAZOLE SODIUM 40 MG PO TBEC
40.0000 mg | DELAYED_RELEASE_TABLET | Freq: Two times a day (BID) | ORAL | 5 refills | Status: DC
Start: 2022-10-11 — End: 2023-03-06

## 2022-10-11 MED ORDER — PROPOFOL 10 MG/ML IV BOLUS
INTRAVENOUS | Status: DC | PRN
  Administered 2022-10-11: 150 mg via INTRAVENOUS

## 2022-10-11 MED ORDER — LACTATED RINGERS IV SOLN: INTRAVENOUS | Status: DC

## 2022-10-11 MED ORDER — LINACLOTIDE 145 MCG PO CAPS: 145.0000 ug | ORAL_CAPSULE | Freq: Every day | ORAL | 3 refills | Status: DC

## 2022-10-11 MED ORDER — LACTATED RINGERS IV SOLN
INTRAVENOUS | Status: DC
  Administered 2022-10-11: 1000 mL via INTRAVENOUS

## 2022-10-11 NOTE — Interval H&P Note (Signed)
History and Physical Interval Note:  10/11/2022 10:13 AM  Andrea Burns  has presented today for surgery, with the diagnosis of EPIGASTRIC PAIN.  The various methods of treatment have been discussed with the patient and family. After consideration of risks, benefits and other options for treatment, the patient has consented to  Procedure(s) with comments: ESOPHAGOGASTRODUODENOSCOPY (EGD) WITH PROPOFOL (N/A) - 10:30 AM;ASA 2 as a surgical intervention.  The patient's history has been reviewed, patient examined, no change in status, stable for surgery.  I have reviewed the patient's chart and labs.  Questions were answered to the patient's satisfaction.     Lanelle Bal

## 2022-10-11 NOTE — Op Note (Signed)
[facility] Patient Name: Andrea Burns Procedure Date: 10/11/2022 10:13 AM MRN: [id number] Date of Birth: 13-Dec-1985 Attending MD: Hennie Duos. Marletta Lor , [place], [id number] CSN: [id number] Age: 37 Admit Type: Outpatient Procedure:                Upper GI endoscopy Indications:              Epigastric abdominal pain Providers:                Hennie Duos. Marletta Lor, DO, Jessica Boudreaux, Burke Keels, Technician Referring MD:              Medicines:                See the Anesthesia note for documentation of the                            administered medications Complications:            No immediate complications. Estimated Blood Loss:     Estimated blood loss was minimal. Procedure:                Pre-Anesthesia Assessment:                           - The anesthesia plan was to use monitored                            anesthesia care (MAC).                           After obtaining informed consent, the endoscope was                            passed under direct vision. Throughout the                            procedure, the patient's blood pressure, pulse, and                            oxygen saturations were monitored continuously. The                            [id number]-H190 ([id number]) scope was introduced through the                            mouth, and advanced to the second part of duodenum.                            The upper GI endoscopy was accomplished without                            difficulty. The patient tolerated the procedure                            well. Scope In: 10:30:14 AM  Scope Out: 10:32:40 AM Total Procedure Duration: 0 hours 2 minutes 26 seconds  Findings:      The Z-line was regular and was found 35 cm from the incisors.      Patchy mild inflammation characterized by erythema was found in the       gastric body and in the gastric antrum. Biopsies were taken with a cold       forceps for Helicobacter pylori testing.      The  duodenal bulb, first portion of the duodenum and second portion of       the duodenum were normal. Impression:               - Z-line regular, 35 cm from the incisors.                           - Gastritis. Biopsied.                           - Normal duodenal bulb, first portion of the                            duodenum and second portion of the duodenum. Moderate Sedation:      Per Anesthesia Care Recommendation:           - Patient has a contact number available for                            emergencies. The signs and symptoms of potential                            delayed complications were discussed with the                            patient. Return to normal activities tomorrow.                            Written discharge instructions were provided to the                            patient.                           - Resume previous diet.                           - Continue present medications.                           - Await pathology results.                           - Use Protonix (pantoprazole) 40 mg PO BID.                           - Return to GI clinic in 3 months. Procedure Code(s):        --- Professional ---  16109, Esophagogastroduodenoscopy, flexible,                            transoral; with biopsy, single or multiple Diagnosis Code(s):        --- Professional ---                           K29.70, Gastritis, unspecified, without bleeding                           R10.13, Epigastric pain CPT copyright 2022 American Medical Association. All rights reserved. The codes documented in this report are preliminary and upon coder review may  be revised to meet current compliance requirements. Hennie Duos. Marletta Lor, DO Hennie Duos. Marletta Lor, DO 10/11/2022 11:01:26 AM This report has been signed electronically. Number of Addenda: 0

## 2022-10-11 NOTE — Anesthesia Preprocedure Evaluation (Signed)
Anesthesia Evaluation  Patient identified by MRN, date of birth, ID band Patient awake    Reviewed: Allergy & Precautions, H&P , NPO status , Patient's Chart, lab work & pertinent test results, reviewed documented beta blocker date and time   History of Anesthesia Complications (+) PROLONGED EMERGENCE and history of anesthetic complications  Airway Mallampati: II  TM Distance: >3 FB Neck ROM: full    Dental no notable dental hx.    Pulmonary neg pulmonary ROS, asthma , pneumonia, former smoker   Pulmonary exam normal breath sounds clear to auscultation       Cardiovascular Exercise Tolerance: Good hypertension, negative cardio ROS  Rhythm:regular Rate:Normal     Neuro/Psych  Headaches PSYCHIATRIC DISORDERS Anxiety Depression    negative neurological ROS  negative psych ROS   GI/Hepatic negative GI ROS, Neg liver ROS,GERD  ,,  Endo/Other  negative endocrine ROS    Renal/GU negative Renal ROS  negative genitourinary   Musculoskeletal   Abdominal   Peds  Hematology negative hematology ROS (+) Blood dyscrasia, anemia   Anesthesia Other Findings   Reproductive/Obstetrics negative OB ROS                             Anesthesia Physical Anesthesia Plan  ASA: 2  Anesthesia Plan: General   Post-op Pain Management:    Induction:   PONV Risk Score and Plan: Propofol infusion  Airway Management Planned:   Additional Equipment:   Intra-op Plan:   Post-operative Plan:   Informed Consent: I have reviewed the patients History and Physical, chart, labs and discussed the procedure including the risks, benefits and alternatives for the proposed anesthesia with the patient or authorized representative who has indicated his/her understanding and acceptance.     Dental Advisory Given  Plan Discussed with: CRNA  Anesthesia Plan Comments:        Anesthesia Quick Evaluation

## 2022-10-11 NOTE — Discharge Instructions (Addendum)
EGD Discharge instructions Please read the instructions outlined below and refer to this sheet in the next few weeks. These discharge instructions provide you with general information on caring for yourself after you leave the hospital. Your doctor may also give you specific instructions. While your treatment has been planned according to the most current medical practices available, unavoidable complications occasionally occur. If you have any problems or questions after discharge, please call your doctor. ACTIVITY You may resume your regular activity but move at a slower pace for the next 24 hours.  Take frequent rest periods for the next 24 hours.  Walking will help expel (get rid of) the air and reduce the bloated feeling in your abdomen.  No driving for 24 hours (because of the anesthesia (medicine) used during the test).  You may shower.  Do not sign any important legal documents or operate any machinery for 24 hours (because of the anesthesia used during the test).  NUTRITION Drink plenty of fluids.  You may resume your normal diet.  Begin with a light meal and progress to your normal diet.  Avoid alcoholic beverages for 24 hours or as instructed by your caregiver.  MEDICATIONS You may resume your normal medications unless your caregiver tells you otherwise.  WHAT YOU CAN EXPECT TODAY You may experience abdominal discomfort such as a feeling of fullness or "gas" pains.  FOLLOW-UP Your doctor will discuss the results of your test with you.  SEEK IMMEDIATE MEDICAL ATTENTION IF ANY OF THE FOLLOWING OCCUR: Excessive nausea (feeling sick to your stomach) and/or vomiting.  Severe abdominal pain and distention (swelling).  Trouble swallowing.  Temperature over 101 F (37.8 C).  Rectal bleeding or vomiting of blood.   Your EGD revealed mild amount inflammation in your stomach.  I took biopsies of this to rule out infection with a bacteria called H. pylori.  Await pathology results, my  office will contact you.  Esophagus and small bowel appeared normal.  I am going to increase your pantoprazole to twice daily.   I have also sent in a prescription for Linzess 145 mcg for your constipation.  We may consider ultrasound of your gallbladder if not improved on twice daily pantoprazole.     Follow up in GI office in 2-3 months.    OFFICE WILL CONTACT YOU OR CHECK MYCHART Felicity ACCOUNT   I hope you have a great rest of your week!  Hennie Duos. Marletta Lor, D.O. Gastroenterology and Hepatology [facility]

## 2022-10-11 NOTE — Anesthesia Procedure Notes (Signed)
Date/Time: 10/11/2022 10:24 AM  Performed by: Franco Nones, CRNAPre-anesthesia Checklist: Patient identified, Emergency Drugs available, Suction available, Timeout performed and Patient being monitored Patient Re-evaluated:Patient Re-evaluated prior to induction Oxygen Delivery Method: Nasal Cannula

## 2022-10-11 NOTE — Transfer of Care (Signed)
Immediate Anesthesia Transfer of Care Note  Patient: Andrea Burns  Procedure(s) Performed: ESOPHAGOGASTRODUODENOSCOPY (EGD) WITH PROPOFOL BIOPSY  Patient Location: Endoscopy Unit  Anesthesia Type:General  Level of Consciousness: drowsy  Airway & Oxygen Therapy: Patient Spontanous Breathing  Post-op Assessment: Report given to RN and Post -op Vital signs reviewed and stable  Post vital signs: Reviewed and stable  Last Vitals:  Vitals Value Taken Time  BP 115/86 10/11/22 1036  Temp 36.6 C 10/11/22 1036  Pulse 68 10/11/22 1036  Resp 21 10/11/22 1036  SpO2 97 % 10/11/22 1036    Last Pain:  Vitals:   10/11/22 1036  TempSrc: Oral  PainSc:       Patients Stated Pain Goal: 8 (10/11/22 0913)  Complications: No notable events documented.

## 2022-10-13 NOTE — Anesthesia Postprocedure Evaluation (Signed)
Anesthesia Post Note  Patient: Andrea Burns  Procedure(s) Performed: ESOPHAGOGASTRODUODENOSCOPY (EGD) WITH PROPOFOL BIOPSY  Patient location during evaluation: Phase II Anesthesia Type: General Level of consciousness: awake Pain management: pain level controlled Vital Signs Assessment: post-procedure vital signs reviewed and stable Respiratory status: spontaneous breathing and respiratory function stable Cardiovascular status: blood pressure returned to baseline and stable Postop Assessment: no headache and no apparent nausea or vomiting Anesthetic complications: no Comments: Late entry   No notable events documented.   Last Vitals:  Vitals:   10/11/22 0913 10/11/22 1036  BP: (!) 110/59 115/86  Pulse: 77 68  Resp: 18 (!) 21  Temp: 36.7 C 36.6 C  SpO2: 100% 97%    Last Pain:  Vitals:   10/11/22 1036  TempSrc: Oral  PainSc: 0-No pain                 Windell Norfolk

## 2022-10-14 LAB — SURGICAL PATHOLOGY

## 2022-10-18 ENCOUNTER — Encounter (HOSPITAL_COMMUNITY): Payer: Self-pay | Admitting: Internal Medicine

## 2022-11-09 DIAGNOSIS — Z419 Encounter for procedure for purposes other than remedying health state, unspecified: Secondary | ICD-10-CM | POA: Diagnosis not present

## 2022-11-12 ENCOUNTER — Encounter: Payer: Self-pay | Admitting: Internal Medicine

## 2022-11-14 ENCOUNTER — Other Ambulatory Visit: Payer: Self-pay | Admitting: Internal Medicine

## 2022-11-14 DIAGNOSIS — I10 Essential (primary) hypertension: Secondary | ICD-10-CM

## 2022-11-18 ENCOUNTER — Ambulatory Visit: Payer: Medicaid Other | Admitting: Internal Medicine

## 2022-11-25 ENCOUNTER — Ambulatory Visit
Admission: EM | Admit: 2022-11-25 | Discharge: 2022-11-25 | Disposition: A | Discharge status: Home / Self Care | Payer: Medicaid Other | Attending: Nurse Practitioner | Admitting: Nurse Practitioner

## 2022-11-25 DIAGNOSIS — J22 Unspecified acute lower respiratory infection: Secondary | ICD-10-CM

## 2022-11-25 DIAGNOSIS — Z8709 Personal history of other diseases of the respiratory system: Secondary | ICD-10-CM

## 2022-11-25 MED ORDER — ALBUTEROL SULFATE (2.5 MG/3ML) 0.083% IN NEBU: 2.5000 mg | INHALATION_SOLUTION | Freq: Four times a day (QID) | RESPIRATORY_TRACT | 0 refills | Status: DC | PRN

## 2022-11-25 MED ORDER — ALBUTEROL SULFATE HFA 108 (90 BASE) MCG/ACT IN AERS: 2.0000 | INHALATION_SPRAY | Freq: Four times a day (QID) | RESPIRATORY_TRACT | 0 refills | Status: DC | PRN

## 2022-11-25 MED ORDER — AZITHROMYCIN 250 MG PO TABS: 250.0000 mg | ORAL_TABLET | Freq: Every day | ORAL | 0 refills | Status: DC

## 2022-11-25 MED ORDER — METHYLPREDNISOLONE SODIUM SUCC 125 MG IJ SOLR: 80.0000 mg | Freq: Once | INTRAMUSCULAR | Status: DC

## 2022-11-25 MED ORDER — PREDNISONE 20 MG PO TABS: 40.0000 mg | ORAL_TABLET | Freq: Every day | ORAL | 0 refills | Status: AC

## 2022-11-25 MED ORDER — PROMETHAZINE-DM 6.25-15 MG/5ML PO SYRP: 5.0000 mL | ORAL_SOLUTION | Freq: Four times a day (QID) | ORAL | 0 refills | Status: DC | PRN

## 2022-11-25 NOTE — ED Provider Notes (Signed)
[facility]    CSN: [id number] Arrival date & time: 11/25/22  1837      History   Chief Complaint No chief complaint on file.   HPI Andrea Burns is a 37 y.o. female.   The history is provided by the patient.   Patient presents for complaints of cough that is been present for the past 3 weeks.  She denies recent fever, chills, nasal congestion, runny nose, difficulty breathing, chest pain, abdominal pain, nausea, vomiting, or diarrhea.  She reports that she has been using her albuterol inhaler and her daughters nebulizer solution as she has a history of asthma.  She states that she has been taking over-the-counter cough and cold medications with minimal relief.  Patient denies any recent hospitalizations or asthma exacerbations.  She also denies history of smoking.  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   Medical history non-contributory    Pneumonia    2013   Restless leg    Vaginal Pap smear, abnormal     Patient Active Problem List   Diagnosis Date Noted   Epigastric abdominal pain 08/16/2022   Acute cystitis without hematuria 08/16/2022   Vitamin D deficiency 02/18/2022   Essential hypertension 02/04/2022   Anxiety 02/04/2022   GERD (gastroesophageal reflux disease) 02/04/2022   History of acute pancreatitis 02/04/2022   BMI 32.0-32.9,adult 02/04/2022   Preventative health care 02/04/2022   Elevated blood pressure affecting pregnancy in first trimester, antepartum 08/20/2021   Leg pain 08/20/2021   Gallstones without obstruction of gallbladder 01/22/2021   Status post bilateral salpingectomy 12/11/2020   Chronic migraine w/o aura w/o status migrainosus, not intractable 10/06/2020   History of substance abuse (HCC) 05/23/2020    Past Surgical History:   Procedure Laterality Date   APPENDECTOMY     BIOPSY  10/11/2022   Procedure: BIOPSY;  Surgeon: Lanelle Bal, DO;  Location: [facility];  Service: Endoscopy;;   COLPOSCOPY W/ BIOPSY / CURETTAGE     ESOPHAGOGASTRODUODENOSCOPY (EGD) WITH PROPOFOL N/A 10/11/2022   Procedure: ESOPHAGOGASTRODUODENOSCOPY (EGD) WITH PROPOFOL;  Surgeon: Lanelle Bal, DO;  Location: [facility];  Service: Endoscopy;  Laterality: N/A;  10:30 AM;ASA 2   IUD REMOVAL     LAPAROSCOPIC APPENDECTOMY N/A 10/18/2019   Procedure: APPENDECTOMY LAPAROSCOPIC;  Surgeon: [name], MD;  Location: [facility];  Service: General;  Laterality: N/A;   LAPAROSCOPY ABDOMEN DIAGNOSTIC     Removal of migrated IUD    TUBAL LIGATION N/A 12/11/2020   Procedure: POST PARTUM TUBAL LIGATION;  Surgeon: Tereso Newcomer, MD;  Location: [facility];  Service: Gynecology;  Laterality: N/A;    OB History     Gravida  6   Para  5   Term  5   Preterm      AB  1   Living  5      SAB  1   IAB      Ectopic      Multiple  0   Live Births  5            Home Medications    Prior to Admission medications   Medication Sig Start Date End Date Taking? Authorizing Provider  albuterol (PROVENTIL) (2.5  MG/3ML) 0.083% nebulizer solution Take 3 mLs (2.5 mg total) by nebulization every 6 (six) hours as needed for wheezing or shortness of breath. 11/25/22  Yes Eder Macek-Warren, Sadie Haber, NP  albuterol (VENTOLIN HFA) 108 (90 Base) MCG/ACT inhaler Inhale 2 puffs into the lungs every 6 (six) hours as needed for wheezing or shortness of breath. 11/25/22  Yes Esmerelda Finnigan-Warren, Sadie Haber, NP  amLODipine (NORVASC) 5 MG tablet TAKE 1 TABLET(5 MG) BY MOUTH DAILY 11/14/22  Yes Billie Lade, MD  azithromycin (ZITHROMAX) 250 MG tablet Take 1 tablet (250 mg total) by mouth daily. Take first 2 tablets together, then 1 every day until finished. 11/25/22  Yes Gizzelle Lacomb-Warren, Sadie Haber, NP  ibuprofen (ADVIL) 800 MG tablet Take 1 tablet (800 mg  total) by mouth every 8 (eight) hours as needed. 08/01/22  Yes Brock Bad, MD  linaclotide [facility]) 145 MCG CAPS capsule Take 1 capsule (145 mcg total) by mouth daily before breakfast. 10/11/22 10/11/23 Yes Carver, Hennie Duos, DO  losartan (COZAAR) 50 MG tablet TAKE 1 TABLET(50 MG) BY MOUTH DAILY 11/14/22  Yes Billie Lade, MD  Multiple Vitamin (MULTIVITAMIN) capsule Take 1 capsule by mouth daily.   Yes [provider]  OVER THE COUNTER MEDICATION Vitamin D gummies two per day.   Yes [provider]  pantoprazole (PROTONIX) 40 MG tablet Take 1 tablet (40 mg total) by mouth 2 (two) times daily. 10/11/22 04/09/23 Yes Carver, Hennie Duos, DO  predniSONE (DELTASONE) 20 MG tablet Take 2 tablets (40 mg total) by mouth daily with breakfast for 5 days. 11/25/22 11/30/22 Yes Anum Palecek-Warren, Sadie Haber, NP  promethazine-dextromethorphan (PROMETHAZINE-DM) 6.25-15 MG/5ML syrup Take 5 mLs by mouth 4 (four) times daily as needed for cough. 11/25/22  Yes Lagina Reader-Warren, Sadie Haber, NP    Family History Family History  Problem Relation Age of Onset   Diabetes Mother    Hypertension Mother    Cancer Mother    Diabetes Father    Cancer Paternal Grandmother        liver & lung    Social History Social History   Tobacco Use   Smoking status: Former    Packs/day: 0.25    Years: 1.00    Additional pack years: 0.00    Total pack years: 0.25    Types: Cigarettes    Quit date: 05/03/2005    Years since quitting: 17.5   Smokeless tobacco: Never  Vaping Use   Vaping Use: Never used  Substance Use Topics   Alcohol use: Yes    Comment: Occ   Drug use: Not Currently    Types: Cocaine    Comment: last used August 2021     Allergies   Patient has no known allergies.   Review of Systems Review of Systems Per HPI  Physical Exam Triage Vital Signs ED Triage Vitals  Enc Vitals Group     BP 11/25/22 1910 (!) 163/129     Pulse Rate 11/25/22 1910 77     Resp 11/25/22 1910 19     Temp  11/25/22 1910 97.6 F (36.4 C)     Temp Source 11/25/22 1910 Oral     SpO2 11/25/22 1910 98 %     Weight --      Height --      Head Circumference --      Peak Flow --      Pain Score 11/25/22 1911 0     Pain Loc --      Pain Edu? --  Excl. in GC? --    No data found.  Updated Vital Signs BP (!) 164/99 (BP Location: Right Arm)   Pulse 77   Temp 97.6 F (36.4 C) (Oral)   Resp 19   LMP 10/23/2022 (Approximate)   SpO2 98%   Visual Acuity Right Eye Distance:   Left Eye Distance:   Bilateral Distance:    Right Eye Near:   Left Eye Near:    Bilateral Near:     Physical Exam Vitals and nursing note reviewed.  Constitutional:      General: She is not in acute distress.    Appearance: Normal appearance.  HENT:     Head: Normocephalic.     Right Ear: Tympanic membrane, ear canal and external ear normal.     Left Ear: Tympanic membrane, ear canal and external ear normal.     Mouth/Throat:     Mouth: Mucous membranes are moist.     Pharynx: Posterior oropharyngeal erythema present.     Comments: Cobblestoning present in posterior oropharynx Eyes:     Extraocular Movements: Extraocular movements intact.     Conjunctiva/sclera: Conjunctivae normal.     Pupils: Pupils are equal, round, and reactive to light.  Cardiovascular:     Rate and Rhythm: Normal rate and regular rhythm.     Pulses: Normal pulses.     Heart sounds: Normal heart sounds.  Pulmonary:     Effort: Pulmonary effort is normal. No respiratory distress.     Breath sounds: Normal breath sounds. No stridor. No wheezing, rhonchi or rales.  Abdominal:     General: Bowel sounds are normal.     Palpations: Abdomen is soft.     Tenderness: There is no abdominal tenderness.  Musculoskeletal:     Cervical back: Normal range of motion.  Lymphadenopathy:     Cervical: No cervical adenopathy.  Skin:    General: Skin is warm and dry.  Neurological:     General: No focal deficit present.     Mental Status:  She is alert and oriented to person, place, and time.  Psychiatric:        Mood and Affect: Mood normal.        Behavior: Behavior normal.      UC Treatments / Results  Labs (all labs ordered are listed, but only abnormal results are displayed) Labs Reviewed - No data to display  EKG   Radiology No results found.  Procedures Procedures (including critical care time)  Medications Ordered in UC Medications - No data to display   Initial Impression / Assessment and Plan / UC Course  I have reviewed the triage vital signs and the nursing notes.  Pertinent labs & imaging results that were available during my care of the patient were reviewed by me and considered in my medical decision making (see chart for details).  The patient is well-appearing, she is in no acute distress, vital signs are stable.  Symptoms appear to be consistent with a lower respiratory infection.  Patient also with underlying history of asthma.  Patient was offered Solu-Medrol 80 mg IM; she declined.  Will treat empirically with azithromycin 250 mg for infection, prednisone 40 mg for bronchial inflammation, Promethazine DM for the cough, and an albuterol inhaler and nebulizer solution for wheezing or shortness of breath as needed.  Supportive care recommendations were provided and discussed with the patient to include increasing fluids, allowing for plenty of rest, use of a humidifier in her bedroom at nighttime during  sleep, and sleeping elevated on pillows while cough symptoms persist.  Patient was advised that if she develops wheezing, shortness of breath, difficulty breathing, she can follow-up in this clinic or in the emergency department for further evaluation.  Patient was also advised to follow-up with her primary care physician within the next 7 to 10 days for reevaluation.  Patient is in agreement with this plan of care and verbalizes understanding.  All questions were answered.  Patient stable for  discharge.   Final Clinical Impressions(s) / UC Diagnoses   Final diagnoses:  Lower respiratory infection  History of asthma     Discharge Instructions      Take medication as prescribed. Increase fluids and allow for plenty of rest. May take over-the-counter Tylenol or ibuprofen as needed for pain, fever, or general discomfort. Recommend using a humidifier in your bedroom at nighttime during sleep and sleeping elevated on pillows while cough symptoms persist. If you experience wheezing, shortness of breath, or become unable to speak in a complete sentence, please go to the emergency department immediately for further evaluation. Please follow-up with your primary care physician within the next 7 to 10 days for reevaluation. Follow-up as needed.     ED Prescriptions     Medication Sig Dispense Auth. Provider   albuterol (VENTOLIN HFA) 108 (90 Base) MCG/ACT inhaler Inhale 2 puffs into the lungs every 6 (six) hours as needed for wheezing or shortness of breath. 8 g Reinette Cuneo-Warren, Sadie Haber, NP   albuterol (PROVENTIL) (2.5 MG/3ML) 0.083% nebulizer solution Take 3 mLs (2.5 mg total) by nebulization every 6 (six) hours as needed for wheezing or shortness of breath. 75 mL Geneva Barrero-Warren, Sadie Haber, NP   azithromycin (ZITHROMAX) 250 MG tablet Take 1 tablet (250 mg total) by mouth daily. Take first 2 tablets together, then 1 every day until finished. 6 tablet Johnatan Baskette-Warren, Sadie Haber, NP   predniSONE (DELTASONE) 20 MG tablet Take 2 tablets (40 mg total) by mouth daily with breakfast for 5 days. 10 tablet Danicia Terhaar-Warren, Sadie Haber, NP   promethazine-dextromethorphan (PROMETHAZINE-DM) 6.25-15 MG/5ML syrup Take 5 mLs by mouth 4 (four) times daily as needed for cough. 118 mL Logen Heintzelman-Warren, Sadie Haber, NP      PDMP not reviewed this encounter.   Abran Cantor, NP 11/25/22 1954

## 2022-11-25 NOTE — Discharge Instructions (Addendum)
Take medication as prescribed. Increase fluids and allow for plenty of rest. May take over-the-counter Tylenol or ibuprofen as needed for pain, fever, or general discomfort. Recommend using a humidifier in your bedroom at nighttime during sleep and sleeping elevated on pillows while cough symptoms persist. If you experience wheezing, shortness of breath, or become unable to speak in a complete sentence, please go to the emergency department immediately for further evaluation. Please follow-up with your primary care physician within the next 7 to 10 days for reevaluation. Follow-up as needed.

## 2022-11-25 NOTE — ED Triage Notes (Signed)
Cough, that started 3 weeks ago. Taking robitussin, mucinx, Nyquil, inhaler with no relief.

## 2022-12-09 DIAGNOSIS — Z419 Encounter for procedure for purposes other than remedying health state, unspecified: Secondary | ICD-10-CM | POA: Diagnosis not present

## 2022-12-26 ENCOUNTER — Ambulatory Visit: Payer: Medicaid Other | Admitting: Internal Medicine

## 2023-01-09 DIAGNOSIS — Z419 Encounter for procedure for purposes other than remedying health state, unspecified: Secondary | ICD-10-CM | POA: Diagnosis not present

## 2023-01-17 ENCOUNTER — Other Ambulatory Visit: Payer: Self-pay | Admitting: Internal Medicine

## 2023-01-17 DIAGNOSIS — I10 Essential (primary) hypertension: Secondary | ICD-10-CM

## 2023-02-09 ENCOUNTER — Other Ambulatory Visit: Payer: Self-pay | Admitting: Internal Medicine

## 2023-02-09 DIAGNOSIS — Z419 Encounter for procedure for purposes other than remedying health state, unspecified: Secondary | ICD-10-CM | POA: Diagnosis not present

## 2023-02-09 DIAGNOSIS — I10 Essential (primary) hypertension: Secondary | ICD-10-CM

## 2023-03-06 ENCOUNTER — Encounter: Payer: Self-pay | Admitting: Internal Medicine

## 2023-03-06 ENCOUNTER — Ambulatory Visit (INDEPENDENT_AMBULATORY_CARE_PROVIDER_SITE_OTHER): Payer: Medicaid Other | Admitting: Internal Medicine

## 2023-03-06 VITALS — BP 166/91 | HR 66 | Ht 62.0 in | Wt 174.0 lb

## 2023-03-06 DIAGNOSIS — I10 Essential (primary) hypertension: Secondary | ICD-10-CM

## 2023-03-06 DIAGNOSIS — F32A Depression, unspecified: Secondary | ICD-10-CM

## 2023-03-06 DIAGNOSIS — R5383 Other fatigue: Secondary | ICD-10-CM | POA: Diagnosis not present

## 2023-03-06 DIAGNOSIS — F419 Anxiety disorder, unspecified: Secondary | ICD-10-CM | POA: Diagnosis not present

## 2023-03-06 DIAGNOSIS — Z131 Encounter for screening for diabetes mellitus: Secondary | ICD-10-CM | POA: Diagnosis not present

## 2023-03-06 DIAGNOSIS — J4520 Mild intermittent asthma, uncomplicated: Secondary | ICD-10-CM | POA: Diagnosis not present

## 2023-03-06 DIAGNOSIS — E559 Vitamin D deficiency, unspecified: Secondary | ICD-10-CM | POA: Diagnosis not present

## 2023-03-06 DIAGNOSIS — Z1329 Encounter for screening for other suspected endocrine disorder: Secondary | ICD-10-CM

## 2023-03-06 DIAGNOSIS — Z13220 Encounter for screening for lipoid disorders: Secondary | ICD-10-CM

## 2023-03-06 DIAGNOSIS — R1013 Epigastric pain: Secondary | ICD-10-CM

## 2023-03-06 DIAGNOSIS — Z8709 Personal history of other diseases of the respiratory system: Secondary | ICD-10-CM | POA: Insufficient documentation

## 2023-03-06 DIAGNOSIS — N3000 Acute cystitis without hematuria: Secondary | ICD-10-CM

## 2023-03-06 DIAGNOSIS — K219 Gastro-esophageal reflux disease without esophagitis: Secondary | ICD-10-CM

## 2023-03-06 MED ORDER — CEPHALEXIN 500 MG PO CAPS
500.0000 mg | ORAL_CAPSULE | Freq: Two times a day (BID) | ORAL | 0 refills | Status: AC
Start: 2023-03-06 — End: 2023-03-11

## 2023-03-06 MED ORDER — ALBUTEROL SULFATE HFA 108 (90 BASE) MCG/ACT IN AERS: 2.0000 | INHALATION_SPRAY | Freq: Four times a day (QID) | RESPIRATORY_TRACT | 0 refills | Status: AC | PRN

## 2023-03-06 MED ORDER — SERTRALINE HCL 25 MG PO TABS
25.0000 mg | ORAL_TABLET | Freq: Every day | ORAL | 3 refills | Status: DC
Start: 2023-03-06 — End: 2023-07-01

## 2023-03-06 MED ORDER — LINACLOTIDE 145 MCG PO CAPS
145.0000 ug | ORAL_CAPSULE | Freq: Every day | ORAL | 3 refills | Status: AC
Start: 2023-03-06 — End: 2024-03-05

## 2023-03-06 MED ORDER — AMLODIPINE BESYLATE 5 MG PO TABS
5.0000 mg | ORAL_TABLET | Freq: Every day | ORAL | 2 refills | Status: AC
Start: 2023-03-06 — End: ?

## 2023-03-06 MED ORDER — LOSARTAN POTASSIUM 50 MG PO TABS: 50.0000 mg | ORAL_TABLET | Freq: Every day | ORAL | 2 refills | Status: DC

## 2023-03-06 MED ORDER — PANTOPRAZOLE SODIUM 40 MG PO TBEC
40.0000 mg | DELAYED_RELEASE_TABLET | Freq: Two times a day (BID) | ORAL | 5 refills | Status: AC
Start: 2023-03-06 — End: 2023-09-02

## 2023-03-06 NOTE — Assessment & Plan Note (Signed)
Asymptomatic currently.  Pulmonary exam unremarkable.  Albuterol inhaler has been refilled for as needed use

## 2023-03-06 NOTE — Assessment & Plan Note (Signed)
She has most recently been prescribed Protonix 40 mg twice daily.  This adequately controlled her symptoms.  Refill provided today.

## 2023-03-06 NOTE — Assessment & Plan Note (Signed)
She endorses lower abdominal discomfort as well as low back pain and is concerned that she may have a UTI.  States that she recently took a leftover antibiotics at home, which mildly improved her symptoms.  POC UA today is LE positive, nitrite negative. -Keflex x 5 days prescribed for treatment of UTI

## 2023-03-06 NOTE — Assessment & Plan Note (Signed)
Poorly controlled.  She was previously prescribed losartan and amlodipine.  These medications will be resumed today at their previous doses.  She will return to care in 4-6 weeks for HTN check.

## 2023-03-06 NOTE — Progress Notes (Signed)
Established Patient Office Visit  Subjective   Patient ID: Andrea Burns, female    DOB: 04/25/1986  Age: 37 y.o. MRN: [id number]  Chief Complaint  Patient presents with   Follow-up    Follow up, not taking medication, depression drinking again, requesting labs for pain all over    Andrea Burns returns to care today for follow-up.  Last evaluated by me on 3/8 for an acute visit at which time she was treated for UTI and referred to gastroenterology for evaluation of epigastric abdominal pain.  In the interim, she was evaluated by gastroenterology and underwent EGD in early May.  No acute findings were identified and Protonix was increased to 40 mg twice daily.  ED presentation on 6/17 endorsing URI symptoms.  There have otherwise been no acute interval events.  Today she reports feeling poorly.  She states that she stopped taking all of her medications due to not caring.  She started drinking alcohol heavily until stopping recently.  She would like to resume her previous medications and have labs updated.  She additionally endorses lower abdominal pain as well as low back pain and is concerned for UTI.   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   Medical history non-contributory    Pneumonia    2013   Restless leg    Vaginal Pap smear, abnormal    Past Surgical History:  Procedure Laterality Date   APPENDECTOMY     BIOPSY  10/11/2022   Procedure: BIOPSY;  Surgeon: Lanelle Bal, DO;  Location: [facility];  Service: Endoscopy;;   COLPOSCOPY W/ BIOPSY / CURETTAGE     ESOPHAGOGASTRODUODENOSCOPY (EGD) WITH PROPOFOL N/A 10/11/2022   Procedure: ESOPHAGOGASTRODUODENOSCOPY (EGD) WITH PROPOFOL;  Surgeon: Lanelle Bal, DO;  Location: [facility];  Service: Endoscopy;  Laterality: N/A;   10:30 AM;ASA 2   IUD REMOVAL     LAPAROSCOPIC APPENDECTOMY N/A 10/18/2019   Procedure: APPENDECTOMY LAPAROSCOPIC;  Surgeon: [name], MD;  Location: [facility];  Service: General;  Laterality: N/A;   LAPAROSCOPY ABDOMEN DIAGNOSTIC     Removal of migrated IUD    TUBAL LIGATION N/A 12/11/2020   Procedure: POST PARTUM TUBAL LIGATION;  Surgeon: Tereso Newcomer, MD;  Location: [facility];  Service: Gynecology;  Laterality: N/A;   Social History   Tobacco Use   Smoking status: Former    Current packs/day: 0.00    Average packs/day: 0.3 packs/day for 1 year (0.3 ttl pk-yrs)    Types: Cigarettes    Start date: 05/03/2004    Quit date: 05/03/2005    Years since quitting: 17.8   Smokeless tobacco: Never  Vaping Use   Vaping status: Never Used  Substance Use Topics   Alcohol use: Yes    Comment: Occ   Drug use: Not Currently    Types: Cocaine    Comment: last used August 2021   Family History  Problem Relation Age of Onset   Diabetes Mother    Hypertension Mother    Cancer Mother    Diabetes Father    Cancer Paternal Grandmother        liver & lung   No Known Allergies  Review of Systems  Constitutional:  Positive for  malaise/fatigue.  Gastrointestinal:  Positive for abdominal pain (Lower abdominal pain).  Genitourinary:  Negative for dysuria, flank pain, frequency and hematuria.  Musculoskeletal:  Positive for back pain (Lumbar back pain).  Psychiatric/Behavioral:  Positive for depression. The patient is nervous/anxious and has insomnia.   All other systems reviewed and are negative.    Objective:     BP (!) 166/91 (BP Location: Left Arm, Patient Position: Sitting, Cuff Size: Large)   Pulse 66   Ht 5\' 2"  (1.575 m)   Wt 174 lb (78.9 kg)   SpO2 98%   BMI 31.83 kg/m  BP Readings from Last 3 Encounters:  03/06/23 (!) 166/91  11/25/22 (!) 164/99  10/11/22 115/86   Physical Exam Vitals reviewed.  Constitutional:      General: She is not in acute distress.     Appearance: Normal appearance. She is obese. She is not toxic-appearing.  HENT:     Head: Normocephalic and atraumatic.     Right Ear: External ear normal.     Left Ear: External ear normal.     Nose: Nose normal. No congestion or rhinorrhea.     Mouth/Throat:     Mouth: Mucous membranes are moist.     Pharynx: Oropharynx is clear. No oropharyngeal exudate or posterior oropharyngeal erythema.  Eyes:     General: No scleral icterus.    Extraocular Movements: Extraocular movements intact.     Conjunctiva/sclera: Conjunctivae normal.     Pupils: Pupils are equal, round, and reactive to light.  Cardiovascular:     Rate and Rhythm: Normal rate and regular rhythm.     Pulses: Normal pulses.     Heart sounds: Normal heart sounds. No murmur heard.    No friction rub. No gallop.  Pulmonary:     Effort: Pulmonary effort is normal.     Breath sounds: Normal breath sounds. No wheezing, rhonchi or rales.  Abdominal:     General: Abdomen is flat. Bowel sounds are normal. There is no distension.     Palpations: Abdomen is soft.     Tenderness: There is no abdominal tenderness.  Musculoskeletal:        General: No swelling. Normal range of motion.     Cervical back: Normal range of motion.     Right lower leg: No edema.     Left lower leg: No edema.  Lymphadenopathy:     Cervical: No cervical adenopathy.  Skin:    General: Skin is warm and dry.     Capillary Refill: Capillary refill takes less than 2 seconds.     Coloration: Skin is not jaundiced.  Neurological:     General: No focal deficit present.     Mental Status: She is alert and oriented to person, place, and time.  Psychiatric:        Mood and Affect: Mood normal.        Behavior: Behavior normal.   Last CBC Lab Results  Component Value Date   WBC 9.3 08/16/2022   HGB 12.8 08/16/2022   HCT 38.5 08/16/2022   MCV 89 08/16/2022   MCH 29.6 08/16/2022   RDW 11.9 08/16/2022   PLT 271 08/16/2022   Last metabolic panel Lab  Results  Component Value Date   GLUCOSE 81 08/16/2022   NA 139 08/16/2022   K 3.8 08/16/2022   CL 103 08/16/2022   CO2 23 08/16/2022   BUN 11 08/16/2022   CREATININE 0.74 08/16/2022   EGFR 107 08/16/2022   CALCIUM  9.2 08/16/2022   PROT 6.6 08/16/2022   ALBUMIN 4.1 08/16/2022   LABGLOB 2.5 08/16/2022   AGRATIO 1.6 08/16/2022   BILITOT 0.5 08/16/2022   ALKPHOS 58 08/16/2022   AST 18 08/16/2022   ALT 18 08/16/2022   ANIONGAP 5 09/15/2021   Last lipids Lab Results  Component Value Date   CHOL 169 02/04/2022   HDL 38 (L) 02/04/2022   LDLCALC 102 (H) 02/04/2022   TRIG 163 (H) 02/04/2022   CHOLHDL 4.4 02/04/2022   Last hemoglobin A1c Lab Results  Component Value Date   HGBA1C 5.2 02/04/2022   Last thyroid functions Lab Results  Component Value Date   TSH 1.239 10/03/2022   T4TOTAL 8.4 10/06/2020   Last vitamin D Lab Results  Component Value Date   VD25OH 22.4 (L) 08/16/2022   Last vitamin B12 and Folate Lab Results  Component Value Date   VITAMINB12 404 10/06/2020     Assessment & Plan:   Problem List Items Addressed This Visit       Essential hypertension    Poorly controlled.  She was previously prescribed losartan and amlodipine.  These medications will be resumed today at their previous doses.  She will return to care in 4-6 weeks for HTN check.      GERD (gastroesophageal reflux disease)    She has most recently been prescribed Protonix 40 mg twice daily.  This adequately controlled her symptoms.  Refill provided today.      Acute cystitis without hematuria    She endorses lower abdominal discomfort as well as low back pain and is concerned that she may have a UTI.  States that she recently took a leftover antibiotics at home, which mildly improved her symptoms.  POC UA today is LE positive, nitrite negative. -Keflex x 5 days prescribed for treatment of UTI      Anxiety and depression - Primary    PHQ-9 and GAD-7 scores are elevated today.  She  endorses fatigue and states that she previously stopped taking her medications and drinking alcohol because she no longer cared.  Denies SI/HI.  States that hydroxyzine is not effective for as needed anxiety relief. -Through shared decision making, sertraline 25 mg daily has been started for treatment of anxiety and depression.  Okay to increase to 50 mg daily after 1 week.  She will return to care in 4-6 weeks for reassessment.      History of asthma    Asymptomatic currently.  Pulmonary exam unremarkable.  Albuterol inhaler has been refilled for as needed use      Return in about 4 weeks (around 04/03/2023) for fatigue, lab review, htn.   Billie Lade, MD

## 2023-03-06 NOTE — Assessment & Plan Note (Signed)
PHQ-9 and GAD-7 scores are elevated today.  She endorses fatigue and states that she previously stopped taking her medications and drinking alcohol because she no longer cared.  Denies SI/HI.  States that hydroxyzine is not effective for as needed anxiety relief. -Through shared decision making, sertraline 25 mg daily has been started for treatment of anxiety and depression.  Okay to increase to 50 mg daily after 1 week.  She will return to care in 4-6 weeks for reassessment.

## 2023-03-06 NOTE — Patient Instructions (Signed)
It was a pleasure to see you today.  Thank you for giving [place] the opportunity to be involved in your care.  Below is a brief recap of your visit and next steps.  We will plan to see you again in 4-6 weeks.  Summary Repeat labs today Medications refilled Start sertraline for anxiety and depression Check urine for infection Follow up in 4-6 weeks

## 2023-03-07 LAB — CMP14+EGFR
ALT: 15 [IU]/L (ref 0–32)
AST: 17 [IU]/L (ref 0–40)
Albumin: 4 g/dL (ref 3.9–4.9)
Alkaline Phosphatase: 66 [IU]/L (ref 44–121)
BUN/Creatinine Ratio: 13 (ref 9–23)
BUN: 12 mg/dL (ref 6–20)
Bilirubin Total: 0.4 mg/dL (ref 0.0–1.2)
CO2: 25 mmol/L (ref 20–29)
Calcium: 9.3 mg/dL (ref 8.7–10.2)
Chloride: 103 mmol/L (ref 96–106)
Creatinine, Ser: 0.9 mg/dL (ref 0.57–1.00)
Globulin, Total: 2.6 g/dL (ref 1.5–4.5)
Glucose: 79 mg/dL (ref 70–99)
Potassium: 4.3 mmol/L (ref 3.5–5.2)
Sodium: 139 mmol/L (ref 134–144)
Total Protein: 6.6 g/dL (ref 6.0–8.5)
eGFR: 85 mL/min/{1.73_m2} (ref >59)

## 2023-03-07 LAB — CBC WITH DIFFERENTIAL/PLATELET
Basophils Absolute: 0 10*3/uL (ref 0.0–0.2)
Basos: 0 %
EOS (ABSOLUTE): 0.2 10*3/uL (ref 0.0–0.4)
Eos: 2 %
Hematocrit: 40.4 % (ref 34.0–46.6)
Hemoglobin: 13.3 g/dL (ref 11.1–15.9)
Immature Grans (Abs): 0 10*3/uL (ref 0.0–0.1)
Immature Granulocytes: 0 %
Lymphocytes Absolute: 2.3 10*3/uL (ref 0.7–3.1)
Lymphs: 28 %
MCH: 29.6 pg (ref 26.6–33.0)
MCHC: 32.9 g/dL (ref 31.5–35.7)
MCV: 90 fL (ref 79–97)
Monocytes Absolute: 0.4 10*3/uL (ref 0.1–0.9)
Monocytes: 4 %
Neutrophils Absolute: 5.5 10*3/uL (ref 1.4–7.0)
Neutrophils: 66 %
Platelets: 286 10*3/uL (ref 150–450)
RBC: 4.5 x10E6/uL (ref 3.77–5.28)
RDW: 12.3 % (ref 11.7–15.4)
WBC: 8.3 10*3/uL (ref 3.4–10.8)

## 2023-03-07 LAB — LIPID PANEL
Chol/HDL Ratio: 4.2 {ratio} (ref 0.0–4.4)
Cholesterol, Total: 168 mg/dL (ref 100–199)
HDL: 40 mg/dL (ref >39)
LDL Chol Calc (NIH): 99 mg/dL (ref 0–99)
Triglycerides: 168 mg/dL — ABNORMAL HIGH (ref 0–149)
VLDL Cholesterol Cal: 29 mg/dL (ref 5–40)

## 2023-03-07 LAB — HEMOGLOBIN A1C
Est. average glucose Bld gHb Est-mCnc: 100 mg/dL
Hgb A1c MFr Bld: 5.1 % (ref 4.8–5.6)

## 2023-03-07 LAB — B12 AND FOLATE PANEL
Folate: 17.7 ng/mL (ref >3.0)
Vitamin B-12: 613 pg/mL (ref 232–1245)

## 2023-03-07 LAB — TSH+FREE T4
Free T4: 1.03 ng/dL (ref 0.82–1.77)
TSH: 1.43 u[IU]/mL (ref 0.450–4.500)

## 2023-03-07 LAB — VITAMIN D 25 HYDROXY (VIT D DEFICIENCY, FRACTURES): Vit D, 25-Hydroxy: 23.4 ng/mL — ABNORMAL LOW (ref 30.0–100.0)

## 2023-03-11 DIAGNOSIS — Z419 Encounter for procedure for purposes other than remedying health state, unspecified: Secondary | ICD-10-CM | POA: Diagnosis not present

## 2023-04-01 ENCOUNTER — Ambulatory Visit: Payer: Medicaid Other | Admitting: Internal Medicine

## 2023-04-02 ENCOUNTER — Encounter: Payer: Self-pay | Admitting: Internal Medicine

## 2023-04-02 ENCOUNTER — Ambulatory Visit: Payer: Medicaid Other | Admitting: Internal Medicine

## 2023-04-08 ENCOUNTER — Ambulatory Visit: Payer: Medicaid Other | Admitting: Internal Medicine

## 2023-04-11 DIAGNOSIS — Z419 Encounter for procedure for purposes other than remedying health state, unspecified: Secondary | ICD-10-CM | POA: Diagnosis not present

## 2023-05-11 DIAGNOSIS — Z419 Encounter for procedure for purposes other than remedying health state, unspecified: Secondary | ICD-10-CM | POA: Diagnosis not present

## 2023-06-11 DIAGNOSIS — Z419 Encounter for procedure for purposes other than remedying health state, unspecified: Secondary | ICD-10-CM | POA: Diagnosis not present

## 2023-07-01 ENCOUNTER — Ambulatory Visit (INDEPENDENT_AMBULATORY_CARE_PROVIDER_SITE_OTHER): Payer: Medicaid Other | Admitting: Internal Medicine

## 2023-07-01 ENCOUNTER — Encounter: Payer: Self-pay | Admitting: Internal Medicine

## 2023-07-01 ENCOUNTER — Ambulatory Visit: Payer: Self-pay | Admitting: Internal Medicine

## 2023-07-01 VITALS — BP 166/98 | HR 76 | Ht 62.0 in | Wt 187.2 lb

## 2023-07-01 DIAGNOSIS — R350 Frequency of micturition: Secondary | ICD-10-CM | POA: Diagnosis not present

## 2023-07-01 DIAGNOSIS — F419 Anxiety disorder, unspecified: Secondary | ICD-10-CM

## 2023-07-01 DIAGNOSIS — F32A Depression, unspecified: Secondary | ICD-10-CM | POA: Diagnosis not present

## 2023-07-01 DIAGNOSIS — I10 Essential (primary) hypertension: Secondary | ICD-10-CM

## 2023-07-01 MED ORDER — SERTRALINE HCL 25 MG PO TABS: 25.0000 mg | ORAL_TABLET | Freq: Every day | ORAL | 3 refills | Status: AC

## 2023-07-01 NOTE — Assessment & Plan Note (Signed)
Blood pressure remains elevated today.  She attributes this to back pain in the setting of recent fall and increased urinary frequency.  Losartan and amlodipine were resumed at her last appointment.  She has checked her blood pressure at home in the interim and reports readings that are typically within goal.  Medication changes have been deferred for now.  She will return to care in 3 months for reassessment.  Consider increasing antihypertensive medications if her blood pressure remains elevated at that time.
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
## 2023-07-01 NOTE — Assessment & Plan Note (Signed)
Her acute concern today is increased urinary frequency.  She would like to be checked for UTI.  History of multiple UTIs previously.  Last treated for UTI by me in September 2024. -UA ordered today
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
## 2023-07-01 NOTE — Patient Instructions (Signed)
It was a pleasure to see you today.  Thank you for giving [place] the opportunity to be involved in your care.  Below is a brief recap of your visit and next steps.  We will plan to see you again in 3 months.  Summary Resume sertraline 25 mg daily Check urine for infection Continue blood pressure medications as currently prescribed Follow up in 3 months

## 2023-07-03 ENCOUNTER — Encounter: Payer: Self-pay | Admitting: Internal Medicine

## 2023-07-03 LAB — URINALYSIS
Bilirubin, UA: NEGATIVE
Glucose, UA: NEGATIVE
Ketones, UA: NEGATIVE
Leukocytes,UA: NEGATIVE
Nitrite, UA: NEGATIVE
Protein,UA: NEGATIVE
Specific Gravity, UA: 1.025 (ref 1.005–1.030)
Urobilinogen, Ur: 0.2 mg/dL (ref 0.2–1.0)
pH, UA: 7 (ref 5.0–7.5)

## 2023-07-12 DIAGNOSIS — Z419 Encounter for procedure for purposes other than remedying health state, unspecified: Secondary | ICD-10-CM | POA: Diagnosis not present

## 2023-08-09 DIAGNOSIS — Z419 Encounter for procedure for purposes other than remedying health state, unspecified: Secondary | ICD-10-CM | POA: Diagnosis not present

## 2023-08-29 ENCOUNTER — Other Ambulatory Visit: Payer: Self-pay | Admitting: Internal Medicine

## 2023-08-29 DIAGNOSIS — I10 Essential (primary) hypertension: Secondary | ICD-10-CM

## 2023-09-01 DIAGNOSIS — J019 Acute sinusitis, unspecified: Secondary | ICD-10-CM | POA: Diagnosis not present

## 2023-09-01 DIAGNOSIS — N390 Urinary tract infection, site not specified: Secondary | ICD-10-CM | POA: Diagnosis not present

## 2023-09-01 DIAGNOSIS — R350 Frequency of micturition: Secondary | ICD-10-CM | POA: Diagnosis not present

## 2023-09-01 DIAGNOSIS — J069 Acute upper respiratory infection, unspecified: Secondary | ICD-10-CM | POA: Diagnosis not present

## 2023-09-20 DIAGNOSIS — Z419 Encounter for procedure for purposes other than remedying health state, unspecified: Secondary | ICD-10-CM | POA: Diagnosis not present

## 2023-10-02 ENCOUNTER — Ambulatory Visit: Payer: Medicaid Other | Admitting: Internal Medicine

## 2023-10-20 DIAGNOSIS — Z419 Encounter for procedure for purposes other than remedying health state, unspecified: Secondary | ICD-10-CM | POA: Diagnosis not present

## 2023-11-20 DIAGNOSIS — Z419 Encounter for procedure for purposes other than remedying health state, unspecified: Secondary | ICD-10-CM | POA: Diagnosis not present

## 2023-12-20 DIAGNOSIS — Z419 Encounter for procedure for purposes other than remedying health state, unspecified: Secondary | ICD-10-CM | POA: Diagnosis not present

## 2024-01-07 ENCOUNTER — Ambulatory Visit: Payer: Self-pay | Admitting: Obstetrics and Gynecology

## 2024-01-07 ENCOUNTER — Encounter: Payer: Self-pay | Admitting: Obstetrics and Gynecology

## 2024-01-07 ENCOUNTER — Ambulatory Visit: Admitting: Obstetrics and Gynecology

## 2024-01-07 ENCOUNTER — Other Ambulatory Visit (HOSPITAL_COMMUNITY)
Admission: RE | Admit: 2024-01-07 | Discharge: 2024-01-07 | Disposition: A | Discharge status: Home / Self Care | Source: Ambulatory Visit | Attending: Obstetrics and Gynecology | Admitting: Obstetrics and Gynecology

## 2024-01-07 VITALS — BP 163/109 | HR 71 | Ht 62.0 in | Wt 203.0 lb

## 2024-01-07 DIAGNOSIS — Z01419 Encounter for gynecological examination (general) (routine) without abnormal findings: Secondary | ICD-10-CM

## 2024-01-07 DIAGNOSIS — N390 Urinary tract infection, site not specified: Secondary | ICD-10-CM

## 2024-01-07 DIAGNOSIS — F419 Anxiety disorder, unspecified: Secondary | ICD-10-CM | POA: Diagnosis not present

## 2024-01-07 DIAGNOSIS — Z113 Encounter for screening for infections with a predominantly sexual mode of transmission: Secondary | ICD-10-CM

## 2024-01-07 DIAGNOSIS — N3946 Mixed incontinence: Secondary | ICD-10-CM

## 2024-01-07 DIAGNOSIS — R350 Frequency of micturition: Secondary | ICD-10-CM

## 2024-01-07 DIAGNOSIS — N930 Postcoital and contact bleeding: Secondary | ICD-10-CM

## 2024-01-07 LAB — POCT URINALYSIS DIPSTICK
Bilirubin, UA: NEGATIVE
Glucose, UA: NEGATIVE
Ketones, UA: NEGATIVE
Nitrite, UA: NEGATIVE
Protein, UA: POSITIVE — AB
Spec Grav, UA: >=1.03 — AB (ref 1.010–1.025)
Urobilinogen, UA: 0.2 U/dL
pH, UA: 5 (ref 5.0–8.0)

## 2024-01-07 MED ORDER — CEFADROXIL 500 MG PO CAPS: 500.0000 mg | ORAL_CAPSULE | Freq: Two times a day (BID) | ORAL | 0 refills | Status: AC

## 2024-01-07 NOTE — Progress Notes (Signed)
 GYNECOLOGY ANNUAL PREVENTATIVE CARE ENCOUNTER NOTE  History:     Andrea Burns is a 38 y.o. [phone] female here for a routine annual gynecologic exam.  Current complaints: worsening mood, lower abdominal pain.   Denies abnormal vaginal bleeding, discharge.   Menses are regular lasting 3-5 days.   Gynecologic History Patient's last menstrual period was 11/23/2023 (exact date). Contraception: tubal ligation Last Pap: 08/01/22. Results were: normal with negative HPV Last mammogram: n/a  Obstetric History OB History  Gravida Para Term Preterm AB Living  6 5 5  1 5   SAB IAB Ectopic Multiple Live Births  1   0 5    # Outcome Date GA Lbr Len/2nd Weight Sex Type Anes PTL Lv  6 Term 12/11/20 [redacted]w[redacted]d 02:17 6 lb 12.4 oz (3.073 kg) F Vag-Spont None  LIV  5 Term 05/10/13 [redacted]w[redacted]d 35:30 / 00:36 6 lb 15.3 oz (3.155 kg) M Vag-Spont Local  LIV  4 Term 04/23/09 [redacted]w[redacted]d  8 lb (3.629 kg) M Vag-Spont None  LIV     Birth Comments: No complications  3 Term 02/28/07 [redacted]w[redacted]d  7 lb 5 oz (3.317 kg) M Vag-Spont None  LIV     Birth Comments: No complications  2 Term 05/16/04 [redacted]w[redacted]d  7 lb 11 oz (3.487 kg) F Vag-Spont None  LIV     Birth Comments: No complications  1 SAB             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   Medical history non-contributory    Pneumonia    2013   Restless leg    Vaginal Pap smear, abnormal     Past Surgical History:  Procedure Laterality Date   APPENDECTOMY     BIOPSY  10/11/2022   Procedure: BIOPSY;  Surgeon: Cindie Carlin POUR, DO;  Location: [facility];  Service: Endoscopy;;   COLPOSCOPY W/ BIOPSY / CURETTAGE     ESOPHAGOGASTRODUODENOSCOPY (EGD) WITH PROPOFOL  N/A 10/11/2022   Procedure: ESOPHAGOGASTRODUODENOSCOPY (EGD) WITH PROPOFOL ;  Surgeon: Cindie Carlin POUR, DO;   Location: [facility];  Service: Endoscopy;  Laterality: N/A;  10:30 AM;ASA 2   IUD REMOVAL     LAPAROSCOPIC APPENDECTOMY N/A 10/18/2019   Procedure: APPENDECTOMY LAPAROSCOPIC;  Surgeon: Kallie Manuelita BROCKS, MD;  Location: [facility];  Service: General;  Laterality: N/A;   LAPAROSCOPY ABDOMEN DIAGNOSTIC     Removal of migrated IUD    TUBAL LIGATION N/A 12/11/2020   Procedure: POST PARTUM TUBAL LIGATION;  Surgeon: Herchel Gloris DELENA, MD;  Location: [facility];  Service: Gynecology;  Laterality: N/A;    Current Outpatient Medications on File Prior to Visit  Medication Sig Dispense Refill   albuterol  (VENTOLIN  HFA) 108 (90 Base) MCG/ACT inhaler Inhale 2 puffs into the lungs every 6 (six) hours as needed for wheezing or shortness of breath. 8 g 0   amLODipine  (NORVASC ) 5 MG tablet Take 1 tablet (5 mg total) by mouth daily. 30 tablet 2   linaclotide  (LINZESS ) 145 MCG CAPS capsule Take 1 capsule (145 mcg total) by mouth daily before breakfast. 90 capsule 3   losartan  (COZAAR ) 50 MG tablet TAKE 1 TABLET(50 MG) BY MOUTH DAILY 30 tablet 2   pantoprazole  (PROTONIX ) 40 MG tablet Take  1 tablet (40 mg total) by mouth 2 (two) times daily. 60 tablet 5   sertraline  (ZOLOFT ) 25 MG tablet Take 1 tablet (25 mg total) by mouth daily. (Patient not taking: Reported on 01/07/2024) 30 tablet 3   No current facility-administered medications on file prior to visit.    No Known Allergies  Social History:  reports that she quit smoking about 18 years ago. Her smoking use included cigarettes. She started smoking about 19 years ago. She has a 0.3 pack-year smoking history. She uses smokeless tobacco. She reports current alcohol use. She reports current drug use. Drug: Cocaine.  Family History  Problem Relation Age of Onset   Diabetes Mother    Hypertension Mother    Cancer Mother    Diabetes Father    Cancer Paternal Grandmother        liver & lung    The following portions of the patient's history were reviewed and  updated as appropriate: allergies, current medications, past family history, past medical history, past social history, past surgical history and problem list.  Review of Systems Pertinent items noted in HPI and remainder of comprehensive ROS otherwise negative.  Physical Exam:  BP (!) 163/109 (BP Location: Left Arm, Cuff Size: Large)   Pulse 71   Ht 5' 2 (1.575 m)   Wt 203 lb (92.1 kg)   LMP 11/23/2023 (Exact Date)   BMI 37.13 kg/m  CONSTITUTIONAL: Well-developed, well-nourished female in no acute distress.  HENT:  Normocephalic, atraumatic, External right and left ear normal. Oropharynx is clear and moist EYES: Conjunctivae and EOM are normal.  NECK: Normal range of motion, supple, no masses.  Normal thyroid .  SKIN: Skin is warm and dry. No rash noted. Not diaphoretic. No erythema. No pallor. MUSCULOSKELETAL: Normal range of motion. No tenderness.  No cyanosis, clubbing, or edema.  2+ distal pulses. NEUROLOGIC: Alert and oriented to person, place, and time. Normal reflexes, muscle tone coordination.  PSYCHIATRIC: Normal mood and affect. Normal behavior. Normal judgment and thought content. CARDIOVASCULAR: Normal heart rate noted, regular rhythm RESPIRATORY: Clear to auscultation bilaterally. Effort and breath sounds normal, no problems with respiration noted. BREASTS: Symmetric in size. No masses, tenderness, skin changes, nipple drainage, or lymphadenopathy bilaterally. Performed in the presence of a chaperone. ABDOMEN: Soft, no distention noted.  Mild suprapubic tenderness.  PELVIC: Normal appearing external genitalia and urethral meatus; normal appearing vaginal mucosa and cervix.  No abnormal discharge noted.  Pap smear obtained.  Normal uterine size, no other palpable masses, no uterine or adnexal tenderness.  Performed in the presence of a chaperone.   Assessment and Plan:    1. Women's annual routine gynecological examination [Z01.419] (Primary) Normal annual exam  -  Cervicovaginal ancillary only( [place])  2. Routine screening for STI (sexually transmitted infection) Per pt request - Cervicovaginal ancillary only( [place]) - HIV antibody (with reflex) - Hepatitis B surface antigen - Hepatitis C antibody - RPR  3. Frequent UTI  - POCT Urinalysis Dipstick - Urine Culture - cefadroxil  (DURICEF) 500 MG capsule; Take 1 capsule (500 mg total) by mouth 2 (two) times daily for 7 days.  Dispense: 14 capsule; Refill: 0  4. Mixed stress and urge urinary incontinence Will refer to urogynecology for further evaluation and treatment. Pt notes nocturia x 6 and loss of urine with sneezing or straining.  She has to change positions to finish voiding occasionally.  - Urine Culture - Ambulatory referral to Urogynecology  5. Postcoital bleeding No obvious source of postcoital  bleeding, advised proper pre stimulation as well as use of lubrication as needed.  Cervix is normal and does not appear friable.  Pt desired pelvic ultrasound for further evaluation - US  PELVIC COMPLETE WITH TRANSVAGINAL; Future  6. Anxiety Initial eval for mood changes and possible depression/anxiety - Ambulatory referral to [facility] Behavior[facility]  7. Urinary frequency Urine culture pending, blood and leucocytes noted on dip, will treat empirically  - cefadroxil  (DURICEF) 500 MG capsule; Take 1 capsule (500 mg total) by mouth 2 (two) times daily for 7 days.  Dispense: 14 capsule; Refill: 0  Will follow up results of pap smear and manage accordingly. Routine preventative health maintenance measures emphasized. Please refer to After Visit Summary for other counseling recommendations.      Jerilynn Buddle, MD, FACOG Obstetrician & Gynecologist, [facility] for [facility], [facility]

## 2024-01-07 NOTE — Progress Notes (Addendum)
 38 y.o. GYN presents for AEX/STD screening. C/o lower abdominal pain 8/10 x 2 weeks, bleeding after sex, mood swings, decreased libido.  BP Elevated today.    PHQ-9=15  //  GAD-7=16 Pt refused referral.

## 2024-01-08 ENCOUNTER — Ambulatory Visit: Payer: Self-pay | Admitting: Licensed Clinical Social Worker

## 2024-01-08 LAB — HEPATITIS C ANTIBODY: Hep C Virus Ab: NONREACTIVE

## 2024-01-08 LAB — RPR: RPR Ser Ql: NONREACTIVE

## 2024-01-08 LAB — HEPATITIS B SURFACE ANTIGEN: Hepatitis B Surface Ag: NEGATIVE

## 2024-01-08 LAB — HIV ANTIBODY (ROUTINE TESTING W REFLEX): HIV Screen 4th Generation wRfx: NONREACTIVE

## 2024-01-08 NOTE — BH Specialist Note (Signed)
 Virtual link sent to patient on this date. Phone call was provided. Patient no showed for today's visit.

## 2024-01-09 LAB — CERVICOVAGINAL ANCILLARY ONLY
Bacterial Vaginitis (gardnerella): NEGATIVE
Candida Glabrata: NEGATIVE
Candida Vaginitis: NEGATIVE
Chlamydia: NEGATIVE
Comment: NEGATIVE
Comment: NEGATIVE
Comment: NEGATIVE
Comment: NEGATIVE
Comment: NEGATIVE
Comment: NORMAL
Neisseria Gonorrhea: NEGATIVE
Trichomonas: NEGATIVE

## 2024-01-09 LAB — URINE CULTURE

## 2024-01-14 ENCOUNTER — Ambulatory Visit (HOSPITAL_COMMUNITY)

## 2024-01-19 ENCOUNTER — Ambulatory Visit (HOSPITAL_COMMUNITY)
Admission: RE | Admit: 2024-01-19 | Discharge: 2024-01-19 | Disposition: A | Discharge status: Home / Self Care | Source: Ambulatory Visit | Attending: Obstetrics and Gynecology | Admitting: Obstetrics and Gynecology

## 2024-01-19 DIAGNOSIS — N930 Postcoital and contact bleeding: Secondary | ICD-10-CM | POA: Diagnosis not present

## 2024-01-20 DIAGNOSIS — Z419 Encounter for procedure for purposes other than remedying health state, unspecified: Secondary | ICD-10-CM | POA: Diagnosis not present

## 2024-02-05 DIAGNOSIS — J069 Acute upper respiratory infection, unspecified: Secondary | ICD-10-CM | POA: Diagnosis not present

## 2024-02-05 DIAGNOSIS — J019 Acute sinusitis, unspecified: Secondary | ICD-10-CM | POA: Diagnosis not present

## 2024-02-05 DIAGNOSIS — J029 Acute pharyngitis, unspecified: Secondary | ICD-10-CM | POA: Diagnosis not present

## 2024-02-05 DIAGNOSIS — J209 Acute bronchitis, unspecified: Secondary | ICD-10-CM | POA: Diagnosis not present

## 2024-02-20 DIAGNOSIS — Z419 Encounter for procedure for purposes other than remedying health state, unspecified: Secondary | ICD-10-CM | POA: Diagnosis not present

## 2024-03-16 ENCOUNTER — Other Ambulatory Visit (HOSPITAL_BASED_OUTPATIENT_CLINIC_OR_DEPARTMENT_OTHER): Payer: Self-pay

## 2024-04-16 ENCOUNTER — Ambulatory Visit: Admitting: Obstetrics and Gynecology

## 2024-04-16 DIAGNOSIS — F329 Major depressive disorder, single episode, unspecified: Secondary | ICD-10-CM | POA: Insufficient documentation

## 2024-04-16 DIAGNOSIS — E785 Hyperlipidemia, unspecified: Secondary | ICD-10-CM | POA: Insufficient documentation

## 2024-04-16 DIAGNOSIS — K5900 Constipation, unspecified: Secondary | ICD-10-CM | POA: Insufficient documentation

## 2024-04-21 DIAGNOSIS — Z419 Encounter for procedure for purposes other than remedying health state, unspecified: Secondary | ICD-10-CM | POA: Diagnosis not present
